# Patient Record
Sex: Male | Born: 1950 | Race: White | Hispanic: No | Marital: Married | Smoking: Never smoker
Health system: Southern US, Community
[De-identification: ages and names within clinical notes are randomized; demographics above are authoritative.]

## PROBLEM LIST (undated history)

## (undated) DIAGNOSIS — T4145XA Adverse effect of unspecified anesthetic, initial encounter: Secondary | ICD-10-CM

## (undated) DIAGNOSIS — E119 Type 2 diabetes mellitus without complications: Secondary | ICD-10-CM

## (undated) DIAGNOSIS — R251 Tremor, unspecified: Secondary | ICD-10-CM

## (undated) DIAGNOSIS — F419 Anxiety disorder, unspecified: Secondary | ICD-10-CM

## (undated) DIAGNOSIS — C801 Malignant (primary) neoplasm, unspecified: Secondary | ICD-10-CM

## (undated) DIAGNOSIS — T8859XA Other complications of anesthesia, initial encounter: Secondary | ICD-10-CM

## (undated) DIAGNOSIS — Z8489 Family history of other specified conditions: Secondary | ICD-10-CM

## (undated) DIAGNOSIS — F32A Depression, unspecified: Secondary | ICD-10-CM

## (undated) DIAGNOSIS — G629 Polyneuropathy, unspecified: Secondary | ICD-10-CM

## (undated) DIAGNOSIS — I2510 Atherosclerotic heart disease of native coronary artery without angina pectoris: Secondary | ICD-10-CM

## (undated) DIAGNOSIS — I10 Essential (primary) hypertension: Secondary | ICD-10-CM

## (undated) DIAGNOSIS — N186 End stage renal disease: Secondary | ICD-10-CM

## (undated) DIAGNOSIS — M1990 Unspecified osteoarthritis, unspecified site: Secondary | ICD-10-CM

## (undated) DIAGNOSIS — F329 Major depressive disorder, single episode, unspecified: Secondary | ICD-10-CM

## (undated) HISTORY — PX: CORONARY ARTERY BYPASS GRAFT: SHX141

## (undated) HISTORY — DX: Type 2 diabetes mellitus without complications: E11.9

## (undated) HISTORY — DX: Essential (primary) hypertension: I10

## (undated) HISTORY — DX: Polyneuropathy, unspecified: G62.9

## (undated) HISTORY — DX: Major depressive disorder, single episode, unspecified: F32.9

## (undated) HISTORY — PX: CARDIAC SURGERY: SHX584

## (undated) HISTORY — DX: Anxiety disorder, unspecified: F41.9

## (undated) HISTORY — DX: Depression, unspecified: F32.A

---

## 2007-02-22 ENCOUNTER — Encounter: Admission: RE | Admit: 2007-02-22 | Discharge: 2007-02-22 | Payer: Self-pay | Admitting: Family Medicine

## 2009-12-06 ENCOUNTER — Ambulatory Visit: Payer: Self-pay | Admitting: Cardiovascular Disease

## 2009-12-07 ENCOUNTER — Encounter: Payer: Self-pay | Admitting: Cardiovascular Disease

## 2009-12-07 ENCOUNTER — Ambulatory Visit: Payer: Self-pay | Admitting: Thoracic Surgery (Cardiothoracic Vascular Surgery)

## 2009-12-07 ENCOUNTER — Inpatient Hospital Stay (HOSPITAL_COMMUNITY): Admission: EM | Admit: 2009-12-07 | Discharge: 2009-12-16 | Payer: Self-pay | Admitting: Emergency Medicine

## 2009-12-08 ENCOUNTER — Ambulatory Visit: Payer: Self-pay | Admitting: Vascular Surgery

## 2009-12-11 ENCOUNTER — Encounter: Payer: Self-pay | Admitting: Internal Medicine

## 2010-01-03 ENCOUNTER — Encounter
Admission: RE | Admit: 2010-01-03 | Discharge: 2010-01-03 | Payer: Self-pay | Admitting: Thoracic Surgery (Cardiothoracic Vascular Surgery)

## 2010-01-03 ENCOUNTER — Encounter: Payer: Self-pay | Admitting: Internal Medicine

## 2010-01-03 ENCOUNTER — Ambulatory Visit: Payer: Self-pay | Admitting: Thoracic Surgery (Cardiothoracic Vascular Surgery)

## 2010-01-17 DIAGNOSIS — I2510 Atherosclerotic heart disease of native coronary artery without angina pectoris: Secondary | ICD-10-CM | POA: Insufficient documentation

## 2010-01-17 DIAGNOSIS — E1140 Type 2 diabetes mellitus with diabetic neuropathy, unspecified: Secondary | ICD-10-CM | POA: Insufficient documentation

## 2010-01-18 ENCOUNTER — Ambulatory Visit: Payer: Self-pay | Admitting: Internal Medicine

## 2010-01-18 ENCOUNTER — Encounter: Payer: Self-pay | Admitting: Internal Medicine

## 2010-01-18 DIAGNOSIS — I10 Essential (primary) hypertension: Secondary | ICD-10-CM | POA: Insufficient documentation

## 2010-01-18 DIAGNOSIS — E785 Hyperlipidemia, unspecified: Secondary | ICD-10-CM

## 2010-04-15 ENCOUNTER — Encounter: Payer: Self-pay | Admitting: Family Medicine

## 2010-04-24 NOTE — Assessment & Plan Note (Signed)
Summary: eph. post cabg/g d   Visit Type:  EPH Primary Odette Watanabe:  Joseph Mcdonald  CC:  CABG x 5  11/2009....chest soreness.....sob.Marland Kitchen..Marland Kitchenno edema.....  History of Present Illness: Patient is a 60 year old who I saw in the hosptial earlier this fall.  He presented to the ER with unstable angina.  He went on to cardiac cath that showed severe 3 v disease (LAD:  75%;  Diag x 2  with 99% lesions; AV groove art 100%.  OM1 filled via collaterals; RCA 99%  PDA/PLSA filled via collaterals).  He underwent CABG (LIMA to LAD; SVG to ramus/OM1; SVG to PDA, PLSA. SInce D/C he is recuperating fairly well.  He denies signif chest pains (only musculoskel).  Breathing is OK.    Current Medications (verified): 1)  Neurontin 800 Mg Tabs (Gabapentin) .Marland Kitchen.. 1 Tab Four Times Daily 2)  Lipitor 40 Mg Tabs (Atorvastatin Calcium) .Marland Kitchen.. 1 Tab At Bedtime 3)  Glucophage 1000 Mg Tabs (Metformin Hcl) .Marland Kitchen.. 1 Tab Two Times A Day 4)  Metoprolol Tartrate 25 Mg Tabs (Metoprolol Tartrate) .Marland Kitchen.. 1 Tab Two Times A Day 5)  Hydrochlorothiazide 25 Mg Tabs (Hydrochlorothiazide) .Marland Kitchen.. 1 Tab Once Daily 6)  Alprazolam 1 Mg Tabs (Alprazolam) .Marland Kitchen.. 1 Tab Three Times A Day 7)  Aspirin Ec 325 Mg Tbec (Aspirin) .... Take One Tablet By Mouth Daily  Allergies: 1)  ! Pcn  Past History:  Past medical, surgical, family and social histories (including risk factors) reviewed, and no changes noted (except as noted below).  Past Medical History: Reviewed history from 01/17/2010 and no changes required. Severe three-vessel coronary artery disease Insulin-dependent diabetes mellitus, diagnosis at age of 22 Hypertension Peripheral neuropathy Anxiety  Past Surgical History: Reviewed history from 01/17/2010 and no changes required. Coronary artery bypass grafting x5 (left internal mammary artery to       the LAD, saphenous vein graft sequentially to the ramus intermedius       and first obtuse marginal, sequential saphenous vein graft to the   posterior descending and posterolateral arteries)  Status post right rotator cuff repair Endoscopic vein harvest left leg  Family History: Reviewed history from 01/17/2010 and no changes required. Significant for MI father at a young age.  He had a brother who died at 30, another brother who had bypass surgery but is alive at 67.      Social History: Reviewed history from 01/17/2010 and no changes required. The patient lives in [place] with his wife.   He has three children and six grandchildren.  He is disabled from a landscaping business.   He denies any tobacco use.   He used to drink alcohol daily, but this indents 9 years ago.   The patient also admits to using marijuana, this helped his back pain. The patient denies any exercise, special diet or herbal medication use.      Review of Systems       All systems reviewed.  Neg to the above problem except as noted above.  Vital Signs:  Patient profile:   60 year old male Height:      64 inches Weight:      164.12 pounds BMI:     28.27 Pulse rate:   64 / minute Pulse rhythm:   irregular BP sitting:   184 / 100  (left arm) Cuff size:   regular  Vitals Entered By: Danielle Rankin, CMA (January 18, 2010 12:16 PM)  Physical Exam  Additional Exam:  Patient is in NAD HEENT:  Normocephalic, atraumatic. EOMI, PERRLA.  Neck: JVP is normal. No thyromegaly. No bruits.  Lungs: clear to auscultation. No rales no wheezes.  Chest:  Healing well. Heart: Regular rate and rhythm. Normal S1, S2. No S3.   No significant murmurs. PMI not displaced.  Abdomen:  Supple, nontender. Normal bowel sounds. No masses. No hepatomegaly.  Extremities:   Good distal pulses throughout. No lower extremity edema.  Musculoskeletal :moving all extremities.  Neuro:   alert and oriented x3.    EKG  Procedure date:  01/19/2010  Findings:      NSR.  PACs  Impression & Recommendations:  Problem # 1:  CAD (ICD-414.00) patient is doing well post CABG.   He is not interested in cardiac rehab.  Would like to do on own  Problem # 2:  HYPERLIPIDEMIA-MIXED (ICD-272.4) Will need to f/u lipids on this regimen.   His updated medication list for this problem includes:    Lipitor 40 Mg Tabs (Atorvastatin calcium) .Marland Kitchen... 1 tab at bedtime  Problem # 3:  HYPERTENSION, BENIGN (ICD-401.1) BP is out of control today.  He says that it is because he is in the doctor's office I have asked him to check his bp at home regularly and let us know.  He als needs to get bp cuff checked to make sure it is accurate I would not change meds today. His updated medication list for this problem includes:    Metoprolol Tartrate 25 Mg Tabs (Metoprolol tartrate) .Marland Kitchen... 1 tab two times a day    Hydrochlorothiazide 25 Mg Tabs (Hydrochlorothiazide) .Marland Kitchen... 1 tab once daily    Aspirin Ec 325 Mg Tbec (Aspirin) .Marland Kitchen... Take one tablet by mouth daily  Other Orders: EKG w/ Interpretation (93000) EKG w/ Interpretation (93000)  Patient Instructions: 1)  Your physician recommends that you schedule a follow-up appointment in: END OF JANUARY 2012 2)  Your physician recommends that you continue on your current medications as directed. Please refer to the Current Medication list given to you today. 3)  Your physician has requested that you regularly monitor and record your blood pressure readings at home.  Please use the same machine at the same time of day to check your readings and record them to bring to your follow-up visit.

## 2010-04-24 NOTE — Letter (Signed)
Summary: [facility] Office Visit   [facility] Office Visit   Imported By: Roderic Ovens 01/16/2010 14:18:29  _____________________________________________________________________  External Attachment:    Type:   Image     Comment:   External Document

## 2010-04-24 NOTE — Op Note (Signed)
Summary: [facility]  [facility]   Imported By: Marylou Mccoy 12/26/2009 09:34:45  _____________________________________________________________________  External Attachment:    Type:   Image     Comment:   External Document

## 2010-04-24 NOTE — Consult Note (Signed)
Summary: [facility]   [facility]   Imported By: Roderic Ovens 01/09/2010 14:51:02  _____________________________________________________________________  External Attachment:    Type:   Image     Comment:   External Document

## 2010-04-26 ENCOUNTER — Ambulatory Visit (INDEPENDENT_AMBULATORY_CARE_PROVIDER_SITE_OTHER): Payer: Medicaid Other | Admitting: Internal Medicine

## 2010-04-26 ENCOUNTER — Ambulatory Visit: Admit: 2010-04-26 | Payer: Self-pay | Admitting: Internal Medicine

## 2010-04-26 ENCOUNTER — Encounter: Payer: Self-pay | Admitting: Internal Medicine

## 2010-04-26 DIAGNOSIS — I2510 Atherosclerotic heart disease of native coronary artery without angina pectoris: Secondary | ICD-10-CM

## 2010-04-26 DIAGNOSIS — E7800 Pure hypercholesterolemia, unspecified: Secondary | ICD-10-CM

## 2010-05-02 NOTE — Assessment & Plan Note (Signed)
Summary: ROV/D.Miller   Vital Signs:  Patient profile:   60 year old male Height:      64 inches Weight:      173 pounds BMI:     29.80 Pulse rate:   56 / minute Pulse rhythm:   regular Resp:     18 per minute BP sitting:   120 / 80  (left arm) Cuff size:   large  Vitals Entered By: Vikki Ports (April 26, 2010 10:20 AM)  Visit Type:  Follow-up Primary Provider:  Randa Evens [facility]  CC:  No complaints.  History of Present Illness: Patient is a 60 year old who I saw in the hosptial earlier this fall.  He presented to the ER with unstable angina.  He went on to cardiac cath that showed severe 3 v disease (LAD:  75%;  Diag x 2  with 99% lesions; AV groove art 100%.  OM1 filled via collaterals; RCA 99%  PDA/PLSA filled via collaterals).  He underwent CABG (LIMA to LAD; SVG to ramus/OM1; SVG to PDA, PLSA. SInce I saw him he has done well.  He denies chest pain.  Breathing is good.  He had some LE swelling and his primary MD prescribed Lasix 20 to take as needed.   Current Medications (verified): 1)  Neurontin 800 Mg Tabs (Gabapentin) .Marland Kitchen.. 1 Tab Four Times Daily 2)  Lipitor 40 Mg Tabs (Atorvastatin Calcium) .Marland Kitchen.. 1 Tab At Bedtime 3)  Glucophage 1000 Mg Tabs (Metformin Hcl) .Marland Kitchen.. 1 Tab Two Times A Day 4)  Metoprolol Tartrate 25 Mg Tabs (Metoprolol Tartrate) .Marland Kitchen.. 1 Tab Daily 5)  Hydrochlorothiazide 25 Mg Tabs (Hydrochlorothiazide) .Marland Kitchen.. 1 Tab Once Daily 6)  Alprazolam 1 Mg Tabs (Alprazolam) .Marland Kitchen.. 1 Tab Three Times A Day 7)  Aspirin Ec 325 Mg Tbec (Aspirin) .... Take One Tablet By Mouth Daily 8)  Furosemide 20 Mg Tabs (Furosemide) .... Take One Tablet By Mouth Daily. As Needed 9)  Glipizide 10 Mg Xr24h-Tab (Glipizide) .... Take 1 Tablet By Mouth Two Times A Day  Allergies: 1)  ! Pcn  Past History:  Past medical, surgical, family and social histories (including risk factors) reviewed, and no changes noted (except as noted below).  Past Medical History: Reviewed history from 01/17/2010  and no changes required. Severe three-vessel coronary artery disease Insulin-dependent diabetes mellitus, diagnosis at age of 81 Hypertension Peripheral neuropathy Anxiety  Past Surgical History: Reviewed history from 01/17/2010 and no changes required. Coronary artery bypass grafting x5 (left internal mammary artery to       the LAD, saphenous vein graft sequentially to the ramus intermedius       and first obtuse marginal, sequential saphenous vein graft to the       posterior descending and posterolateral arteries)  Status post right rotator cuff repair Endoscopic vein harvest left leg  Family History: Reviewed history from 01/17/2010 and no changes required. Significant for MI father at a young age.  He had a brother who died at 60, another brother who had bypass surgery but is alive at 12.      Social History: Reviewed history from 01/17/2010 and no changes required. The patient lives in [place] with his wife.   He has three children and six grandchildren.  He is disabled from a landscaping business.   He denies any tobacco use.   He used to drink alcohol daily, but this indents 9 years ago.   The patient also admits to using marijuana, this helped his back pain. The  patient denies any exercise, special diet or herbal medication use.      Review of Systems       All systmes reviewed.  Neg to the above problem except as noted above.  Physical Exam  Additional Exam:  Patient is in NAD HEENT:  Normocephalic, atraumatic. EOMI, PERRLA.  Neck: JVP is normal. No thyromegaly. No bruits.  Lungs: clear to auscultation. No rales no wheezes.  Heart: Regular rate and rhythm. Normal S1, S2. No S3.   No significant murmurs. PMI not displaced.  Abdomen:  Supple, nontender. Normal bowel sounds. No masses. No hepatomegaly.  Extremities:   Good distal pulses throughout. trivial lower extremity edema.  Musculoskeletal :moving all extremities.  Neuro:   alert and oriented  x3.    Impression & Recommendations:  Problem # 1:  CAD (ICD-414.00) s/p CABG this fall  Doing well.  Keep on same regimen. I am not convinced of signif volume overload.  I think there is triv LE edema prob due to venous insuff.  Can always tie shoes.  Goes down in AM  Will check labs.  Encouraged him to use support socks. His updated medication list for this problem includes:    Metoprolol Tartrate 25 Mg Tabs (Metoprolol tartrate) .Marland Kitchen... 1 tab daily    Aspirin Ec 325 Mg Tbec (Aspirin) .Marland Kitchen... Take one tablet by mouth daily  Orders: EKG w/ Interpretation (93000) EKG w/ Interpretation (93000)  Problem # 2:  HYPERTENSION, BENIGN (ICD-401.1) Good control.  Check labs. His updated medication list for this problem includes:    Metoprolol Tartrate 25 Mg Tabs (Metoprolol tartrate) .Marland Kitchen... 1 tab daily    Hydrochlorothiazide 25 Mg Tabs (Hydrochlorothiazide) .Marland Kitchen... 1 tab once daily    Aspirin Ec 325 Mg Tbec (Aspirin) .Marland Kitchen... Take one tablet by mouth daily    Furosemide 20 Mg Tabs (Furosemide) .Marland Kitchen... Take one tablet by mouth daily. as needed  Problem # 3:  HYPERLIPIDEMIA-MIXED (ICD-272.4) Will check fasting lipids and AST His updated medication list for this problem includes:    Lipitor 40 Mg Tabs (Atorvastatin calcium) .Marland Kitchen... 1 tab at bedtime  New Orders:     1)  EKG w/ Interpretation (93000)     2)  EKG w/ Interpretation (93000)   Patient Instructions: 1)  Your physician recommends that you return for a FASTING lipid profile: lab work at [facility] office  lipid ast bnp bmet 414.0 272.2 2)  Your physician wants you to follow-up in:   August 2012 with Dr,Deisy Ozbun You will receive a reminder letter in the mail two months in advance. If you don't receive a letter, please call our office to schedule the follow-up appointment.   Orders Added: 1)  EKG w/ Interpretation [93000] 2)  EKG w/ Interpretation [93000]  Appended Document: ROV/D.Miller EKG:  Sinus bradycardia.   56 bpm. Nonspecific IVCD.

## 2010-06-07 LAB — CBC
HCT: 24.3 % — ABNORMAL LOW (ref 39.0–52.0)
HCT: 26.1 % — ABNORMAL LOW (ref 39.0–52.0)
HCT: 26.6 % — ABNORMAL LOW (ref 39.0–52.0)
HCT: 27 % — ABNORMAL LOW (ref 39.0–52.0)
HCT: 34.5 % — ABNORMAL LOW (ref 39.0–52.0)
HCT: 35.8 % — ABNORMAL LOW (ref 39.0–52.0)
HCT: 36.8 % — ABNORMAL LOW (ref 39.0–52.0)
Hemoglobin: 12.2 g/dL — ABNORMAL LOW (ref 13.0–17.0)
Hemoglobin: 12.5 g/dL — ABNORMAL LOW (ref 13.0–17.0)
Hemoglobin: 9.4 g/dL — ABNORMAL LOW (ref 13.0–17.0)
MCH: 30.4 pg (ref 26.0–34.0)
MCH: 30.5 pg (ref 26.0–34.0)
MCH: 30.7 pg (ref 26.0–34.0)
MCH: 30.8 pg (ref 26.0–34.0)
MCH: 31 pg (ref 26.0–34.0)
MCH: 31.2 pg (ref 26.0–34.0)
MCH: 31.5 pg (ref 26.0–34.0)
MCHC: 35.3 g/dL (ref 30.0–36.0)
MCHC: 35.4 g/dL (ref 30.0–36.0)
MCHC: 35.5 g/dL (ref 30.0–36.0)
MCHC: 36 g/dL (ref 30.0–36.0)
MCV: 86.4 fL (ref 78.0–100.0)
MCV: 86.5 fL (ref 78.0–100.0)
MCV: 87.2 fL (ref 78.0–100.0)
MCV: 87.7 fL (ref 78.0–100.0)
MCV: 88 fL (ref 78.0–100.0)
MCV: 89.3 fL (ref 78.0–100.0)
MCV: 89.4 fL (ref 78.0–100.0)
Platelets: 135 10*3/uL — ABNORMAL LOW (ref 150–400)
Platelets: 160 10*3/uL (ref 150–400)
Platelets: 177 10*3/uL (ref 150–400)
Platelets: 230 10*3/uL (ref 150–400)
Platelets: 232 10*3/uL (ref 150–400)
Platelets: 240 10*3/uL (ref 150–400)
RBC: 2.92 MIL/uL — ABNORMAL LOW (ref 4.22–5.81)
RBC: 2.99 MIL/uL — ABNORMAL LOW (ref 4.22–5.81)
RBC: 3.05 MIL/uL — ABNORMAL LOW (ref 4.22–5.81)
RBC: 3.08 MIL/uL — ABNORMAL LOW (ref 4.22–5.81)
RBC: 4.07 MIL/uL — ABNORMAL LOW (ref 4.22–5.81)
RDW: 12.7 % (ref 11.5–15.5)
RDW: 12.8 % (ref 11.5–15.5)
RDW: 12.8 % (ref 11.5–15.5)
RDW: 12.9 % (ref 11.5–15.5)
RDW: 13 % (ref 11.5–15.5)
RDW: 13.1 % (ref 11.5–15.5)
RDW: 13.1 % (ref 11.5–15.5)
RDW: 13.2 % (ref 11.5–15.5)
WBC: 12.9 10*3/uL — ABNORMAL HIGH (ref 4.0–10.5)
WBC: 14.4 10*3/uL — ABNORMAL HIGH (ref 4.0–10.5)
WBC: 6.2 10*3/uL (ref 4.0–10.5)
WBC: 7.1 10*3/uL (ref 4.0–10.5)
WBC: 8.4 10*3/uL (ref 4.0–10.5)
WBC: 9.7 10*3/uL (ref 4.0–10.5)

## 2010-06-07 LAB — BASIC METABOLIC PANEL
BUN: 12 mg/dL (ref 6–23)
BUN: 14 mg/dL (ref 6–23)
BUN: 14 mg/dL (ref 6–23)
BUN: 17 mg/dL (ref 6–23)
BUN: 17 mg/dL (ref 6–23)
CO2: 24 mEq/L (ref 19–32)
CO2: 24 mEq/L (ref 19–32)
CO2: 25 mEq/L (ref 19–32)
CO2: 28 mEq/L (ref 19–32)
CO2: 28 mEq/L (ref 19–32)
CO2: 29 mEq/L (ref 19–32)
CO2: 30 mEq/L (ref 19–32)
Calcium: 8.1 mg/dL — ABNORMAL LOW (ref 8.4–10.5)
Calcium: 8.5 mg/dL (ref 8.4–10.5)
Calcium: 9.1 mg/dL (ref 8.4–10.5)
Chloride: 100 mEq/L (ref 96–112)
Chloride: 102 mEq/L (ref 96–112)
Chloride: 102 mEq/L (ref 96–112)
Chloride: 104 mEq/L (ref 96–112)
Chloride: 108 mEq/L (ref 96–112)
Chloride: 112 mEq/L (ref 96–112)
Creatinine, Ser: 1.07 mg/dL (ref 0.4–1.5)
Creatinine, Ser: 1.24 mg/dL (ref 0.4–1.5)
Creatinine, Ser: 1.25 mg/dL (ref 0.4–1.5)
Creatinine, Ser: 1.38 mg/dL (ref 0.4–1.5)
GFR calc Af Amer: 50 mL/min — ABNORMAL LOW (ref >60)
GFR calc Af Amer: >60 mL/min (ref >60)
GFR calc Af Amer: >60 mL/min (ref >60)
GFR calc non Af Amer: 59 mL/min — ABNORMAL LOW (ref >60)
GFR calc non Af Amer: >60 mL/min (ref >60)
Glucose, Bld: 168 mg/dL — ABNORMAL HIGH (ref 70–99)
Glucose, Bld: 188 mg/dL — ABNORMAL HIGH (ref 70–99)
Glucose, Bld: 215 mg/dL — ABNORMAL HIGH (ref 70–99)
Glucose, Bld: 217 mg/dL — ABNORMAL HIGH (ref 70–99)
Potassium: 3.6 mEq/L (ref 3.5–5.1)
Potassium: 3.9 mEq/L (ref 3.5–5.1)
Potassium: 4 mEq/L (ref 3.5–5.1)
Potassium: 4.3 mEq/L (ref 3.5–5.1)
Sodium: 136 mEq/L (ref 135–145)
Sodium: 139 mEq/L (ref 135–145)
Sodium: 140 mEq/L (ref 135–145)

## 2010-06-07 LAB — POCT I-STAT 3, ART BLOOD GAS (G3+)
Acid-base deficit: 3 mmol/L — ABNORMAL HIGH (ref 0.0–2.0)
Acid-base deficit: 4 mmol/L — ABNORMAL HIGH (ref 0.0–2.0)
Bicarbonate: 22 mEq/L (ref 20.0–24.0)
Bicarbonate: 23.4 mEq/L (ref 20.0–24.0)
Bicarbonate: 26.5 mEq/L — ABNORMAL HIGH (ref 20.0–24.0)
Patient temperature: 36.9
TCO2: 25 mmol/L (ref 0–100)
pCO2 arterial: 40.3 mmHg (ref 35.0–45.0)
pCO2 arterial: 42.2 mmHg (ref 35.0–45.0)
pH, Arterial: 7.266 — ABNORMAL LOW (ref 7.350–7.450)
pH, Arterial: 7.314 — ABNORMAL LOW (ref 7.350–7.450)
pH, Arterial: 7.349 — ABNORMAL LOW (ref 7.350–7.450)
pH, Arterial: 7.352 (ref 7.350–7.450)
pH, Arterial: 7.373 (ref 7.350–7.450)
pO2, Arterial: 370 mmHg — ABNORMAL HIGH (ref 80.0–100.0)
pO2, Arterial: 460 mmHg — ABNORMAL HIGH (ref 80.0–100.0)
pO2, Arterial: 62 mmHg — ABNORMAL LOW (ref 80.0–100.0)

## 2010-06-07 LAB — CREATININE, SERUM
Creatinine, Ser: 1.22 mg/dL (ref 0.4–1.5)
GFR calc Af Amer: >60 mL/min (ref >60)
GFR calc non Af Amer: >60 mL/min (ref >60)

## 2010-06-07 LAB — COMPREHENSIVE METABOLIC PANEL
ALT: 14 U/L (ref 0–53)
AST: 17 U/L (ref 0–37)
Albumin: 4.1 g/dL (ref 3.5–5.2)
Alkaline Phosphatase: 50 U/L (ref 39–117)
BUN: 16 mg/dL (ref 6–23)
Chloride: 96 mEq/L (ref 96–112)
Potassium: 3.4 mEq/L — ABNORMAL LOW (ref 3.5–5.1)
Total Bilirubin: 0.3 mg/dL (ref 0.3–1.2)

## 2010-06-07 LAB — URINALYSIS, MICROSCOPIC ONLY
Glucose, UA: 100 mg/dL — AB
Hgb urine dipstick: NEGATIVE
Specific Gravity, Urine: 1.011 (ref 1.005–1.030)
pH: 5.5 (ref 5.0–8.0)

## 2010-06-07 LAB — GLUCOSE, CAPILLARY
Glucose-Capillary: 115 mg/dL — ABNORMAL HIGH (ref 70–99)
Glucose-Capillary: 123 mg/dL — ABNORMAL HIGH (ref 70–99)
Glucose-Capillary: 137 mg/dL — ABNORMAL HIGH (ref 70–99)
Glucose-Capillary: 141 mg/dL — ABNORMAL HIGH (ref 70–99)
Glucose-Capillary: 159 mg/dL — ABNORMAL HIGH (ref 70–99)
Glucose-Capillary: 171 mg/dL — ABNORMAL HIGH (ref 70–99)
Glucose-Capillary: 185 mg/dL — ABNORMAL HIGH (ref 70–99)
Glucose-Capillary: 189 mg/dL — ABNORMAL HIGH (ref 70–99)
Glucose-Capillary: 192 mg/dL — ABNORMAL HIGH (ref 70–99)
Glucose-Capillary: 194 mg/dL — ABNORMAL HIGH (ref 70–99)
Glucose-Capillary: 194 mg/dL — ABNORMAL HIGH (ref 70–99)
Glucose-Capillary: 199 mg/dL — ABNORMAL HIGH (ref 70–99)
Glucose-Capillary: 200 mg/dL — ABNORMAL HIGH (ref 70–99)
Glucose-Capillary: 201 mg/dL — ABNORMAL HIGH (ref 70–99)
Glucose-Capillary: 206 mg/dL — ABNORMAL HIGH (ref 70–99)
Glucose-Capillary: 215 mg/dL — ABNORMAL HIGH (ref 70–99)
Glucose-Capillary: 220 mg/dL — ABNORMAL HIGH (ref 70–99)
Glucose-Capillary: 86 mg/dL (ref 70–99)
Glucose-Capillary: 89 mg/dL (ref 70–99)

## 2010-06-07 LAB — URINALYSIS, ROUTINE W REFLEX MICROSCOPIC
Bilirubin Urine: NEGATIVE
Leukocytes, UA: NEGATIVE
Nitrite: NEGATIVE
Specific Gravity, Urine: 1.011 (ref 1.005–1.030)
pH: 8 (ref 5.0–8.0)

## 2010-06-07 LAB — POCT I-STAT 4, (NA,K, GLUC, HGB,HCT)
Glucose, Bld: 118 mg/dL — ABNORMAL HIGH (ref 70–99)
Glucose, Bld: 125 mg/dL — ABNORMAL HIGH (ref 70–99)
Glucose, Bld: 144 mg/dL — ABNORMAL HIGH (ref 70–99)
Glucose, Bld: 98 mg/dL (ref 70–99)
HCT: 25 % — ABNORMAL LOW (ref 39.0–52.0)
HCT: 26 % — ABNORMAL LOW (ref 39.0–52.0)
HCT: 30 % — ABNORMAL LOW (ref 39.0–52.0)
HCT: 32 % — ABNORMAL LOW (ref 39.0–52.0)
Hemoglobin: 10.9 g/dL — ABNORMAL LOW (ref 13.0–17.0)
Hemoglobin: 8.5 g/dL — ABNORMAL LOW (ref 13.0–17.0)
Hemoglobin: 8.8 g/dL — ABNORMAL LOW (ref 13.0–17.0)
Potassium: 3.4 mEq/L — ABNORMAL LOW (ref 3.5–5.1)
Potassium: 3.7 mEq/L (ref 3.5–5.1)
Potassium: 3.7 mEq/L (ref 3.5–5.1)
Potassium: 3.8 mEq/L (ref 3.5–5.1)
Potassium: 4.3 mEq/L (ref 3.5–5.1)
Sodium: 141 mEq/L (ref 135–145)

## 2010-06-07 LAB — CROSSMATCH
ABO/RH(D): A POS
Antibody Screen: NEGATIVE

## 2010-06-07 LAB — LIPID PANEL
Cholesterol: 144 mg/dL (ref 0–200)
HDL: 36 mg/dL — ABNORMAL LOW (ref >39)
Total CHOL/HDL Ratio: 4 RATIO
VLDL: 37 mg/dL (ref 0–40)

## 2010-06-07 LAB — MAGNESIUM: Magnesium: 2.7 mg/dL — ABNORMAL HIGH (ref 1.5–2.5)

## 2010-06-07 LAB — PLATELET COUNT: Platelets: 166 10*3/uL (ref 150–400)

## 2010-06-07 LAB — PROTIME-INR
INR: 0.93 (ref 0.00–1.49)
INR: 0.95 (ref 0.00–1.49)
INR: 0.95 (ref 0.00–1.49)
Prothrombin Time: 12.7 seconds (ref 11.6–15.2)

## 2010-06-07 LAB — POCT I-STAT, CHEM 8
Calcium, Ion: 1.03 mmol/L — ABNORMAL LOW (ref 1.12–1.32)
Creatinine, Ser: 1 mg/dL (ref 0.4–1.5)
Glucose, Bld: 210 mg/dL — ABNORMAL HIGH (ref 70–99)
Hemoglobin: 8.5 g/dL — ABNORMAL LOW (ref 13.0–17.0)
Potassium: 4.1 mEq/L (ref 3.5–5.1)
TCO2: 18 mmol/L (ref 0–100)

## 2010-06-07 LAB — MRSA PCR SCREENING: MRSA by PCR: NEGATIVE

## 2010-06-07 LAB — ABO/RH: ABO/RH(D): A POS

## 2010-06-07 LAB — APTT
aPTT: 104 seconds — ABNORMAL HIGH (ref 24–37)
aPTT: 37 seconds (ref 24–37)

## 2010-06-07 LAB — CULTURE, RESPIRATORY W GRAM STAIN

## 2010-06-07 LAB — URINE MICROSCOPIC-ADD ON

## 2010-06-07 LAB — CK TOTAL AND CKMB (NOT AT ARMC)
Relative Index: 1.9 (ref 0.0–2.5)
Total CK: 125 U/L (ref 7–232)

## 2010-06-07 LAB — HEMOGLOBIN A1C: Mean Plasma Glucose: 229 mg/dL — ABNORMAL HIGH (ref <117)

## 2010-06-07 LAB — TROPONIN I: Troponin I: 0.01 ng/mL (ref 0.00–0.06)

## 2010-06-07 LAB — BLOOD GAS, ARTERIAL
Acid-base deficit: 0.4 mmol/L (ref 0.0–2.0)
Bicarbonate: 24.1 mEq/L — ABNORMAL HIGH (ref 20.0–24.0)
Drawn by: 13898
O2 Saturation: 95.1 %
pO2, Arterial: 75.4 mmHg — ABNORMAL LOW (ref 80.0–100.0)

## 2010-06-07 LAB — HEPARIN LEVEL (UNFRACTIONATED)
Heparin Unfractionated: <0.1 IU/mL — ABNORMAL LOW (ref 0.30–0.70)
Heparin Unfractionated: <0.1 IU/mL — ABNORMAL LOW (ref 0.30–0.70)

## 2010-06-07 LAB — EXPECTORATED SPUTUM ASSESSMENT W GRAM STAIN, RFLX TO RESP C

## 2010-06-07 LAB — HEMOGLOBIN AND HEMATOCRIT, BLOOD
HCT: 24.4 % — ABNORMAL LOW (ref 39.0–52.0)
Hemoglobin: 8.6 g/dL — ABNORMAL LOW (ref 13.0–17.0)

## 2010-08-07 NOTE — Assessment & Plan Note (Signed)
OFFICE VISIT   Joseph Mcdonald, Joseph Mcdonald  DOB:  04/28/1950                                        January 03, 2010  CHART #:  [id number]   HISTORY:  The patient returns to the office day for followup visit after  recent coronary artery bypass grafting.  He had presented with an  unstable coronary syndrome in early September.  He underwent cardiac  catheterization and was found to have severe three-vessel coronary  artery disease.  A coronary bypass grafting x5 on September 19.  He had  diffusely diseased target vessels, but the operation went smoothly and  he was discharged home on postoperative day #5 without any major  complications.  He states since he has been home, he has been having to  take his pain meds about every 6 hours of course.  He said over the last  several days, he has been taking it more for sciatica pain than  incisional pain.  He did notice some swelling in his legs, a couple of  days ago, he took half of one of his wife's Lasix tablets and the  swelling resolved.  He has not had any anginal-type symptoms.  He has  had some trouble sleeping and his appetite has been poor.  He says his  sugars generally have been between 120 and 140, although on 2 mornings,  which were separated by several days, it was 211 on one time and 290 on  another time.   CURRENT MEDICATIONS:  Aspirin 325 mg daily, Lopressor 25 mg b.i.d.,  Crestor 20 mg at bedtime, metformin 500 mg b.i.d., glipizide 10 mg  b.i.d., hydrochlorothiazide 25 mg daily, Neurontin 800 mg q.i.d., Xanax  1 mg t.i.d. p.r.n., and Lortab 7.5/500 1-2 tablets every 6 hours as  needed.   ALLERGIES:  He is allergic to penicillin.   PHYSICAL EXAMINATION:  The patient is a 60 year old gentleman in no  acute distress.  His blood pressure is 150/80, pulse 58, respirations  16, his oxygen saturation is 97% on room air.  In general, he is a well-  developed, well-nourished in no acute distress.  His affect is  somewhat  flat, but he does engage in conversation appropriately and this is  basically consistent with his baseline.  When I first met him in the  hospital, his sternum is stable.  Sternal incision is well healed as are  his chest tube sites.  His leg incisions are healing well.  There is  minimal seroma in the right calf, there is no peripheral edema.  Lungs  are clear with equal breath sounds bilaterally.   Chest x-ray shows good aeration of the lungs bilaterally.  There is  prominent nipple shadow in the right base.   IMPRESSION:  The patient is a 60 year old gentleman.  He is now about 3  weeks out from coronary bypass grafting.  He is  recovering pretty well  at this point in time.  He is having a lot of trouble with back pain and  sciatica, which seems to be his most limiting factor, but he has been  walking on a daily basis.  He has an appointment to see Dr. Tenny Craw in  about 2 weeks, has not seen her yet.  I did advise him not to lift the  weight greater than 10 pounds for at least  another 3 weeks.  I advised  him not to drive for another 2 weeks, still he is a little more alert  and giving a little more energy.  I also advised him to wait a couple of  more weeks before trying to start cardiac rehab, so he will get more out  of it.  By and large, I think he is doing well and should continue to  improve over the next couple of months.  I would be happy to see him  again if I could be of any further assistance with his care.   Thank you very much.   Salvatore Decent Dorris Fetch, M.D.  Electronically Signed   SCH/MEDQ  D:  01/03/2010  T:  01/04/2010  Job:  [id number]   cc:   Pricilla Riffle, MD, [facility]  Glenetta Borg, MD

## 2010-09-06 ENCOUNTER — Other Ambulatory Visit: Payer: Self-pay | Admitting: Geriatric Medicine

## 2010-09-06 DIAGNOSIS — M545 Low back pain: Secondary | ICD-10-CM

## 2010-09-10 ENCOUNTER — Other Ambulatory Visit: Payer: Medicaid Other

## 2010-09-20 ENCOUNTER — Other Ambulatory Visit: Payer: Medicaid Other

## 2010-10-23 ENCOUNTER — Other Ambulatory Visit: Payer: Medicaid Other

## 2010-10-30 ENCOUNTER — Other Ambulatory Visit: Payer: Medicaid Other

## 2010-11-07 ENCOUNTER — Other Ambulatory Visit: Payer: Medicaid Other

## 2010-11-11 ENCOUNTER — Inpatient Hospital Stay: Admission: RE | Admit: 2010-11-11 | Payer: Medicaid Other | Source: Ambulatory Visit

## 2011-01-30 ENCOUNTER — Other Ambulatory Visit: Payer: Medicaid Other

## 2011-03-17 IMAGING — CR DG CHEST 2V
2 series · 2 of 2 positions shown · non-contrast
Comparison: 12/14/2009

CLINICAL DATA: Coronary artery disease status post CABG November 2009

CHEST - 2 VIEW

[w chest pa]
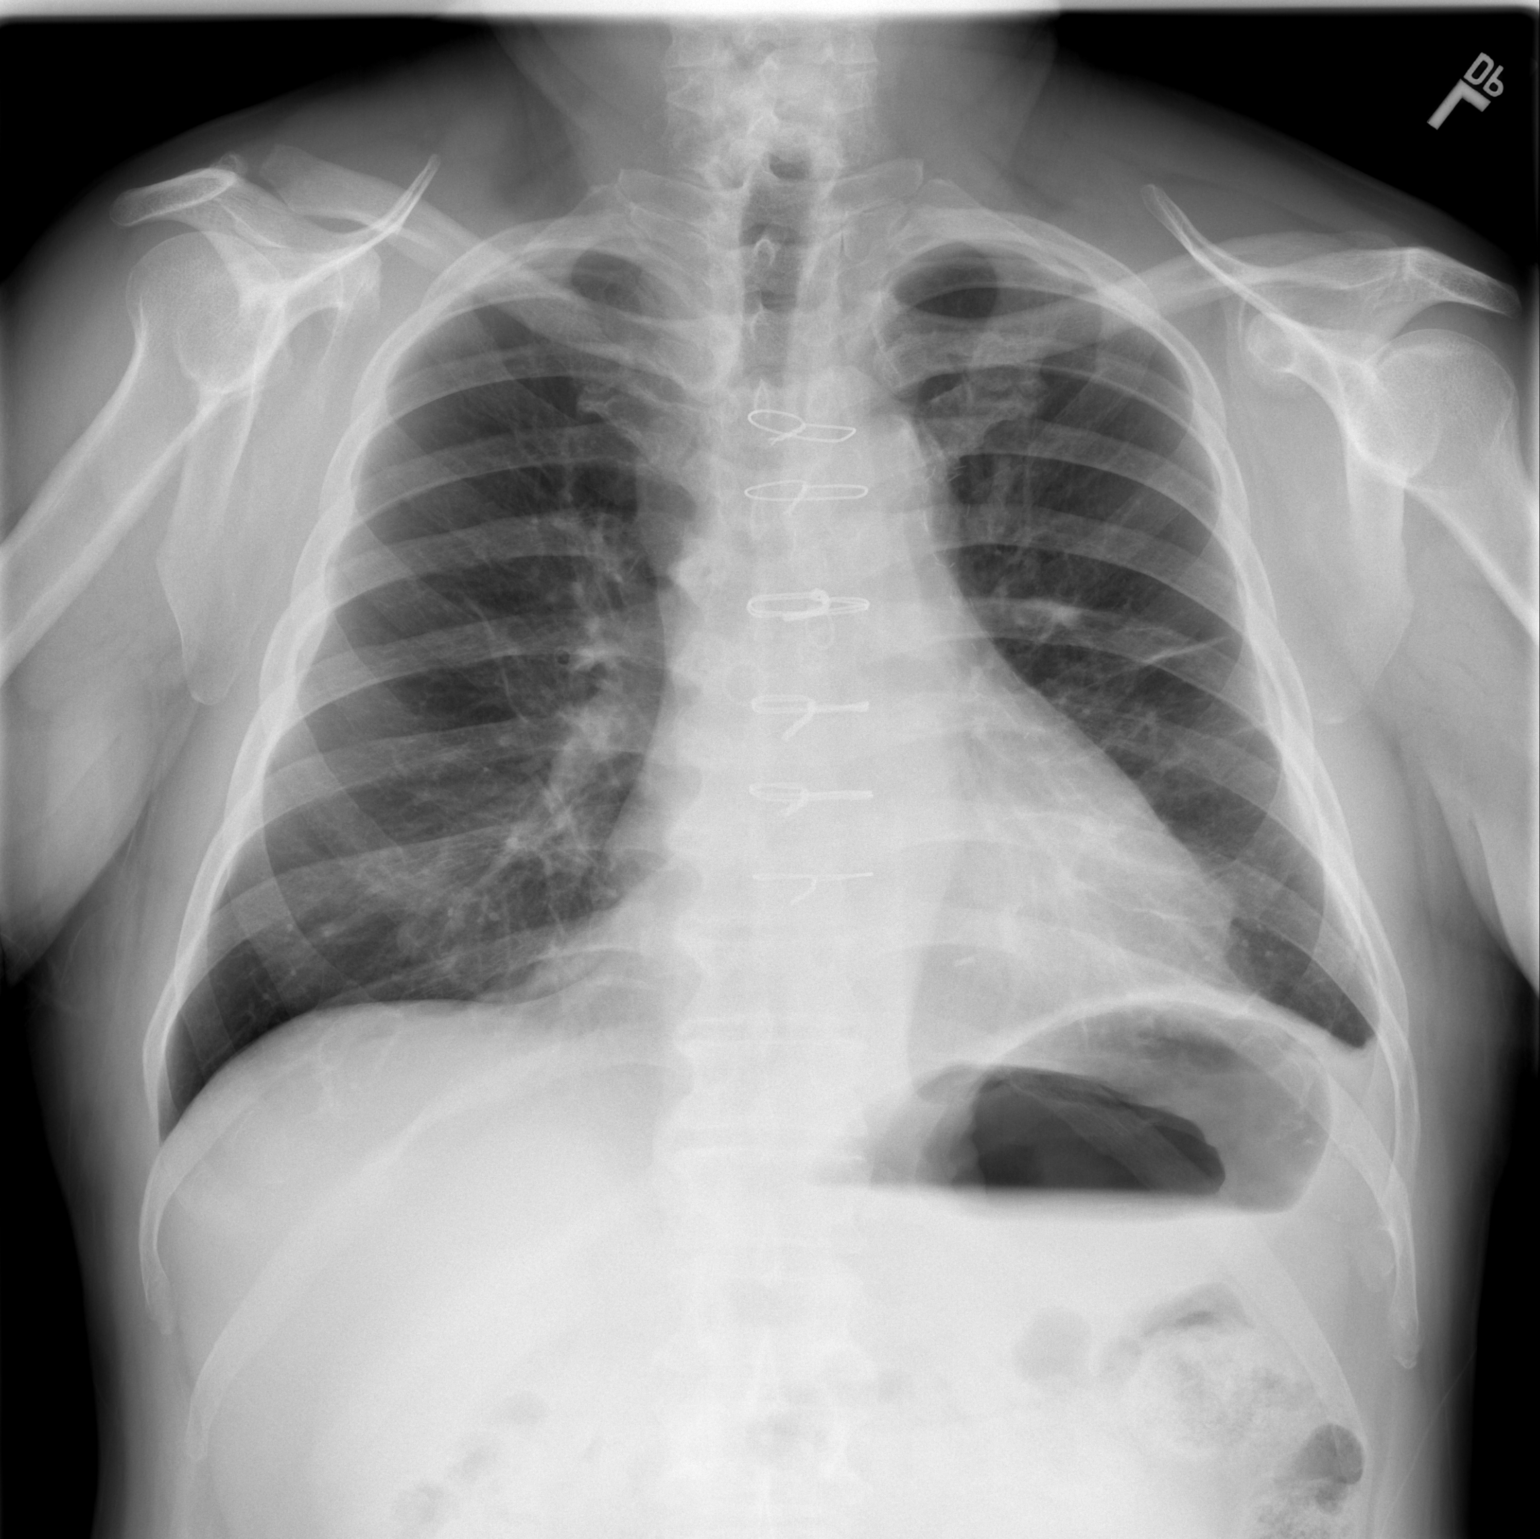

[w chest lat]
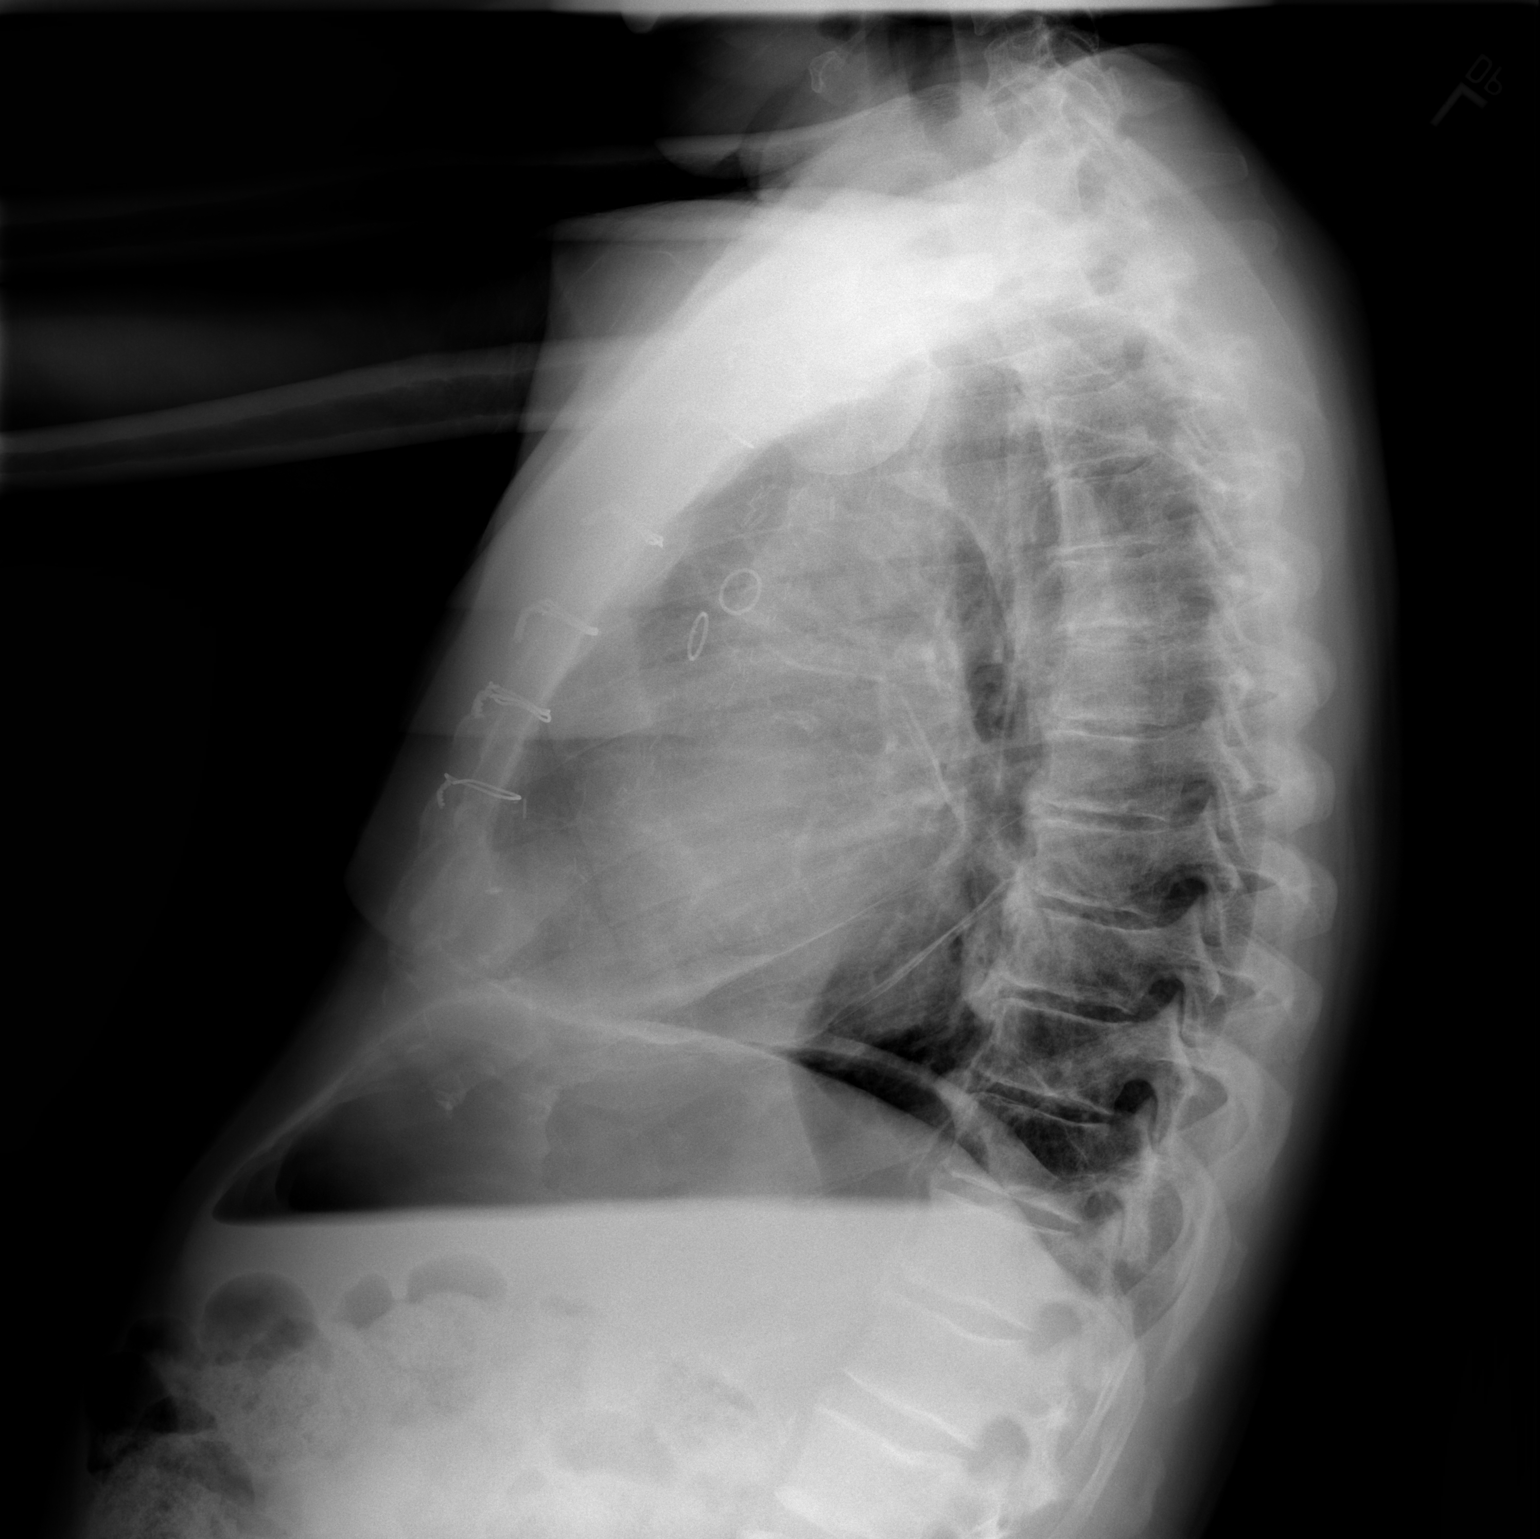

[2 of 2 positions shown; findings below may reference images not displayed]

FINDINGS: Enlargement of cardiac silhouette post CABG.
Atherosclerotic calcifications of mildly tortuous thoracic aorta.
Mediastinal contours and pulmonary vascularity otherwise normal.
Probable right nipple shadow.
Mild bronchitic changes with minimal subsegmental atelectasis at
mid to lower left lung.
Lungs otherwise clear.
Tiny left pleural effusion blunts costophrenic angle.
End plate spur formation thoracic spine.
No pneumothorax.
IMPRESSION: Mild enlargement of cardiac silhouette post CABG.
Minimal left basilar atelectasis and pleural effusion.
Probable right nipple shadow.

## 2012-11-19 ENCOUNTER — Ambulatory Visit: Payer: Medicaid Other | Admitting: Internal Medicine

## 2013-01-18 ENCOUNTER — Ambulatory Visit: Payer: Medicaid Other | Admitting: Internal Medicine

## 2013-02-08 ENCOUNTER — Ambulatory Visit: Payer: Medicaid Other | Admitting: Internal Medicine

## 2013-02-09 ENCOUNTER — Encounter: Payer: Self-pay | Admitting: Internal Medicine

## 2013-02-19 ENCOUNTER — Ambulatory Visit: Payer: Medicaid Other | Admitting: Internal Medicine

## 2013-03-01 ENCOUNTER — Encounter: Payer: Self-pay | Admitting: Internal Medicine

## 2013-03-01 ENCOUNTER — Ambulatory Visit (INDEPENDENT_AMBULATORY_CARE_PROVIDER_SITE_OTHER): Payer: Medicaid Other | Admitting: Internal Medicine

## 2013-03-01 VITALS — BP 130/80 | HR 58 | Ht 64.0 in | Wt 182.4 lb

## 2013-03-01 DIAGNOSIS — I25810 Atherosclerosis of coronary artery bypass graft(s) without angina pectoris: Secondary | ICD-10-CM

## 2013-03-01 DIAGNOSIS — I10 Essential (primary) hypertension: Secondary | ICD-10-CM

## 2013-03-01 LAB — BASIC METABOLIC PANEL
BUN: 49 mg/dL — ABNORMAL HIGH (ref 6–23)
Calcium: 9.2 mg/dL (ref 8.4–10.5)
Creatinine, Ser: 2.2 mg/dL — ABNORMAL HIGH (ref 0.4–1.5)
GFR: 32.92 mL/min — ABNORMAL LOW (ref >60.00)
Glucose, Bld: 246 mg/dL — ABNORMAL HIGH (ref 70–99)
Potassium: 5.4 mEq/L — ABNORMAL HIGH (ref 3.5–5.1)

## 2013-03-01 NOTE — Progress Notes (Signed)
HPIHistory of Present Illness:  Patient is a 62 year old who I saw in Feb 2012  CABG in Fall 2011. He presented to the ER with unstable angina. He went on to cardiac cath that showed severe 3 v disease (LAD: 75%; Diag x 2 with 99% lesions; AV groove art 100%. OM1 filled via collaterals; RCA 99% PDA/PLSA filled via collaterals). He underwent CABG (LIMA to LAD; SVG to ramus/OM1; SVG to PDA, PLSA.   No CP  Breathing is OK  No edema   Seen by Dr Allena Katz for kidneys  Told he has stage III CKD    Walking  All thorugh day     Allergies  Allergen Reactions  . Penicillins     Current Outpatient Prescriptions  Medication Sig Dispense Refill  . ALPRAZolam (XANAX) 1 MG tablet Take 1 mg by mouth 3 (three) times daily.       Marland Kitchen atorvastatin (LIPITOR) 40 MG tablet Take 80 mg by mouth daily.       Marland Kitchen gabapentin (NEURONTIN) 800 MG tablet Take 800 mg by mouth 2 (two) times daily.       Marland Kitchen glipiZIDE (GLUCOTROL) 10 MG tablet Take 10 mg by mouth 2 (two) times daily before a meal.      . hydrochlorothiazide (HYDRODIURIL) 25 MG tablet Take 25 mg by mouth daily.      . INSULIN GLULISINE IJ Inject as directed. Stated he is taking 2 different insulins but does not recall the names.      . OxyCODONE HCl (ROXICODONE PO) Take by mouth as directed.       No current facility-administered medications for this visit.    Past Medical History  Diagnosis Date  . Diabetes mellitus without complication   . Hypertension   . Peripheral neuropathy   . Anxiety   . Depression     History reviewed. No pertinent past surgical history.  Family History  Problem Relation Age of Onset  . Heart attack Father   . Heart attack Brother   . Heart attack Brother     History   Social History  . Marital Status: Married    Spouse Name: N/A    Number of Children: N/A  . Years of Education: N/A   Occupational History  . Not on file.   Social History Main Topics  . Smoking status: Never Smoker   . Smokeless tobacco: Not on  file  . Alcohol Use: No  . Drug Use: Not on file  . Sexual Activity: Not on file   Other Topics Concern  . Not on file   Social History Narrative   Pt lives with wife  .He  Has 13 children . HE is disabled from [place].    Review of Systems:  All systems reviewed.  They are negative to the above problem except as previously stated.  Vital Signs: BP 130/80  Pulse 58  Ht 5\' 4"  (1.626 m)  Wt 182 lb 6.4 oz (82.736 kg)  BMI 31.29 kg/m2  Physical Exam Patient is in NAD HEENT:  Normocephalic, atraumatic. EOMI, PERRLA.  Neck: JVP is normal.  No bruits.  Lungs: clear to auscultation. No rales no wheezes.  Heart: Regular rate and rhythm. Normal S1, S2. No S3.   No significant murmurs. PMI not displaced.  Abdomen:  Supple, nontender. Normal bowel sounds. No masses. No hepatomegaly.  Extremities:   Good distal pulses throughout. No lower extremity edema.  Musculoskeletal :moving all extremities.  Neuro:   alert and oriented  x3.  CN II-XII grossly intact.  EKG  SB 52  First degree block  228 mdrv   Assessment and Plan:  1.  CAD No sympotms to suggest angina  Keep on current regimen 2.  Lipid  Get frim primary MD 3.  CKD  Patient upset  Told he has stage III CKD  Will get records from Dr Allena Katz  Get BMET today 4.  DM  Pt f/u with primary md  F/U in 1 year

## 2013-03-01 NOTE — Patient Instructions (Addendum)
Your physician wants you to follow-up in: 1 yr with Dr Tenny Craw. You will receive a reminder letter in the mail two months in advance. If you don't receive a letter, please call our office to schedule the follow-up appointment.  Your physician recommends that you return for lab work in: today

## 2013-03-23 ENCOUNTER — Encounter: Payer: Self-pay | Admitting: *Deleted

## 2013-04-13 ENCOUNTER — Telehealth: Payer: Self-pay | Admitting: Internal Medicine

## 2013-04-13 NOTE — Telephone Encounter (Signed)
New Problem:  Pt's wife  is calling to hear her husband's recent test results

## 2013-04-13 NOTE — Telephone Encounter (Signed)
Spoke with pt wife, aware labs from dec show elevated potassium. Script mailed to pt for labs to be rechecked in [place].

## 2015-02-08 DIAGNOSIS — E1122 Type 2 diabetes mellitus with diabetic chronic kidney disease: Secondary | ICD-10-CM

## 2015-02-08 DIAGNOSIS — I129 Hypertensive chronic kidney disease with stage 1 through stage 4 chronic kidney disease, or unspecified chronic kidney disease: Secondary | ICD-10-CM

## 2015-02-08 DIAGNOSIS — D631 Anemia in chronic kidney disease: Secondary | ICD-10-CM

## 2015-02-08 DIAGNOSIS — N189 Chronic kidney disease, unspecified: Secondary | ICD-10-CM

## 2015-10-11 ENCOUNTER — Inpatient Hospital Stay (HOSPITAL_COMMUNITY)
Admission: EM | Admit: 2015-10-11 | Discharge: 2015-10-13 | DRG: 897 | Disposition: A | Discharge status: Home / Self Care | Payer: Medicare Other | Attending: Family Medicine | Admitting: Family Medicine

## 2015-10-11 ENCOUNTER — Encounter (HOSPITAL_COMMUNITY): Payer: Self-pay

## 2015-10-11 DIAGNOSIS — I129 Hypertensive chronic kidney disease with stage 1 through stage 4 chronic kidney disease, or unspecified chronic kidney disease: Secondary | ICD-10-CM | POA: Diagnosis present

## 2015-10-11 DIAGNOSIS — G8929 Other chronic pain: Secondary | ICD-10-CM | POA: Diagnosis present

## 2015-10-11 DIAGNOSIS — F329 Major depressive disorder, single episode, unspecified: Secondary | ICD-10-CM | POA: Diagnosis present

## 2015-10-11 DIAGNOSIS — F10932 Alcohol use, unspecified with withdrawal with perceptual disturbance: Secondary | ICD-10-CM

## 2015-10-11 DIAGNOSIS — Z951 Presence of aortocoronary bypass graft: Secondary | ICD-10-CM | POA: Diagnosis not present

## 2015-10-11 DIAGNOSIS — I2510 Atherosclerotic heart disease of native coronary artery without angina pectoris: Secondary | ICD-10-CM | POA: Diagnosis present

## 2015-10-11 DIAGNOSIS — R1030 Lower abdominal pain, unspecified: Secondary | ICD-10-CM | POA: Insufficient documentation

## 2015-10-11 DIAGNOSIS — N179 Acute kidney failure, unspecified: Secondary | ICD-10-CM | POA: Diagnosis present

## 2015-10-11 DIAGNOSIS — I10 Essential (primary) hypertension: Secondary | ICD-10-CM | POA: Diagnosis present

## 2015-10-11 DIAGNOSIS — M549 Dorsalgia, unspecified: Secondary | ICD-10-CM | POA: Diagnosis present

## 2015-10-11 DIAGNOSIS — Z79899 Other long term (current) drug therapy: Secondary | ICD-10-CM | POA: Diagnosis not present

## 2015-10-11 DIAGNOSIS — E1122 Type 2 diabetes mellitus with diabetic chronic kidney disease: Secondary | ICD-10-CM | POA: Diagnosis present

## 2015-10-11 DIAGNOSIS — R112 Nausea with vomiting, unspecified: Secondary | ICD-10-CM

## 2015-10-11 DIAGNOSIS — F10232 Alcohol dependence with withdrawal with perceptual disturbance: Secondary | ICD-10-CM

## 2015-10-11 DIAGNOSIS — F10239 Alcohol dependence with withdrawal, unspecified: Principal | ICD-10-CM | POA: Diagnosis present

## 2015-10-11 DIAGNOSIS — F419 Anxiety disorder, unspecified: Secondary | ICD-10-CM | POA: Diagnosis present

## 2015-10-11 DIAGNOSIS — N183 Chronic kidney disease, stage 3 unspecified: Secondary | ICD-10-CM | POA: Diagnosis present

## 2015-10-11 DIAGNOSIS — R809 Proteinuria, unspecified: Secondary | ICD-10-CM | POA: Diagnosis present

## 2015-10-11 DIAGNOSIS — E1365 Other specified diabetes mellitus with hyperglycemia: Secondary | ICD-10-CM

## 2015-10-11 DIAGNOSIS — E1322 Other specified diabetes mellitus with diabetic chronic kidney disease: Secondary | ICD-10-CM

## 2015-10-11 DIAGNOSIS — E1142 Type 2 diabetes mellitus with diabetic polyneuropathy: Secondary | ICD-10-CM | POA: Diagnosis present

## 2015-10-11 DIAGNOSIS — Z794 Long term (current) use of insulin: Secondary | ICD-10-CM

## 2015-10-11 DIAGNOSIS — Z7982 Long term (current) use of aspirin: Secondary | ICD-10-CM

## 2015-10-11 DIAGNOSIS — IMO0002 Reserved for concepts with insufficient information to code with codable children: Secondary | ICD-10-CM

## 2015-10-11 DIAGNOSIS — E785 Hyperlipidemia, unspecified: Secondary | ICD-10-CM | POA: Diagnosis present

## 2015-10-11 DIAGNOSIS — N289 Disorder of kidney and ureter, unspecified: Secondary | ICD-10-CM | POA: Insufficient documentation

## 2015-10-11 DIAGNOSIS — N184 Chronic kidney disease, stage 4 (severe): Secondary | ICD-10-CM

## 2015-10-11 DIAGNOSIS — R1110 Vomiting, unspecified: Secondary | ICD-10-CM | POA: Insufficient documentation

## 2015-10-11 DIAGNOSIS — F10939 Alcohol use, unspecified with withdrawal, unspecified: Secondary | ICD-10-CM | POA: Diagnosis present

## 2015-10-11 LAB — URINE MICROSCOPIC-ADD ON

## 2015-10-11 LAB — CBC WITH DIFFERENTIAL/PLATELET
BASOS ABS: 0 10*3/uL (ref 0.0–0.1)
BASOS PCT: 0 %
EOS PCT: 5 %
Eosinophils Absolute: 0.3 10*3/uL (ref 0.0–0.7)
HCT: 33.2 % — ABNORMAL LOW (ref 39.0–52.0)
Hemoglobin: 12.1 g/dL — ABNORMAL LOW (ref 13.0–17.0)
Lymphocytes Relative: 12 %
Lymphs Abs: 0.9 10*3/uL (ref 0.7–4.0)
MCH: 31.8 pg (ref 26.0–34.0)
MCHC: 36.4 g/dL — ABNORMAL HIGH (ref 30.0–36.0)
MCV: 87.1 fL (ref 78.0–100.0)
MONO ABS: 0.6 10*3/uL (ref 0.1–1.0)
Monocytes Relative: 7 %
Neutro Abs: 5.7 10*3/uL (ref 1.7–7.7)
Neutrophils Relative %: 76 %
PLATELETS: 252 10*3/uL (ref 150–400)
RBC: 3.81 MIL/uL — ABNORMAL LOW (ref 4.22–5.81)
RDW: 13.2 % (ref 11.5–15.5)
WBC: 7.5 10*3/uL (ref 4.0–10.5)

## 2015-10-11 LAB — COMPREHENSIVE METABOLIC PANEL
ALT: 20 U/L (ref 17–63)
AST: 22 U/L (ref 15–41)
Albumin: 3.6 g/dL (ref 3.5–5.0)
Alkaline Phosphatase: 62 U/L (ref 38–126)
Anion gap: 9 (ref 5–15)
BILIRUBIN TOTAL: 0.7 mg/dL (ref 0.3–1.2)
BUN: 18 mg/dL (ref 6–20)
CO2: 21 mmol/L — ABNORMAL LOW (ref 22–32)
CREATININE: 2.41 mg/dL — AB (ref 0.61–1.24)
Calcium: 8.2 mg/dL — ABNORMAL LOW (ref 8.9–10.3)
Chloride: 106 mmol/L (ref 101–111)
GFR calc Af Amer: 31 mL/min — ABNORMAL LOW (ref >60)
GFR, EST NON AFRICAN AMERICAN: 26 mL/min — AB (ref >60)
Glucose, Bld: 223 mg/dL — ABNORMAL HIGH (ref 65–99)
POTASSIUM: 3.3 mmol/L — AB (ref 3.5–5.1)
Sodium: 136 mmol/L (ref 135–145)
TOTAL PROTEIN: 6.3 g/dL — AB (ref 6.5–8.1)

## 2015-10-11 LAB — RAPID URINE DRUG SCREEN, HOSP PERFORMED
Amphetamines: NOT DETECTED
BARBITURATES: NOT DETECTED
Benzodiazepines: NOT DETECTED
COCAINE: NOT DETECTED
Opiates: POSITIVE — AB
TETRAHYDROCANNABINOL: NOT DETECTED

## 2015-10-11 LAB — GLUCOSE, CAPILLARY
GLUCOSE-CAPILLARY: 158 mg/dL — AB (ref 65–99)
Glucose-Capillary: 205 mg/dL — ABNORMAL HIGH (ref 65–99)

## 2015-10-11 LAB — URINALYSIS, ROUTINE W REFLEX MICROSCOPIC
Bilirubin Urine: NEGATIVE
GLUCOSE, UA: 500 mg/dL — AB
KETONES UR: NEGATIVE mg/dL
LEUKOCYTES UA: NEGATIVE
NITRITE: NEGATIVE
Specific Gravity, Urine: 1.013 (ref 1.005–1.030)
pH: 6.5 (ref 5.0–8.0)

## 2015-10-11 LAB — ETHANOL: Alcohol, Ethyl (B): <5 mg/dL (ref <5)

## 2015-10-11 LAB — LIPASE, BLOOD: LIPASE: 89 U/L — AB (ref 11–51)

## 2015-10-11 MED ORDER — AMLODIPINE BESYLATE 5 MG PO TABS
5.0000 mg | ORAL_TABLET | Freq: Every day | ORAL | Status: DC
  Administered 2015-10-11 – 2015-10-13 (×3): 5 mg via ORAL
  Filled 2015-10-11 (×3): qty 1

## 2015-10-11 MED ORDER — ENOXAPARIN SODIUM 30 MG/0.3ML ~~LOC~~ SOLN
30.0000 mg | SUBCUTANEOUS | Status: DC
  Administered 2015-10-11 – 2015-10-12 (×2): 30 mg via SUBCUTANEOUS
  Filled 2015-10-11 (×2): qty 0.3

## 2015-10-11 MED ORDER — LORAZEPAM 2 MG/ML IJ SOLN
2.0000 mg | INTRAMUSCULAR | Status: DC | PRN
  Administered 2015-10-12: 2 mg via INTRAVENOUS
  Filled 2015-10-11: qty 1

## 2015-10-11 MED ORDER — LORAZEPAM 2 MG/ML IJ SOLN
2.0000 mg | Freq: Once | INTRAMUSCULAR | Status: AC
Start: 2015-10-11 — End: 2015-10-11
  Administered 2015-10-11: 2 mg via INTRAVENOUS
  Filled 2015-10-11: qty 1

## 2015-10-11 MED ORDER — GABAPENTIN 800 MG PO TABS
800.0000 mg | ORAL_TABLET | Freq: Two times a day (BID) | ORAL | Status: DC
  Filled 2015-10-11: qty 1

## 2015-10-11 MED ORDER — INSULIN DETEMIR 100 UNIT/ML ~~LOC~~ SOLN
30.0000 [IU] | Freq: Every day | SUBCUTANEOUS | Status: DC
  Administered 2015-10-11: 30 [IU] via SUBCUTANEOUS
  Filled 2015-10-11 (×3): qty 0.3

## 2015-10-11 MED ORDER — ACETAMINOPHEN 325 MG PO TABS
650.0000 mg | ORAL_TABLET | ORAL | Status: DC | PRN
  Administered 2015-10-11 – 2015-10-12 (×2): 650 mg via ORAL
  Filled 2015-10-11: qty 2

## 2015-10-11 MED ORDER — INSULIN ASPART 100 UNIT/ML ~~LOC~~ SOLN
0.0000 [IU] | Freq: Three times a day (TID) | SUBCUTANEOUS | Status: DC
Start: 2015-10-11 — End: 2015-10-13
  Administered 2015-10-11: 3 [IU] via SUBCUTANEOUS
  Administered 2015-10-12: 5 [IU] via SUBCUTANEOUS
  Administered 2015-10-12: 2 [IU] via SUBCUTANEOUS
  Administered 2015-10-13: 9 [IU] via SUBCUTANEOUS
  Administered 2015-10-13: 3 [IU] via SUBCUTANEOUS

## 2015-10-11 MED ORDER — FOLIC ACID 1 MG PO TABS
1.0000 mg | ORAL_TABLET | Freq: Every day | ORAL | Status: DC
  Administered 2015-10-11 – 2015-10-13 (×3): 1 mg via ORAL
  Filled 2015-10-11 (×3): qty 1

## 2015-10-11 MED ORDER — INSULIN ASPART 100 UNIT/ML ~~LOC~~ SOLN
0.0000 [IU] | Freq: Every day | SUBCUTANEOUS | Status: DC
Start: 2015-10-11 — End: 2015-10-13
  Administered 2015-10-11: 2 [IU] via SUBCUTANEOUS

## 2015-10-11 MED ORDER — ONDANSETRON HCL 4 MG/2ML IJ SOLN
4.0000 mg | Freq: Once | INTRAMUSCULAR | Status: AC
  Administered 2015-10-11: 4 mg via INTRAVENOUS
  Filled 2015-10-11: qty 2

## 2015-10-11 MED ORDER — EZETIMIBE 10 MG PO TABS
10.0000 mg | ORAL_TABLET | Freq: Every day | ORAL | Status: DC
  Administered 2015-10-11 – 2015-10-13 (×3): 10 mg via ORAL
  Filled 2015-10-11 (×3): qty 1

## 2015-10-11 MED ORDER — METOCLOPRAMIDE HCL 5 MG/ML IJ SOLN
10.0000 mg | Freq: Once | INTRAMUSCULAR | Status: AC
  Administered 2015-10-11: 10 mg via INTRAVENOUS
  Filled 2015-10-11: qty 2

## 2015-10-11 MED ORDER — LORAZEPAM 1 MG PO TABS
2.0000 mg | ORAL_TABLET | Freq: Two times a day (BID) | ORAL | Status: DC
  Administered 2015-10-11 – 2015-10-12 (×3): 2 mg via ORAL
  Filled 2015-10-11 (×4): qty 2

## 2015-10-11 MED ORDER — MORPHINE SULFATE (PF) 4 MG/ML IV SOLN
4.0000 mg | Freq: Once | INTRAVENOUS | Status: AC
  Administered 2015-10-11: 4 mg via INTRAVENOUS
  Filled 2015-10-11: qty 1

## 2015-10-11 MED ORDER — SODIUM CHLORIDE 0.9 % IV BOLUS (SEPSIS)
1000.0000 mL | Freq: Once | INTRAVENOUS | Status: AC
  Administered 2015-10-11: 1000 mL via INTRAVENOUS

## 2015-10-11 MED ORDER — ATORVASTATIN CALCIUM 80 MG PO TABS
80.0000 mg | ORAL_TABLET | Freq: Every day | ORAL | Status: DC
  Administered 2015-10-11 – 2015-10-13 (×3): 80 mg via ORAL
  Filled 2015-10-11 (×3): qty 1

## 2015-10-11 MED ORDER — ASPIRIN EC 81 MG PO TBEC
81.0000 mg | DELAYED_RELEASE_TABLET | Freq: Every day | ORAL | Status: DC
  Administered 2015-10-11 – 2015-10-13 (×3): 81 mg via ORAL
  Filled 2015-10-11 (×3): qty 1

## 2015-10-11 MED ORDER — VITAMIN B-1 100 MG PO TABS
100.0000 mg | ORAL_TABLET | Freq: Every day | ORAL | Status: DC
  Administered 2015-10-11 – 2015-10-13 (×3): 100 mg via ORAL
  Filled 2015-10-11 (×3): qty 1

## 2015-10-11 MED ORDER — LORAZEPAM 1 MG PO TABS
2.0000 mg | ORAL_TABLET | Freq: Once | ORAL | Status: AC
  Administered 2015-10-11: 2 mg via ORAL
  Filled 2015-10-11: qty 2

## 2015-10-11 MED ORDER — GABAPENTIN 400 MG PO CAPS
800.0000 mg | ORAL_CAPSULE | Freq: Two times a day (BID) | ORAL | Status: DC
  Administered 2015-10-11 – 2015-10-13 (×4): 800 mg via ORAL
  Filled 2015-10-11 (×4): qty 2

## 2015-10-11 MED ORDER — LORAZEPAM 2 MG/ML IJ SOLN: 0.0000 mg | Freq: Four times a day (QID) | INTRAMUSCULAR | Status: DC

## 2015-10-11 MED ORDER — POTASSIUM CHLORIDE CRYS ER 20 MEQ PO TBCR
40.0000 meq | EXTENDED_RELEASE_TABLET | Freq: Once | ORAL | Status: AC
  Administered 2015-10-11: 40 meq via ORAL
  Filled 2015-10-11: qty 2

## 2015-10-11 MED ORDER — SODIUM CHLORIDE 0.9% FLUSH
3.0000 mL | Freq: Two times a day (BID) | INTRAVENOUS | Status: DC
  Administered 2015-10-12: 3 mL via INTRAVENOUS

## 2015-10-11 MED ORDER — SODIUM CHLORIDE 0.9 % IV SOLN
INTRAVENOUS | Status: AC
  Administered 2015-10-11: 16:00:00 via INTRAVENOUS

## 2015-10-11 MED ORDER — LORAZEPAM 2 MG/ML IJ SOLN: 0.0000 mg | Freq: Two times a day (BID) | INTRAMUSCULAR | Status: DC

## 2015-10-11 NOTE — Progress Notes (Signed)
Attempted to give report. RN unavailable.  

## 2015-10-11 NOTE — ED Notes (Signed)
Pt presents with onset of nausea and vomiting that began yesterday.  Pt unable to keep broth down, has had constant nausea and dry heaves.  CB: 183, pt hypertensive: 210/100; has not been able to keep medication down. Reports generalized abdominal pain and bilateral flank pain.

## 2015-10-11 NOTE — ED Provider Notes (Signed)
CSN: [id number]     Arrival date & time 10/11/15  [id number] History   First MD Initiated Contact with Patient 10/11/15 0914     Chief Complaint  Patient presents with  . Emesis    HPI   Joseph Mcdonald is an 65 y.o. male with history of EtOH abuse, DM, HTN, anxiety, depression who presents to the ED for evaluation of abdominal pain with nausea and vomiting. He states for since yesterday he has had diffuse sharp, stabbing, constant abdominal pain with associated nausea and vomiting. He states he has been constantly nauseated with 12+ episodes of NBNB emesis. Denies diarrhea. States he has been unable to tolerate anything PO. He also notes that he has had diffuse low back  pain for over one year so his back pain today is not new. Denies dysuria or urinary frequency/urgency. Denies gross hematuria. He does admit to drinking "too much" recently but is unable or unwilling to clarify.  Pt's wife is in the room later and helps provide some history. Pt has apparently been on xanax and oxycodone IR for his chronic back pain and anxiety. However, over the past month pt's doctor discontinued these medications. In an effort to compensate pt has been binge drinking more than he had been. His wife states years ago he was a daily drinker but prior to the past month he had not been drinking regularly. Over the last month pt has been drinking "a lot" of beer and liquor. Neither he nor his wife are able to quantify how much. He has not had any EtOH the past week.  Past Medical History  Diagnosis Date  . Diabetes mellitus without complication   . Hypertension   . Peripheral neuropathy   . Anxiety   . Depression    Past Surgical History  Procedure Laterality Date  . Coronary artery bypass graft     Family History  Problem Relation Age of Onset  . Heart attack Father   . Heart attack Brother   . Heart attack Brother    Social History  Substance Use Topics  . Smoking status: Never Smoker   .  Smokeless tobacco: Not on file  . Alcohol Use: No    Review of Systems  All other systems reviewed and are negative.     Allergies  Penicillins  Home Medications   Prior to Admission medications   Medication Sig Start Date End Date Taking? Authorizing Provider  ALPRAZolam Duanne Moron) 1 MG tablet Take 1 mg by mouth 3 (three) times daily.     Historical Provider, MD  aspirin 81 MG tablet Take 1 tablet (81 mg total) by mouth daily. 03/01/13   Fay Records, MD  atorvastatin (LIPITOR) 40 MG tablet Take 80 mg by mouth daily.     Historical Provider, MD  gabapentin (NEURONTIN) 800 MG tablet Take 800 mg by mouth 2 (two) times daily.     Historical Provider, MD  glipiZIDE (GLUCOTROL) 10 MG tablet Take 10 mg by mouth 2 (two) times daily before a meal.    Historical Provider, MD  hydrochlorothiazide (HYDRODIURIL) 25 MG tablet Take 25 mg by mouth daily.    Historical Provider, MD  INSULIN GLULISINE IJ Inject as directed. Stated he is taking 2 different insulins but does not recall the names.    Historical Provider, MD  OxyCODONE HCl (ROXICODONE PO) Take by mouth as directed.    Historical Provider, MD   BP 185/92 mmHg  Pulse 81  Temp(Src) 98.4 F (36.9  C) (Oral)  Resp 16  Ht 5\' 4"  (1.626 m)  Wt 77.111 kg  BMI 29.17 kg/m2  SpO2 98% Physical Exam  Constitutional: He is oriented to person, place, and time.  Appears uncomfortable. Mildly tremulous.  HENT:  Right Ear: External ear normal.  Left Ear: External ear normal.  Nose: Nose normal.  Mouth/Throat: Oropharynx is clear and moist. No oropharyngeal exudate.  Eyes: Conjunctivae and EOM are normal. Pupils are equal, round, and reactive to light.  Neck: Normal range of motion. Neck supple.  Cardiovascular: Normal rate, regular rhythm, normal heart sounds and intact distal pulses.   Pulmonary/Chest: Effort normal and breath sounds normal. No respiratory distress. He has no wheezes. He exhibits tenderness.  Abdominal: Soft. Bowel sounds are  normal. He exhibits no distension. There is tenderness. There is no rebound and no guarding.  Abdomen diffusely tender to palpation. No cva tenderness.  Musculoskeletal: He exhibits no edema.  Diffuse lumbar paraspinal tenderness to palpation. No midline back tenderness.  Neurological: He is alert and oriented to person, place, and time. No cranial nerve deficit.  Moves all extremities freely. Strength intact throughout.  Skin: Skin is warm and dry.  Psychiatric: He has a normal mood and affect.  Nursing note and vitals reviewed.   ED Course  Procedures (including critical care time) Labs Review Labs Reviewed  COMPREHENSIVE METABOLIC PANEL - Abnormal; Notable for the following:    Potassium 3.3 (*)    CO2 21 (*)    Glucose, Bld 223 (*)    Creatinine, Ser 2.41 (*)    Calcium 8.2 (*)    Total Protein 6.3 (*)    GFR calc non Af Amer 26 (*)    GFR calc Af Amer 31 (*)    All other components within normal limits  LIPASE, BLOOD - Abnormal; Notable for the following:    Lipase 89 (*)    All other components within normal limits  CBC WITH DIFFERENTIAL/PLATELET - Abnormal; Notable for the following:    RBC 3.81 (*)    Hemoglobin 12.1 (*)    HCT 33.2 (*)    MCHC 36.4 (*)    All other components within normal limits  URINALYSIS, ROUTINE W REFLEX MICROSCOPIC (NOT AT [facility]) - Abnormal; Notable for the following:    Glucose, UA 500 (*)    Hgb urine dipstick SMALL (*)    Protein, ur >300 (*)    All other components within normal limits  URINE MICROSCOPIC-ADD ON - Abnormal; Notable for the following:    Squamous Epithelial / LPF 0-5 (*)    Bacteria, UA RARE (*)    Casts GRANULAR CAST (*)    All other components within normal limits    Imaging Review No results found. I have personally reviewed and evaluated these images and lab results as part of my medical decision-making.   EKG Interpretation None      MDM   Final diagnoses:  Alcohol withdrawal, with perceptual disturbance  (HCC)  Lower abdominal pain  Non-intractable vomiting with nausea, vomiting of unspecified type    On reassessment pt's wife is now in the room and does clarify timeline of benzo/opioid use and discontinuation as well as recent increase in alcohol intake. Apparently pt has been binge drinking over the past month to compensate for discontinuation of xanax/oxycodone. In the ED now he does endorse some visual hallucinations (seeing cobwebs) and tactile paresthesisas. I spoke with the family medicine service to admit pt for alcohol withdrawal.  Anne Ng, PA-C 10/11/15 1232  Isla Pence, MD 10/11/15 1551   Isla Pence, MD 11/08/15 [phone]

## 2015-10-11 NOTE — ED Notes (Signed)
PA at bedside.

## 2015-10-11 NOTE — H&P (Signed)
[facility] Admission History and Physical Service Pager: [phone]  Patient name: Joseph Mcdonald Medical record number: [id number] Date of birth: 13-Jan-1951 Age: 65 y.o. Gender: male  Primary Care Provider: No primary care provider on file. Consultants: None Code Status: Full  Chief Complaint: Abdominal pain, nausea and vomiting  Assessment and Plan: Joseph Mcdonald is a 65 y.o. male with a past medical history significant for DM type II, hypertension, hyperlipidemia, CABG (2011) and chronic back pain who presented with abdominal pain, nausea and vomiting most likely secondary to alcohol withdrawal.   #Alcohol withdrawal, ongoing Patient with a previous history of alcohol abuse 15 years ago when he went to rehab and had severe withdrawal symptoms. Patient was on Xanax and oxycodone for chronic back pain for the past 4 years. A month ago, patient was taken off pain meds which triggered self medication and renew alcohol consumption. For the past 3 weeks patient has been drinking a significant amount of alcohol. Patient has been feeling nauseated, vomiting with some abdominal pain and dry heaves. Patient decided to come to the ED with worsening symptoms. Patient last drink was 4 days ago, patient already experiencing some shakiness and confusion when examined in the ED. Given severe withdrawal symptoms in the past, patient will be closely monitor for delirium tremens and seizures.   --Admit patient to stepdown, attending Dorcas Mcmurray, MD --Telemetry --Start CIWA protocol --Start Ativan 2mg  po bid --NS 0.9% 125 cc/hr --Give thiamine 100 mg and Folate 1 mg --Check UDS --Check serum ethanol --am BMP, serum creatinine  #HTN On admission, patient's blood pressure was 198/106,will continue patient home regimen. Patient denies any chest pain, shortness of breath. Elevated BP likely secondary to acute alcohol withdrawal, anticipate improvement as  withdrawal symptoms subside.  --Continue Amlodipine 5mg  po once daily --Continue Aspirin 81 mg po once daily --Would benefit from ACE/ARB given concurrent T2DM and CKD with proteinuria  #DM type II, Peripheral Neuropathy. On metformin and glipizide at home. Has levemir 75U and novolog 30U tid with meals on med list.  Also on gabapentin 800mg  bid at home.  Patient last A1c was 9.6 (2011) --SSI --Reduced home basal dose to 30U levemir --Reduce gabapentin to 700mg  daily given renal function --STOP metformin given history of CKD, would not resume on discharge --Hold glipizide. Would consider discontinuing at discharge given increased risk for hypoglycemia with concurrent insulin use --Consider starting GLP-1 agonist as outpatient  #Hyperlipidemia --Continue Atorvastatin 80 mg --Continue Ezetimibe 10 mg  #Elevated creatinine on CKD III (unclear acuity).  Patient's creatinine on admission was 2.41, 2.2 (2014) baseline appears to be around 1.6. Gross proteinuria noted on UA --Will further investigate, and try to get outside records if needed. --Hold home HCTZ --IVF overnight, recheck AM BMP --Avoid nephrotoxic medications  FEN/GI: Heart healthy, carb modified Prophylaxis: Lovenox  Disposition: Admit to stepdown for alcohol withdrawal  History of Present Illness:  Joseph Mcdonald is a 65 y.o. male with a past medical history significant DM type II, hypertension, hyperlipidemia, and chronic back pain who present today with abdominal pain, nausea and vomiting. Patient reports that symptoms started 3 days ago, he has not been able to eat or drink much because of nausea, dry heaving and some vomiting. Patient admit that for the past month he has been drinking a lot "1/2 gallon of liquor, a fifth of vodka, a pint of vodka and two six packs of beer". Patient is former alcoholic who has been sober for the past  15 years. Last month, patient was weaned off completely from his oxycodone  prescription for his chronic back pain. Patient has been on the oxycodone for the past year. Prior to that patient was on Xanax for his back pain as well. Patient admit to "self medication" with alcohol for the past month. Because of worsening symptoms and inability to eat and drink, patient decided to stop his alcohol intake. Patient had  His last drink on this past Saturday (7/15). Since then patient have displayed sign of withdrawal, he endorses some nausea, dry heaving, diaphoresis, chills, tremors, and mild tactile hallucinations. Patient also endorse some diarrhea but denies any chest pain, headaches, visual or hearing changes. Of note patient had a history of withdrawal when he first detox 15 years ago, with diaphoresis, tremors, visual and tactiles hallucinations but denies seizures. In the ED, patient receives a GI cocktail with, zofran and reglan as well as some ativan and morphine for pain.  Review Of Systems: Per HPI otherwise the remainder of the systems were negative.  Patient Active Problem List   Diagnosis Date Noted  . Alcohol withdrawal ([place]) 10/11/2015  . HYPERLIPIDEMIA-MIXED 01/18/2010  . HYPERTENSION, BENIGN 01/18/2010  . PERIPHERAL NEUROPATHY 01/17/2010  . CAD 01/17/2010    Past Medical History: Past Medical History  Diagnosis Date  . Diabetes mellitus without complication ([place])   . Hypertension   . Peripheral neuropathy ([place])   . Anxiety   . Depression     Past Surgical History: Past Surgical History  Procedure Laterality Date  . Coronary artery bypass graft      Social History: Social History  Substance Use Topics  . Smoking status: Never Smoker   . Smokeless tobacco: None  . Alcohol Use: No   Please also refer to relevant sections of EMR.  Family History: Family History  Problem Relation Age of Onset  . Heart attack Father   . Heart attack Brother   . Heart attack Brother    Allergies and Medications: Allergies  Allergen Reactions  .  Penicillins Hives and Itching   No current facility-administered medications on file prior to encounter.   Current Outpatient Prescriptions on File Prior to Encounter  Medication Sig Dispense Refill  . aspirin 81 MG tablet Take 1 tablet (81 mg total) by mouth daily. 30 tablet   . atorvastatin (LIPITOR) 40 MG tablet Take 80 mg by mouth daily.     Marland Kitchen gabapentin (NEURONTIN) 800 MG tablet Take 800 mg by mouth 2 (two) times daily.     Marland Kitchen glipiZIDE (GLUCOTROL) 10 MG tablet Take 10 mg by mouth 2 (two) times daily before a meal.    . hydrochlorothiazide (HYDRODIURIL) 25 MG tablet Take 25 mg by mouth daily.    . INSULIN GLULISINE IJ Inject as directed. Stated he is taking 2 different insulins but does not recall the names.    . OxyCODONE HCl (ROXICODONE PO) Take by mouth as directed.      Objective: BP 146/47 mmHg  Pulse 68  Temp(Src) 98.4 F (36.9 C) (Oral)  Resp 21  Ht 5\' 4"  (1.626 m)  Wt 170 lb (77.111 kg)  BMI 29.17 kg/m2  SpO2 99% Exam: General: 65yo gentleman, tremulous, slow speech and confused at time, but able to participate in exam HEENT: Normocephalic, atraumatic. EOMI, PERRLA.  Neck: Supple, no JVD. No bruits.  Lungs: clear to auscultation. Normal work of breathing. No rales no wheezes or rhonchi  Heart: Regular rate and rhythm. Normal S1, S2. No murmurs rubs or  gallops.  Abdomen: non tender, non distended, normal bowel sounds. No masses. No hepatomegaly.  Extremities:Good distal pulses throughout. No lower extremity edema.  Musculoskeletal : moving all extremities, Full range of motion, tremors noted on exam. No asterixis.  Neuro: Alert and Oriented x4. CN II-XII grossly intact, strength +5 bilateral upper and lower extremities Psych: No apparent audiovisual hallucinations. Normal thought content.   Labs and Imaging: CBC BMET   Recent Labs Lab 10/11/15 0934  WBC 7.5  HGB 12.1*  HCT 33.2*  PLT 252    Recent Labs Lab 10/11/15 0934  NA 136  K 3.3*  CL  106  CO2 21*  BUN 18  CREATININE 2.41*  GLUCOSE 223*  CALCIUM 8.2*     Abdoulaye Diallo, MD 10/11/2015, 4:58 PM PGY-1, [place] Intern pager: [phone], text pages welcome  UPPER LEVEL ADDENDUM  I have read the above note and made revisions highlighted in blue.  Algis Greenhouse. Jerline Pain, [place] [facility] Resident PGY-3 10/11/2015 8:27 PM

## 2015-10-12 LAB — BASIC METABOLIC PANEL
ANION GAP: 4 — AB (ref 5–15)
BUN: 22 mg/dL — AB (ref 6–20)
CHLORIDE: 114 mmol/L — AB (ref 101–111)
CO2: 21 mmol/L — ABNORMAL LOW (ref 22–32)
Calcium: 7.5 mg/dL — ABNORMAL LOW (ref 8.9–10.3)
Creatinine, Ser: 2.41 mg/dL — ABNORMAL HIGH (ref 0.61–1.24)
GFR calc Af Amer: 31 mL/min — ABNORMAL LOW (ref >60)
GFR, EST NON AFRICAN AMERICAN: 26 mL/min — AB (ref >60)
GLUCOSE: 59 mg/dL — AB (ref 65–99)
POTASSIUM: 3.3 mmol/L — AB (ref 3.5–5.1)
Sodium: 139 mmol/L (ref 135–145)

## 2015-10-12 LAB — CBC
HEMATOCRIT: 28.6 % — AB (ref 39.0–52.0)
HEMOGLOBIN: 9.7 g/dL — AB (ref 13.0–17.0)
MCH: 30.1 pg (ref 26.0–34.0)
MCHC: 33.9 g/dL (ref 30.0–36.0)
MCV: 88.8 fL (ref 78.0–100.0)
Platelets: 209 10*3/uL (ref 150–400)
RBC: 3.22 MIL/uL — ABNORMAL LOW (ref 4.22–5.81)
RDW: 13.3 % (ref 11.5–15.5)
WBC: 5.9 10*3/uL (ref 4.0–10.5)

## 2015-10-12 LAB — GLUCOSE, CAPILLARY
GLUCOSE-CAPILLARY: 66 mg/dL (ref 65–99)
GLUCOSE-CAPILLARY: 94 mg/dL (ref 65–99)
GLUCOSE-CAPILLARY: 113 mg/dL — AB (ref 65–99)
Glucose-Capillary: 133 mg/dL — ABNORMAL HIGH (ref 65–99)
Glucose-Capillary: 207 mg/dL — ABNORMAL HIGH (ref 65–99)

## 2015-10-12 LAB — HEMOGLOBIN A1C
Hgb A1c MFr Bld: 8.3 % — ABNORMAL HIGH (ref 4.8–5.6)
MEAN PLASMA GLUCOSE: 192 mg/dL

## 2015-10-12 LAB — MRSA PCR SCREENING: MRSA by PCR: NEGATIVE

## 2015-10-12 NOTE — Progress Notes (Signed)
Family Medicine Teaching Service Daily Progress Note Intern Pager: [phone]  Patient name: Joseph Mcdonald Medical record number: [id number] Date of birth: 1950-04-04 Age: 65 y.o. Gender: male  Primary Care Provider: No primary care provider on file. Consultants: None  Code Status: Full  Pt Overview and Major Events to Date:  Place on CIWA protocol  Assessment and Plan: Joseph Mcdonald is a 65 y.o. male with a past medical history significant for DM type II, hypertension, hyperlipidemia, CABG (2011) and chronic back pain who presented with abdominal pain, nausea and vomiting most likely secondary to alcohol withdrawal.   #Alcohol withdrawal, ongoing Patient with extensive history of alcohol abuse, was sober for 15 years and recently started drinking again.Patient with n/v abdominal pain, elevated BP and tremors with CIWA score of 12 on admission. Overnight patient did well, with CIWA score of 2,2,3. Will continue to monitor, patient could still be in the window for DT and seizures, though more than 96 hours since last drink.  --Telemetry --Continue CIWA protocol --Continue Ativan 2mg  po bid --NS 0.9% 125 cc/hr --Give thiamine 100 mg and Folate 1 mg --am BMP, serum creatinine  #HTN On admission, patient's blood pressure was 198/106,will continue patient home regimen. Patient BP continue to be elevated most likely secondary to acute alcohol withdrawal, anticipate improvement as withdrawal symptoms subside.  --Continue Amlodipine 5mg  po once daily --Continue Aspirin 81 mg po once daily --Would benefit from ACE/ARB given concurrent T2DM and CKD with proteinuria  #DM type II, Peripheral Neuropathy. On metformin and glipizide at home. Has levemir 75U and novolog 30U tid with meals on med list. Also on gabapentin 800mg  bid at home.  Patient last A1c was 9.6 (2011) --SSI --Reduced home basal dose to 30U levemir --Reduce gabapentin to 700mg  daily given renal  function --STOP metformin given history of CKD, would not resume on discharge --Hold glipizide. Would consider discontinuing at discharge given increased risk for hypoglycemia with concurrent insulin use --Consider starting GLP-1 agonist as outpatient  #Hyperlipidemia --Continue Atorvastatin 80 mg --Continue Ezetimibe 10 mg  #Elevated creatinine on CKD III (unclear acuity).  Patient's creatinine on admission was 2.41, 2.2 (2014) baseline appears to be around 1.6. Gross proteinuria noted on UA --Will further investigate, and try to get outside records if needed. --Hold home HCTZ --IVF overnight, recheck AM BMP --Avoid nephrotoxic medications  FEN/GI: Heart Healthy, Carb modified PPx: Lovenox  Disposition: Step down  Subjective:  Patient feeling well this morning, tremors have improved overnight. Patient endorses some chills but denies any fever, diaphoresis, visual and tactile hallucinations and abdominal pain. Wife was at bedside this morning.Patient is eating and drinking without any nausea or vomiting.  Objective: Temp:  [98.1 F (36.7 C)-98.7 F (37.1 C)] 98.3 F (36.8 C) (07/20 0325) Pulse Rate:  [63-87] 70 (07/20 0325) Resp:  [12-23] 23 (07/20 0325) BP: (130-198)/(47-106) 167/77 mmHg (07/20 0325) SpO2:  [97 %-99 %] 99 % (07/20 0325) Weight:  [170 lb (77.111 kg)-171 lb 4.8 oz (77.7 kg)] 171 lb 4.8 oz (77.7 kg) (07/19 2030) Physical Exam: General: 65yo gentleman, tremulous, slow speech and confused at time, but able to participate in exam HEENT: Normocephalic, atraumatic. EOMI, PERRLA.  Neck: Supple, no JVD. No bruits.  Lungs: clear to auscultation. Normal work of breathing. No rales no wheezes or rhonchi  Heart: Regular rate and rhythm. Normal S1, S2. No murmurs rubs or gallops.  Abdomen: non tender, non distended, normal bowel sounds. No masses. No hepatomegaly.  Extremities:Good distal pulses throughout. No lower  extremity edema.  Musculoskeletal :  moving all extremities, Full range of motion, tremors noted on exam. No asterixis.  Neuro: Alert and Oriented x4. CN II-XII grossly intact, strength +5 bilateral upper and lower extremities Psych: No apparent audiovisual hallucinations. Normal thought content.    Laboratory:  Recent Labs Lab 10/11/15 0934 10/12/15 0436  WBC 7.5 5.9  HGB 12.1* 9.7*  HCT 33.2* 28.6*  PLT 252 209    Recent Labs Lab 10/11/15 0934 10/12/15 0436  NA 136 139  K 3.3* 3.3*  CL 106 114*  CO2 21* 21*  BUN 18 22*  CREATININE 2.41* 2.41*  CALCIUM 8.2* 7.5*  PROT 6.3*  --   BILITOT 0.7  --   ALKPHOS 62  --   ALT 20  --   AST 22  --   GLUCOSE 223* 59*    Imaging/Diagnostic Tests: No results found.   Marjie Skiff, MD 10/12/2015, 7:09 AM PGY-1, [place] Intern pager: [phone], text pages welcome

## 2015-10-12 NOTE — Care Management Note (Signed)
Case Management Note  Patient Details  Name: Joseph Mcdonald MRN: [id number] Date of Birth: 09/13/1950   Subjective/Objective:  Patient is from home, presents withwith abdominal pain, nausea and vomiting most likely secondary to alcohol withdrawal.  NCM will cont to follow for dc needs.                  Action/Plan:   Expected Discharge Date:                  Expected Discharge Plan:  Home/Self Care  In-House Referral:     Discharge planning Services  CM Consult  Post Acute Care Choice:    Choice offered to:     DME Arranged:    DME Agency:     HH Arranged:    HH Agency:     Status of Service:  In process, will continue to follow  If discussed at Long Length of Stay Meetings, dates discussed:    Additional Comments:  Zenon Mayo, RN 10/12/2015, 6:15 PM

## 2015-10-13 LAB — GLUCOSE, CAPILLARY
GLUCOSE-CAPILLARY: 290 mg/dL — AB (ref 65–99)
Glucose-Capillary: 151 mg/dL — ABNORMAL HIGH (ref 65–99)

## 2015-10-13 MED ORDER — DIAZEPAM 2 MG PO TABS: 2.0000 mg | ORAL_TABLET | Freq: Two times a day (BID) | ORAL | Status: DC | PRN

## 2015-10-13 MED ORDER — GABAPENTIN 600 MG PO TABS: 600.0000 mg | ORAL_TABLET | Freq: Two times a day (BID) | ORAL | Status: DC

## 2015-10-13 NOTE — Discharge Instructions (Addendum)
We will discharge you on Valium take 1 tab twice daily for 14 days. Please make sure you do not take this with alcohol, consequence are catastrophic, you could stop breathing if you mix the valium and alcohol. Please take advantage of the resource given to you by Education officer, museum. We stop your oral diabetic meds since you are already on insulin due to risk of hypoglycemia (blood sugar is very low). We also change your gabapentin to 600 mg two times a day instead of 800 mg three times a day because of your kidney function. We made an appointment for you: [facility] [place], [place], Friday, October 20 2015 at 2:15

## 2015-10-13 NOTE — Care Management Important Message (Signed)
Important Message  Patient Details  Name: Joseph Mcdonald MRN: [id number] Date of Birth: 07-26-1950   Medicare Important Message Given:  Yes    Loann Quill 10/13/2015, 10:21 AM

## 2015-10-13 NOTE — Progress Notes (Signed)
Inpatient Diabetes Program Recommendations  AACE/ADA: New Consensus Statement on Inpatient Glycemic Control (2015)  Target Ranges:  Prepandial:   less than 140 mg/dL      Peak postprandial:   less than 180 mg/dL (1-2 hours)      Critically ill patients:  140 - 180 mg/dL   Results for RIDGE, DUMOND (MRN [id number]) as of 10/13/2015 09:53  Ref. Range 10/12/2015 08:37 10/12/2015 09:00 10/12/2015 12:21 10/12/2015 17:23 10/12/2015 21:42  Glucose-Capillary Latest Ref Range: 65-99 mg/dL 66 94 133 (H) 207 (H) 113 (H)   Results for JAYVAN, ROPER (MRN [id number]) as of 10/13/2015 09:53  Ref. Range 10/13/2015 07:38  Glucose-Capillary Latest Ref Range: 65-99 mg/dL 290 (H)    Home DM Meds: Levemir 75 units QHS       Novolog        Glipizide 10 mg bid       Metformin 500 mg bid  Current Insulin Orders: Levemir 30 units QHS      Novolog Moderate Correction Scale/ SSI (0-15 units) TID AC + HS      MD- Please note that patient refused to take Levemir last PM.  Per nursing notes from [facility], patient stated he was "jittery" and didn't want to take the Levemir.  As a result, CBG this AM is 290 mg/dl.      --Will follow patient during hospitalization--  Wyn Quaker RN, MSN, CDE Diabetes Coordinator Inpatient Glycemic Control Team Team Pager: [phone] (8a-5p)

## 2015-10-13 NOTE — Progress Notes (Signed)
Pt discharged per MD order, instructions reviewed and all questions answered

## 2015-10-13 NOTE — Clinical Social Work Note (Signed)
Clinical Social Work Assessment  Patient Details  Name: Joseph Mcdonald MRN: [id number] Date of Birth: [birth year]  Date of referral:  10/13/15               Reason for consult:                   Permission sought to share information with:  Family Supports Permission granted to share information::  Yes, Verbal Permission Granted  Name::     Diplomatic Services operational officer::     Relationship::     Contact Information:     Housing/Transportation Living arrangements for the past 2 months:  Apartment Source of Information:  Patient Patient Interpreter Needed:  None Criminal Activity/Legal Involvement Pertinent to Current Situation/Hospitalization:  No - Comment as needed Significant Relationships:  Spouse Lives with:  Spouse Do you feel safe going back to the place where you live?  Yes Need for family participation in patient care:  No (Coment)  Care giving concerns:  The patient does not report any care giving concerns at this time. He plans to return home with his wife.   Social Worker assessment / plan:  CSW met with patient at bedside to complete assessment and provide resources for his alcohol use. The patient shares that he is from home with his wife where he plans to go when he is discharged from the hospital. The patient states that he has a substance abuse history significant for alcohol use. He reports that he remained sober for the past 15 years with the exception of the week prior to admission. He states that he has drank some beer (6-12 beers) per day for the week PTA. The patient states that he started drinking within the past week to deal with his pain. Apparently his pain medication and xanax was stopped by another doctor and he drank to address the pain. The patient states he does not believe he has a drinking problem and will not find it difficult to stop drinking as long as his pain is adequately addressed. CSW completed SBIRT with patient and provided the patient with outpatient  and residential treatment options. CSW will sign off at this time as the patient does not present with any other CSW related needs and is going home today.  Employment status:  Retired, Disabled (Comment on whether or not currently receiving Disability) Insurance information:  Medicare, Medicaid [place] PT Recommendations:  Not assessed at this time Information / Referral to community resources:  SBIRT, Outpatient Substance Abuse Treatment Options, Residential Substance Abuse Treatment Options  Patient/Family's Response to care: The patient states that he has been satisfied with the care he has received and appreciates the information provided by CSW. He states he just wants to go home.   Patient/Family's Understanding of and Emotional Response to Diagnosis, Current Treatment, and Prognosis:  The patient has a good understanding of the reason for his admission and his post DC needs. He appears confident in his ability to refrain from drinking alcohol.   Emotional Assessment Appearance:  Appears stated age Attitude/Demeanor/Rapport:  Other (Patient is appropriate and welcoming of CSW.) Affect (typically observed):  Accepting, Appropriate, Calm, Pleasant Orientation:  Oriented to Self, Oriented to Place, Oriented to  Time, Oriented to Situation Alcohol / Substance use:  Alcohol Use Psych involvement (Current and /or in the community):  No (Comment)  Discharge Needs  Concerns to be addressed:  Substance Abuse Concerns Readmission within the last 30 days:  No Current discharge risk:  Substance Abuse Barriers to Discharge:  No Barriers Identified   Sherlynn Carbon 10/13/2015, 2:49 PM

## 2015-10-16 NOTE — Discharge Summary (Signed)
[facility] Discharge Summary  Patient name: Joseph Mcdonald Medical record number: [id number] Date of birth: 1950/12/03 Age: 65 y.o. Gender: male Date of Admission: 10/11/2015  Date of Discharge: 10/13/2015 Admitting Physician: Dickie La, MD  Primary Care Provider: No primary care provider on file. Consultants: 10/13/15  Indication for Hospitalization: Alcohol withdrawal   Discharge Diagnoses/Problem List:  Alcohol withdrawal  Disposition: Home   Discharge Condition: Stable  Discharge Exam:  General: 65yo gentleman, pleasant, in no acute distress and able to participate in exam HEENT: Normocephalic, atraumatic. EOMI, PERRLA.  Neck: Supple, no JVD. No bruits.  Lungs: clear to auscultation. Normal work of breathing. No rales no wheezes or rhonchi  Heart: Regular rate and rhythm. Normal S1, S2. No murmurs rubs or gallops.  Abdomen: non tender, non distended, normal bowel sounds. No masses. No hepatomegaly.  Extremities:Good distal pulses throughout. No lower extremity edema.  Musculoskeletal : moving all extremities, Full range of motion, no tremors. No asterixis.  Neuro: Alert and Oriented x4. CN II-XII grossly intact, strength +5 bilateral upper and lower extremities Psych: No apparent audiovisual hallucinations. Normal thought content.   Brief Hospital Course:  Joseph Mcdonald is a 65 y.o. male with a past medical history significant for DM type II, hypertension, hyperlipidemia, CABG (2011) and chronic back pain who presented with abdominal pain, nausea and vomiting most likely secondary to alcohol withdrawal  #Alcohol withdrawal, ongoing Patient with extensive history of alcohol abuse, was sober for 15 years and recently started drinking again.Patient with n/v abdominal pain, elevated BP and tremors with CIWA score of 12 on admission. Patient was admitted for symptoms management. While inpatient, patient was place on  CIWA with score ranging from 0 to 5. Patient had mild tremors but no other signs of acute withdrawal symptoms such as visual and tactile hallucinations, diaphoresis, chills. In addition to CIWA protocol patient received 2mg  po bid of ativan as needed for additional symptoms control. Patient also received thiamine and folate. Patient was discharge 48 hrs after admission and was stable.   #HTN On admission, patient's blood pressure was 198/106,will continue patient home regimen. Patient BP continue to be elevated most likely secondary to acute alcohol withdrawal, BP improved as withdrawal symptoms subsided.  --Continue Amlodipine 5mg  po once daily --Continue Aspirin 81 mg po once daily --Would benefit from ACE/ARB given concurrent T2DM and CKD with proteinuria, follow up with PCP  #DM type II, Peripheral Neuropathy. On metformin and glipizide at home. Has levemir 75U and novolog 30U tid with meals on med list. Also on gabapentin 800mg  bid at home.  Patient last A1c was 9.6 (2011). Patient was taken off metformin with his history of CKD and asked not to resume at discharge. Patient currently on insulin, will stop glipizide as it will increase risk of hypoglycemia. Gabapentin was also reduced to 700mg  daily given with decrease renal function.  --Consider starting GLP-1 agonist as outpatient  #Hyperlipidemia --Continue Atorvastatin 80 mg --Continue Ezetimibe 10 mg  #Elevated creatinine on CKD III (unclear acuity).  Patient's creatinine on admission was 2.41, 2.2 (2014) baseline appears to be around 1.6. Gross proteinuria noted on UA. Patient HCTZ was held in the setting of elevated creatinine, with acute on chronic kidney injury.   Issues for Follow Up:  1. Establish care with PCP for chronic medical problems management  Significant Procedures: None  Significant Labs and Imaging:   Recent Labs Lab 10/11/15 0934 10/12/15 0436  WBC 7.5 5.9  HGB 12.1* 9.7*  HCT 33.2* 28.6*  PLT 252  209    Recent Labs Lab 10/11/15 0934 10/12/15 0436  NA 136 139  K 3.3* 3.3*  CL 106 114*  CO2 21* 21*  GLUCOSE 223* 59*  BUN 18 22*  CREATININE 2.41* 2.41*  CALCIUM 8.2* 7.5*  ALKPHOS 62  --   AST 22  --   ALT 20  --   ALBUMIN 3.6  --    No imaging during hospitalization   Results/Tests Pending at Time of Discharge: None   Discharge Medications:    Medication List    STOP taking these medications   glimepiride 4 MG tablet Commonly known as:  AMARYL   glipiZIDE 10 MG tablet Commonly known as:  GLUCOTROL   metFORMIN 500 MG tablet Commonly known as:  GLUCOPHAGE   simvastatin 80 MG tablet Commonly known as:  ZOCOR     TAKE these medications   allopurinol 100 MG tablet Commonly known as:  ZYLOPRIM Take 100 mg by mouth daily.   amLODipine 5 MG tablet Commonly known as:  NORVASC Take 5 mg by mouth 2 (two) times daily.   aspirin 81 MG tablet Take 1 tablet (81 mg total) by mouth daily.   atorvastatin 40 MG tablet Commonly known as:  LIPITOR Take 80 mg by mouth daily.   busPIRone 15 MG tablet Commonly known as:  BUSPAR Take 15 mg by mouth 3 (three) times daily.   diazepam 2 MG tablet Commonly known as:  VALIUM Take 1 tablet (2 mg total) by mouth every 12 (twelve) hours as needed for anxiety.   ezetimibe 10 MG tablet Commonly known as:  ZETIA Take 10 mg by mouth daily.   gabapentin 600 MG tablet Commonly known as:  NEURONTIN Take 1 tablet (600 mg total) by mouth 2 (two) times daily. What changed:  medication strength  how much to take  when to take this   hydrALAZINE 50 MG tablet Commonly known as:  APRESOLINE Take 50 mg by mouth 3 (three) times daily.   hydrochlorothiazide 25 MG tablet Commonly known as:  HYDRODIURIL Take 25 mg by mouth daily.   LEVEMIR FLEXTOUCH 100 UNIT/ML Pen Generic drug:  Insulin Detemir Inject 75 Units into the skin at bedtime.   NOVOLOG FLEXPEN 100 UNIT/ML FlexPen Generic drug:  insulin aspart Inject 30  Units into the skin See admin instructions. 3 times daily with meals per sliding scale   sertraline 50 MG tablet Commonly known as:  ZOLOFT Take 50 mg by mouth daily.       Discharge Instructions: Please refer to Patient Instructions section of EMR for full details.  Patient was counseled important signs and symptoms that should prompt return to medical care, changes in medications, dietary instructions, activity restrictions, and follow up appointments.   Follow-Up Appointments: Follow-up Information    Mercy Riding, MD. Go on 10/20/2015.   Specialty:  Family Medicine Why:  @ 2:15pm Contact information: [place] [address] [phone]           Marjie Skiff, MD 10/16/2015, 2:05 PM PGY-1, [facility]

## 2015-10-20 ENCOUNTER — Encounter: Payer: Self-pay | Admitting: Student

## 2015-10-20 ENCOUNTER — Ambulatory Visit (INDEPENDENT_AMBULATORY_CARE_PROVIDER_SITE_OTHER): Payer: Medicare Other | Admitting: Student

## 2015-10-20 VITALS — BP 154/70 | HR 68 | Temp 98.3°F | Ht 64.0 in | Wt 173.4 lb

## 2015-10-20 DIAGNOSIS — N183 Chronic kidney disease, stage 3 (moderate): Secondary | ICD-10-CM

## 2015-10-20 DIAGNOSIS — I10 Essential (primary) hypertension: Secondary | ICD-10-CM

## 2015-10-20 DIAGNOSIS — E1122 Type 2 diabetes mellitus with diabetic chronic kidney disease: Secondary | ICD-10-CM

## 2015-10-20 DIAGNOSIS — R739 Hyperglycemia, unspecified: Secondary | ICD-10-CM

## 2015-10-20 DIAGNOSIS — F32A Depression, unspecified: Secondary | ICD-10-CM

## 2015-10-20 DIAGNOSIS — F329 Major depressive disorder, single episode, unspecified: Secondary | ICD-10-CM | POA: Diagnosis not present

## 2015-10-20 DIAGNOSIS — R7309 Other abnormal glucose: Secondary | ICD-10-CM | POA: Diagnosis not present

## 2015-10-20 DIAGNOSIS — F411 Generalized anxiety disorder: Secondary | ICD-10-CM

## 2015-10-20 DIAGNOSIS — F10232 Alcohol dependence with withdrawal with perceptual disturbance: Secondary | ICD-10-CM | POA: Diagnosis present

## 2015-10-20 LAB — BASIC METABOLIC PANEL WITH GFR
BUN: 35 mg/dL — AB (ref 7–25)
CHLORIDE: 103 mmol/L (ref 98–110)
CO2: 19 mmol/L — AB (ref 20–31)
CREATININE: 2.77 mg/dL — AB (ref 0.70–1.25)
Calcium: 8.5 mg/dL — ABNORMAL LOW (ref 8.6–10.3)
GFR, Est African American: 26 mL/min — ABNORMAL LOW (ref >=60)
GFR, Est Non African American: 23 mL/min — ABNORMAL LOW (ref >=60)
GLUCOSE: 171 mg/dL — AB (ref 65–99)
POTASSIUM: 3.8 mmol/L (ref 3.5–5.3)
Sodium: 136 mmol/L (ref 135–146)

## 2015-10-20 MED ORDER — ESCITALOPRAM OXALATE 10 MG PO TABS: 10.0000 mg | ORAL_TABLET | Freq: Every day | ORAL | 0 refills | Status: DC

## 2015-10-20 MED ORDER — ALPRAZOLAM 0.5 MG PO TABS: 1.0000 mg | ORAL_TABLET | Freq: Two times a day (BID) | ORAL | 0 refills | Status: DC | PRN

## 2015-10-20 NOTE — Assessment & Plan Note (Signed)
PHQ9 25 with extreme difficulty. Denies suicidal or homicidal ideation upon. Has been on Zoloft 50 mg daily for long time. Given severe anxiety issue, have change in his Zoloft to Lexapro 10 mg daily.  Patient to return in 2 weeks for follow-up on this. I may consider referral to psychiatry if no improvement. I have collected his Zoloft and buspirone bottles and handed to the nurse for disposal to avoid confusion.

## 2015-10-20 NOTE — Assessment & Plan Note (Signed)
A1c 8.3  10 days ago. Will continue insulin. I have taken his metformin and glimepiride bottles from him and handed to the nurse for disposal to minimize confusion.  -We'll obtain another one A1c in 3 months.

## 2015-10-20 NOTE — Assessment & Plan Note (Signed)
Blood pressure 154/70. Not clear if he is taking his hydrochlorothiazide. He did not bring his bottle for this particular medication today. Wife says they could have forgotten it at home. Patient to bring his medication bottles to the clinic when he comes back for follow-up on depression and anxiety. Ordered BMP today.

## 2015-10-20 NOTE — Patient Instructions (Addendum)
It was great seeing you today! We have addressed the following issues today  1. Blood pressure: Your blood pressure is 154/70.  Your goal blood pressure is less than 150/90. We will check your kidney function today.  2. Diabetes: Continue using the insulin. I will check your  A1c today 3. Depression: I have stopped his Zoloft. I have started a new medication called Lexapro. I have sent this prescription to your pharmacy. I would like you to start taking this medication tomorrow. I will likely to come back and see [place] in 2 weeks to follow-up on this 4. Anxiety: I have given you a prescription for Xanax. Please take this medication only for anxiety attack. I have also started her on Lexapro which could help with the anxiety. I have discontinued your Buspirone today   If we did any lab work today, and the results require attention, either me or my nurse will get in touch with you. If everything is normal, you will get a letter in mail. If you don't hear from [place] in two weeks, please give [place] a call. Otherwise, I look forward to talking with you again at our next visit. If you have any questions or concerns before then, please call the clinic at [phone].  Please bring all your medications to every doctors visit.    Sign up for My Chart to have easy access to your labs results, and communication with your Primary care physician.    Please check-out at the front desk before leaving the clinic.   Take Care,

## 2015-10-20 NOTE — Assessment & Plan Note (Signed)
GAD 7 score 21 with extreme difficulty. Has been on buspirone and Zoloft for a long time. He says this medications have not helped. He was discharged on Valium from hospital. He also had withdrawal from alcohol during that admission. I have started him on Lexapro 10 mg daily. I have also given him a prescription for Xanax 0.5 mg twice a day as needed for anxiety attack, quantity 30. Patient to return to the clinic in 2 weeks for further evaluation.
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
## 2015-10-23 ENCOUNTER — Encounter: Payer: Self-pay | Admitting: Student

## 2015-11-06 ENCOUNTER — Ambulatory Visit: Payer: Medicare Other | Admitting: Student

## 2015-11-07 ENCOUNTER — Other Ambulatory Visit: Payer: Self-pay | Admitting: Student

## 2015-11-07 DIAGNOSIS — F411 Generalized anxiety disorder: Secondary | ICD-10-CM

## 2015-11-07 NOTE — Telephone Encounter (Signed)
Pt missed an appointment yesterday, rescheduled for the 28th and would like to know if pt could get a refill on Xanax, enough to last until the next appointment. Please advise. Thanks! ep

## 2015-11-09 MED ORDER — ALPRAZOLAM 0.5 MG PO TABS: 0.5000 mg | ORAL_TABLET | Freq: Two times a day (BID) | ORAL | 0 refills | Status: DC | PRN

## 2015-11-20 ENCOUNTER — Encounter: Payer: Self-pay | Admitting: Student

## 2015-11-20 ENCOUNTER — Ambulatory Visit (INDEPENDENT_AMBULATORY_CARE_PROVIDER_SITE_OTHER): Payer: Medicare Other | Admitting: Student

## 2015-11-20 ENCOUNTER — Telehealth: Payer: Self-pay | Admitting: Student

## 2015-11-20 ENCOUNTER — Other Ambulatory Visit: Payer: Self-pay | Admitting: *Deleted

## 2015-11-20 VITALS — BP 152/61 | HR 50 | Temp 97.6°F | Ht 64.0 in | Wt 182.4 lb

## 2015-11-20 DIAGNOSIS — F32A Depression, unspecified: Secondary | ICD-10-CM

## 2015-11-20 DIAGNOSIS — F411 Generalized anxiety disorder: Secondary | ICD-10-CM | POA: Diagnosis not present

## 2015-11-20 DIAGNOSIS — M549 Dorsalgia, unspecified: Secondary | ICD-10-CM | POA: Diagnosis present

## 2015-11-20 DIAGNOSIS — F329 Major depressive disorder, single episode, unspecified: Secondary | ICD-10-CM

## 2015-11-20 DIAGNOSIS — Z23 Encounter for immunization: Secondary | ICD-10-CM | POA: Diagnosis not present

## 2015-11-20 DIAGNOSIS — I10 Essential (primary) hypertension: Secondary | ICD-10-CM

## 2015-11-20 DIAGNOSIS — G8929 Other chronic pain: Secondary | ICD-10-CM

## 2015-11-20 MED ORDER — CLONAZEPAM 0.5 MG PO TABS: 0.5000 mg | ORAL_TABLET | Freq: Two times a day (BID) | ORAL | 0 refills | Status: DC | PRN

## 2015-11-20 MED ORDER — HYDROCHLOROTHIAZIDE 25 MG PO TABS: 25.0000 mg | ORAL_TABLET | Freq: Every day | ORAL | 3 refills | Status: DC

## 2015-11-20 MED ORDER — OXYCODONE-ACETAMINOPHEN 5-325 MG PO TABS: 1.0000 | ORAL_TABLET | Freq: Three times a day (TID) | ORAL | 0 refills | Status: DC | PRN

## 2015-11-20 NOTE — Assessment & Plan Note (Addendum)
Noted patient's blood pressure is 152/61. Goal blood pressure is less than 140/60 given history of CKD. He said he has been out of his fluid pills for the last few days. Denies shortness of breath or chest pain. Cardiopulmonary exam is remarkable for trace edema bilaterally. Refilled his hydrochlorothiazide today

## 2015-11-20 NOTE — Assessment & Plan Note (Signed)
Pain likely due to OA and degenerative disc disease with some component of radiculopathy on the left. No acute changes or red flags at this time. Pain limits patient's quality of life and activity. He is also using BC powder and aspirin which could worsen his CKD. As the results I decided to give him a prescription for Percocet 5/325 upto every 8 hours as needed for pain, quantity 60. I also emphasized about weight loss and other conservative therapies. Warned about the sedative effect of this medication and constipation. Recommended using Colace. Patient to return in 1 month for follow-up on this. If he continues to need pain medication, we may have a pain contract signed and UDS done. We have discussed about discontinuing benzos at that visits as the combination of these 2 drugs can increase his risk of fall.

## 2015-11-20 NOTE — Patient Instructions (Addendum)
It was great seeing you today! We have addressed the following issues today  1. Anxiety: please pickup the prescription for Lexapro from the pharmacy and start taking it. I have also given you prescription for Clonazepam to be taken as needed for anxiety attack up to two times a day. Please take this medication only when needed.  2. Back pain: I have given you prescription for Percocet 5/325 mg. You can take this medication up to every 8 hours as needed for pain. Please be aware that this medication along with clonazepam can increase your risk of fall. Percocet can also make you constipated. I recommend getting Colace/stool softener over the counter.    Please come back to see [place] in a month for follow-up on your back pain and anxiety.    If we did any lab work today, and the results require attention, either me or my nurse will get in touch with you. If everything is normal, you will get a letter in mail. If you don't hear from [place] in two weeks, please give [place] a call. Otherwise, I look forward to talking with you again at our next visit. If you have any questions or concerns before then, please call the clinic at [phone].  Please bring all your medications to every doctors visit   Sign up for My Chart to have easy access to your labs results, and communication with your Primary care physician.    Please check-out at the front desk before leaving the clinic.   Take Care,
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
## 2015-11-20 NOTE — Assessment & Plan Note (Signed)
Continues to endorse nervousness, frustration, worries and shaking. He did not start taking his Lexapro  prescribed to him about a month ago. He states that pharmacy didn't have the medication and when he went to pick. Not sure if that min the medication is out of stock or on back order. I advised him to pick his Lexapro prescriptions today and start taking it. Gave him prescription for clonazepam 0.5 mg up to twice daily, quantity 20 as a way to wean him off benzo. Discontinued his Xanax today. Patient to follow-up on this issue in a month.

## 2015-11-20 NOTE — Telephone Encounter (Signed)
Patient asks PCP to complete DMV placard. Please, follow up.

## 2015-11-20 NOTE — Telephone Encounter (Signed)
Form placed in PCP box for completion. Zimmerman Rumple, Dexter Sauser D, CMA  

## 2015-11-21 MED ORDER — ESCITALOPRAM OXALATE 10 MG PO TABS: 10.0000 mg | ORAL_TABLET | Freq: Every day | ORAL | 0 refills | Status: DC

## 2015-11-23 NOTE — Telephone Encounter (Signed)
Called and talked to patient's about his form for DMV placard. Asked why he needed that while we are managing his pain with medication for him to be more active. He says he has history of bypass and back pain. However, he voiced understanding the benefit of walking. He says he is okay without disability parking for now. Patient appreciative about the call.

## 2015-12-11 ENCOUNTER — Other Ambulatory Visit: Payer: Self-pay | Admitting: *Deleted

## 2015-12-11 DIAGNOSIS — M549 Dorsalgia, unspecified: Principal | ICD-10-CM

## 2015-12-11 DIAGNOSIS — G8929 Other chronic pain: Secondary | ICD-10-CM

## 2015-12-11 NOTE — Telephone Encounter (Signed)
Wife called in and states that patient will be out of his pain medication today and will need a refill.  I informed her that the provider's have up to 48 hours to respond to refill request and that someone will contact him when the script is ready for pick up. Jazmin Hartsell,CMA

## 2015-12-14 ENCOUNTER — Other Ambulatory Visit: Payer: Self-pay | Admitting: *Deleted

## 2015-12-14 DIAGNOSIS — F329 Major depressive disorder, single episode, unspecified: Secondary | ICD-10-CM

## 2015-12-14 DIAGNOSIS — F411 Generalized anxiety disorder: Secondary | ICD-10-CM

## 2015-12-14 DIAGNOSIS — F32A Depression, unspecified: Secondary | ICD-10-CM

## 2015-12-14 MED ORDER — OXYCODONE-ACETAMINOPHEN 5-325 MG PO TABS: 1.0000 | ORAL_TABLET | Freq: Three times a day (TID) | ORAL | 0 refills | Status: DC | PRN

## 2015-12-15 ENCOUNTER — Telehealth: Payer: Self-pay | Admitting: *Deleted

## 2015-12-15 NOTE — Telephone Encounter (Signed)
Patient wife calling stating that pharmacy is requiring a prior authorization for patients oxycodone. Informed wife that once we received form from pharmacy we would get in in MD box.

## 2015-12-15 NOTE — Telephone Encounter (Signed)
Patietn wife calling again states pharmacy called her and informed her that she may need a new rx with a valid prescriber. Patient wife states this needs to be done today as patient is out of medication.

## 2015-12-15 NOTE — Telephone Encounter (Signed)
Pharmacy now calling, they state that two rejections are coming up for patients oxycodone: 1-Dr. Hinda Glatter DEA isnt being recognized by their system and 2-medication needs prior authorization. Pharmacy can be reached at [phone]

## 2015-12-15 NOTE — Telephone Encounter (Signed)
Prior authorization for oxycodone-acetaminophen 5-325 mg is pending per [place] [employer].  Derl Barrow, RN

## 2015-12-15 NOTE — Telephone Encounter (Signed)
Checked status of PA for pain medication, still pending per [place] [employer].  Derl Barrow, RN

## 2015-12-17 MED ORDER — ESCITALOPRAM OXALATE 10 MG PO TABS: 10.0000 mg | ORAL_TABLET | Freq: Every day | ORAL | 1 refills | Status: DC

## 2015-12-18 NOTE — Telephone Encounter (Signed)
PA for oxycodone-acetaminophen 5.-325 mg was denied via [place] [employer].  Per representative the claim was denied 12/16/15 due to patient having active Medicare Part D as of 07/23/17.  Confirmation [id number] W and call reference number: [id number].  Derl Barrow, RN

## 2015-12-19 ENCOUNTER — Encounter: Payer: Self-pay | Admitting: Student

## 2015-12-19 DIAGNOSIS — N2581 Secondary hyperparathyroidism of renal origin: Secondary | ICD-10-CM | POA: Insufficient documentation

## 2015-12-19 DIAGNOSIS — D638 Anemia in other chronic diseases classified elsewhere: Secondary | ICD-10-CM | POA: Insufficient documentation

## 2015-12-25 ENCOUNTER — Ambulatory Visit (INDEPENDENT_AMBULATORY_CARE_PROVIDER_SITE_OTHER): Payer: Medicare Other | Admitting: Student

## 2015-12-25 ENCOUNTER — Other Ambulatory Visit: Payer: Self-pay | Admitting: Student

## 2015-12-25 VITALS — BP 152/65 | HR 75 | Temp 98.0°F | Ht 64.0 in | Wt 171.6 lb

## 2015-12-25 DIAGNOSIS — M5440 Lumbago with sciatica, unspecified side: Secondary | ICD-10-CM

## 2015-12-25 DIAGNOSIS — F3289 Other specified depressive episodes: Secondary | ICD-10-CM

## 2015-12-25 DIAGNOSIS — G8929 Other chronic pain: Secondary | ICD-10-CM | POA: Diagnosis not present

## 2015-12-25 DIAGNOSIS — N189 Chronic kidney disease, unspecified: Secondary | ICD-10-CM

## 2015-12-25 DIAGNOSIS — I10 Essential (primary) hypertension: Secondary | ICD-10-CM

## 2015-12-25 DIAGNOSIS — F411 Generalized anxiety disorder: Secondary | ICD-10-CM

## 2015-12-25 DIAGNOSIS — F10239 Alcohol dependence with withdrawal, unspecified: Secondary | ICD-10-CM

## 2015-12-25 DIAGNOSIS — E1142 Type 2 diabetes mellitus with diabetic polyneuropathy: Secondary | ICD-10-CM | POA: Diagnosis not present

## 2015-12-25 DIAGNOSIS — F10939 Alcohol use, unspecified with withdrawal, unspecified: Secondary | ICD-10-CM

## 2015-12-25 DIAGNOSIS — Z9189 Other specified personal risk factors, not elsewhere classified: Secondary | ICD-10-CM | POA: Diagnosis not present

## 2015-12-25 DIAGNOSIS — E785 Hyperlipidemia, unspecified: Secondary | ICD-10-CM

## 2015-12-25 DIAGNOSIS — Z23 Encounter for immunization: Secondary | ICD-10-CM | POA: Diagnosis not present

## 2015-12-25 DIAGNOSIS — Z114 Encounter for screening for human immunodeficiency virus [HIV]: Secondary | ICD-10-CM

## 2015-12-25 DIAGNOSIS — Z1159 Encounter for screening for other viral diseases: Secondary | ICD-10-CM | POA: Diagnosis not present

## 2015-12-25 LAB — LIPID PANEL
CHOLESTEROL: 180 mg/dL (ref 125–200)
HDL: 48 mg/dL (ref >=40)
LDL Cholesterol: 81 mg/dL (ref <130)
TRIGLYCERIDES: 255 mg/dL — AB (ref <150)
Total CHOL/HDL Ratio: 3.8 Ratio (ref <=5.0)
VLDL: 51 mg/dL — AB (ref <30)

## 2015-12-25 MED ORDER — OXYCODONE-ACETAMINOPHEN 5-325 MG PO TABS: 1.0000 | ORAL_TABLET | Freq: Three times a day (TID) | ORAL | 0 refills | Status: DC | PRN

## 2015-12-25 MED ORDER — GABAPENTIN 600 MG PO TABS: 600.0000 mg | ORAL_TABLET | Freq: Two times a day (BID) | ORAL | 0 refills | Status: DC

## 2015-12-25 MED ORDER — LISINOPRIL 20 MG PO TABS: 20.0000 mg | ORAL_TABLET | Freq: Every day | ORAL | 3 refills | Status: DC

## 2015-12-25 MED ORDER — CLONAZEPAM 0.5 MG PO TABS: 0.5000 mg | ORAL_TABLET | Freq: Two times a day (BID) | ORAL | 0 refills | Status: DC | PRN

## 2015-12-25 NOTE — Assessment & Plan Note (Addendum)
Mildly elevated to 152/65. His creatinine is also up to 3.08 (from 2.4 about 2 months ago). I discontinued his hydrochlorothiazide today. I started lisinopril at 20 mg daily given history of proteinuria in CKD. Follow-up in 2 weeks. I will repeat his BMP in 2 weeks. If blood pressure not well controlled at that time, I will go up on his amlodipine to 10 mg daily.
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
## 2015-12-25 NOTE — Patient Instructions (Addendum)
It was great seeing you today! We have addressed the following issues today  1. Depression: continue taking your Lexapro 2. Anxiety: I refilled the Klonopin today. This take this medication only when you need it. Placed on [place]. Machinery while on this medication. This medication can make you sleepy or sedated espacially with the pain medications. 3. Blood pressure: Your blood pressure is 152/65 today. Your goal blood pressure is later 140/90. I have stopped the hydrochlorothiazide and started lisinopril. I recommend you come back and see [place] in 2 weeks to follow-up on the blood pressure 4. Neuropathy/nerve pain: I refilled her gabapentin 5. Please get your zoster vaccine and tetanus vaccine at pharmacy and bring the papers to [place].     If we did any lab work today, and the results require attention, either me or my nurse will get in touch with you. If everything is normal, you will get a letter in mail. If you don't hear from [place] in two weeks, please give [place] a call. Otherwise, I look forward to talking with you again at our next visit. If you have any questions or concerns before then, please call the clinic at [phone].  Please bring all your medications to every doctors visit   Sign up for My Chart to have easy access to your labs results, and communication with your Primary care physician.    Please check-out at the front desk before leaving the clinic.   Take Care,

## 2015-12-25 NOTE — Assessment & Plan Note (Signed)
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 No acute changes or red flags at this time. Percocet has been helping. Unfortunately, I was able to obtain his urine for UDS as he hasn't taken his Percocet in the last 2 days.  -Gave him refill for 90 tablets.  -UDS and pain contract next visit

## 2015-12-25 NOTE — Assessment & Plan Note (Signed)
Improved significantly. PHQ9 10 (was 25 about 2 months ago). Denies suicidal or homicidal ideation. Started Lexapro 10 mg daily about a month ago. He reports tolerating his medication well.  -Continue Lexapro 10 mg daily -Follow up as needed

## 2015-12-25 NOTE — Assessment & Plan Note (Signed)
Gave refill on clonazepam 0.5 mg twice a day as needed for anxiety attack, #20. Advised him to take only when needed. Warned about the side effect of this medication especially with his pain medication.

## 2015-12-26 ENCOUNTER — Emergency Department (HOSPITAL_BASED_OUTPATIENT_CLINIC_OR_DEPARTMENT_OTHER): Payer: Medicare Other

## 2015-12-26 ENCOUNTER — Emergency Department (HOSPITAL_COMMUNITY): Payer: Medicare Other

## 2015-12-26 ENCOUNTER — Other Ambulatory Visit: Payer: Self-pay

## 2015-12-26 ENCOUNTER — Encounter (HOSPITAL_COMMUNITY): Payer: Self-pay | Admitting: Physician Assistant

## 2015-12-26 ENCOUNTER — Other Ambulatory Visit (HOSPITAL_COMMUNITY): Payer: Self-pay

## 2015-12-26 ENCOUNTER — Encounter: Payer: Self-pay | Admitting: Student

## 2015-12-26 ENCOUNTER — Inpatient Hospital Stay (HOSPITAL_COMMUNITY)
Admission: EM | Admit: 2015-12-26 | Discharge: 2016-01-02 | DRG: 871 | Disposition: A | Discharge status: Home / Self Care | Payer: Medicare Other | Attending: Family Medicine | Admitting: Family Medicine

## 2015-12-26 DIAGNOSIS — R571 Hypovolemic shock: Secondary | ICD-10-CM

## 2015-12-26 DIAGNOSIS — I129 Hypertensive chronic kidney disease with stage 1 through stage 4 chronic kidney disease, or unspecified chronic kidney disease: Secondary | ICD-10-CM | POA: Diagnosis present

## 2015-12-26 DIAGNOSIS — R509 Fever, unspecified: Secondary | ICD-10-CM

## 2015-12-26 DIAGNOSIS — F329 Major depressive disorder, single episode, unspecified: Secondary | ICD-10-CM | POA: Diagnosis present

## 2015-12-26 DIAGNOSIS — F411 Generalized anxiety disorder: Secondary | ICD-10-CM | POA: Diagnosis present

## 2015-12-26 DIAGNOSIS — Z79891 Long term (current) use of opiate analgesic: Secondary | ICD-10-CM

## 2015-12-26 DIAGNOSIS — R079 Chest pain, unspecified: Secondary | ICD-10-CM

## 2015-12-26 DIAGNOSIS — E1322 Other specified diabetes mellitus with diabetic chronic kidney disease: Secondary | ICD-10-CM

## 2015-12-26 DIAGNOSIS — N179 Acute kidney failure, unspecified: Secondary | ICD-10-CM | POA: Diagnosis not present

## 2015-12-26 DIAGNOSIS — I472 Ventricular tachycardia: Secondary | ICD-10-CM | POA: Diagnosis present

## 2015-12-26 DIAGNOSIS — K5900 Constipation, unspecified: Secondary | ICD-10-CM | POA: Diagnosis present

## 2015-12-26 DIAGNOSIS — R579 Shock, unspecified: Secondary | ICD-10-CM

## 2015-12-26 DIAGNOSIS — E669 Obesity, unspecified: Secondary | ICD-10-CM | POA: Diagnosis present

## 2015-12-26 DIAGNOSIS — E8770 Fluid overload, unspecified: Secondary | ICD-10-CM | POA: Diagnosis present

## 2015-12-26 DIAGNOSIS — Z8249 Family history of ischemic heart disease and other diseases of the circulatory system: Secondary | ICD-10-CM

## 2015-12-26 DIAGNOSIS — E785 Hyperlipidemia, unspecified: Secondary | ICD-10-CM | POA: Diagnosis present

## 2015-12-26 DIAGNOSIS — E1142 Type 2 diabetes mellitus with diabetic polyneuropathy: Secondary | ICD-10-CM | POA: Diagnosis present

## 2015-12-26 DIAGNOSIS — Z794 Long term (current) use of insulin: Secondary | ICD-10-CM

## 2015-12-26 DIAGNOSIS — I214 Non-ST elevation (NSTEMI) myocardial infarction: Secondary | ICD-10-CM

## 2015-12-26 DIAGNOSIS — IMO0002 Reserved for concepts with insufficient information to code with codable children: Secondary | ICD-10-CM

## 2015-12-26 DIAGNOSIS — Z7982 Long term (current) use of aspirin: Secondary | ICD-10-CM

## 2015-12-26 DIAGNOSIS — Z951 Presence of aortocoronary bypass graft: Secondary | ICD-10-CM

## 2015-12-26 DIAGNOSIS — F1011 Alcohol abuse, in remission: Secondary | ICD-10-CM

## 2015-12-26 DIAGNOSIS — R9431 Abnormal electrocardiogram [ECG] [EKG]: Secondary | ICD-10-CM | POA: Diagnosis not present

## 2015-12-26 DIAGNOSIS — E872 Acidosis, unspecified: Secondary | ICD-10-CM

## 2015-12-26 DIAGNOSIS — A419 Sepsis, unspecified organism: Secondary | ICD-10-CM | POA: Diagnosis not present

## 2015-12-26 DIAGNOSIS — G8929 Other chronic pain: Secondary | ICD-10-CM | POA: Diagnosis present

## 2015-12-26 DIAGNOSIS — Z79899 Other long term (current) drug therapy: Secondary | ICD-10-CM

## 2015-12-26 DIAGNOSIS — T464X5A Adverse effect of angiotensin-converting-enzyme inhibitors, initial encounter: Secondary | ICD-10-CM | POA: Diagnosis present

## 2015-12-26 DIAGNOSIS — R109 Unspecified abdominal pain: Secondary | ICD-10-CM

## 2015-12-26 DIAGNOSIS — M549 Dorsalgia, unspecified: Secondary | ICD-10-CM | POA: Diagnosis present

## 2015-12-26 DIAGNOSIS — R55 Syncope and collapse: Secondary | ICD-10-CM

## 2015-12-26 DIAGNOSIS — E876 Hypokalemia: Secondary | ICD-10-CM | POA: Diagnosis present

## 2015-12-26 DIAGNOSIS — E1122 Type 2 diabetes mellitus with diabetic chronic kidney disease: Secondary | ICD-10-CM | POA: Diagnosis present

## 2015-12-26 DIAGNOSIS — I959 Hypotension, unspecified: Secondary | ICD-10-CM | POA: Diagnosis present

## 2015-12-26 DIAGNOSIS — E860 Dehydration: Secondary | ICD-10-CM | POA: Diagnosis present

## 2015-12-26 DIAGNOSIS — J189 Pneumonia, unspecified organism: Secondary | ICD-10-CM

## 2015-12-26 DIAGNOSIS — Z6830 Body mass index (BMI) 30.0-30.9, adult: Secondary | ICD-10-CM

## 2015-12-26 DIAGNOSIS — R262 Difficulty in walking, not elsewhere classified: Secondary | ICD-10-CM

## 2015-12-26 DIAGNOSIS — J9600 Acute respiratory failure, unspecified whether with hypoxia or hypercapnia: Secondary | ICD-10-CM | POA: Diagnosis present

## 2015-12-26 DIAGNOSIS — N184 Chronic kidney disease, stage 4 (severe): Secondary | ICD-10-CM

## 2015-12-26 DIAGNOSIS — R6510 Systemic inflammatory response syndrome (SIRS) of non-infectious origin without acute organ dysfunction: Secondary | ICD-10-CM

## 2015-12-26 DIAGNOSIS — I2510 Atherosclerotic heart disease of native coronary artery without angina pectoris: Secondary | ICD-10-CM | POA: Diagnosis present

## 2015-12-26 DIAGNOSIS — E1365 Other specified diabetes mellitus with hyperglycemia: Secondary | ICD-10-CM

## 2015-12-26 DIAGNOSIS — D638 Anemia in other chronic diseases classified elsewhere: Secondary | ICD-10-CM | POA: Diagnosis present

## 2015-12-26 DIAGNOSIS — Y95 Nosocomial condition: Secondary | ICD-10-CM | POA: Diagnosis present

## 2015-12-26 LAB — CBC WITH DIFFERENTIAL/PLATELET
BASOS PCT: 1 %
Basophils Absolute: 0.1 10*3/uL (ref 0.0–0.1)
EOS ABS: 0.1 10*3/uL (ref 0.0–0.7)
EOS PCT: 3 %
HCT: 30.4 % — ABNORMAL LOW (ref 39.0–52.0)
HEMOGLOBIN: 10.4 g/dL — AB (ref 13.0–17.0)
Lymphocytes Relative: 62 %
Lymphs Abs: 2.6 10*3/uL (ref 0.7–4.0)
MCH: 30.9 pg (ref 26.0–34.0)
MCHC: 34.2 g/dL (ref 30.0–36.0)
MCV: 90.2 fL (ref 78.0–100.0)
MONO ABS: 0.2 10*3/uL (ref 0.1–1.0)
MONOS PCT: 4 %
NEUTROS PCT: 30 %
Neutro Abs: 1.3 10*3/uL — ABNORMAL LOW (ref 1.7–7.7)
PLATELETS: 286 10*3/uL (ref 150–400)
RBC: 3.37 MIL/uL — ABNORMAL LOW (ref 4.22–5.81)
RDW: 12.9 % (ref 11.5–15.5)
WBC: 4.3 10*3/uL (ref 4.0–10.5)

## 2015-12-26 LAB — I-STAT CHEM 8, ED
BUN: 67 mg/dL — ABNORMAL HIGH (ref 6–20)
CALCIUM ION: 1 mmol/L — AB (ref 1.15–1.40)
CREATININE: 5.3 mg/dL — AB (ref 0.61–1.24)
Chloride: 106 mmol/L (ref 101–111)
GLUCOSE: 91 mg/dL (ref 65–99)
HCT: 31 % — ABNORMAL LOW (ref 39.0–52.0)
HEMOGLOBIN: 10.5 g/dL — AB (ref 13.0–17.0)
Potassium: 3.3 mmol/L — ABNORMAL LOW (ref 3.5–5.1)
Sodium: 135 mmol/L (ref 135–145)
TCO2: 15 mmol/L (ref 0–100)

## 2015-12-26 LAB — URINALYSIS, ROUTINE W REFLEX MICROSCOPIC
BILIRUBIN URINE: NEGATIVE
Glucose, UA: 100 mg/dL — AB
HGB URINE DIPSTICK: NEGATIVE
KETONES UR: NEGATIVE mg/dL
Nitrite: NEGATIVE
Specific Gravity, Urine: 1.02 (ref 1.005–1.030)
pH: 5.5 (ref 5.0–8.0)

## 2015-12-26 LAB — COMPREHENSIVE METABOLIC PANEL
ALT: 11 U/L — ABNORMAL LOW (ref 17–63)
ANION GAP: 16 — AB (ref 5–15)
AST: 15 U/L (ref 15–41)
Albumin: 3.3 g/dL — ABNORMAL LOW (ref 3.5–5.0)
Alkaline Phosphatase: 37 U/L — ABNORMAL LOW (ref 38–126)
BILIRUBIN TOTAL: 0.6 mg/dL (ref 0.3–1.2)
BUN: 69 mg/dL — AB (ref 6–20)
CHLORIDE: 107 mmol/L (ref 101–111)
CO2: 13 mmol/L — ABNORMAL LOW (ref 22–32)
Calcium: 7.6 mg/dL — ABNORMAL LOW (ref 8.9–10.3)
Creatinine, Ser: 5.1 mg/dL — ABNORMAL HIGH (ref 0.61–1.24)
GFR, EST AFRICAN AMERICAN: 12 mL/min — AB (ref >60)
GFR, EST NON AFRICAN AMERICAN: 11 mL/min — AB (ref >60)
Glucose, Bld: 88 mg/dL (ref 65–99)
POTASSIUM: 3.2 mmol/L — AB (ref 3.5–5.1)
SODIUM: 136 mmol/L (ref 135–145)
TOTAL PROTEIN: 5.5 g/dL — AB (ref 6.5–8.1)

## 2015-12-26 LAB — URINE MICROSCOPIC-ADD ON: RBC / HPF: NONE SEEN RBC/hpf (ref 0–5)

## 2015-12-26 LAB — DIC (DISSEMINATED INTRAVASCULAR COAGULATION)PANEL
D-Dimer, Quant: 1.94 ug/mL-FEU — ABNORMAL HIGH (ref 0.00–0.50)
Fibrinogen: 434 mg/dL (ref 210–475)
Prothrombin Time: 15.1 seconds (ref 11.4–15.2)
Smear Review: NONE SEEN

## 2015-12-26 LAB — I-STAT VENOUS BLOOD GAS, ED
ACID-BASE DEFICIT: 11 mmol/L — AB (ref 0.0–2.0)
Bicarbonate: 14.5 mmol/L — ABNORMAL LOW (ref 20.0–28.0)
O2 Saturation: 99 %
PH VEN: 7.301 (ref 7.250–7.430)
TCO2: 15 mmol/L (ref 0–100)
pCO2, Ven: 29.5 mmHg — ABNORMAL LOW (ref 44.0–60.0)
pO2, Ven: 128 mmHg — ABNORMAL HIGH (ref 32.0–45.0)

## 2015-12-26 LAB — I-STAT ARTERIAL BLOOD GAS, ED
ACID-BASE DEFICIT: 14 mmol/L — AB (ref 0.0–2.0)
ACID-BASE DEFICIT: 15 mmol/L — AB (ref 0.0–2.0)
Bicarbonate: 12.2 mmol/L — ABNORMAL LOW (ref 20.0–28.0)
Bicarbonate: 13.4 mmol/L — ABNORMAL LOW (ref 20.0–28.0)
O2 SAT: 63 %
O2 SAT: 93 %
PH ART: 7.207 — AB (ref 7.350–7.450)
PH ART: 7.235 — AB (ref 7.350–7.450)
PO2 ART: 33 mmHg — AB (ref 83.0–108.0)
PO2 ART: 68 mmHg — AB (ref 83.0–108.0)
Patient temperature: 34.2
Patient temperature: 34.4
TCO2: 13 mmol/L (ref 0–100)
TCO2: 15 mmol/L (ref 0–100)
pCO2 arterial: 28 mmHg — ABNORMAL LOW (ref 32.0–48.0)
pCO2 arterial: 32.6 mmHg (ref 32.0–48.0)

## 2015-12-26 LAB — I-STAT TROPONIN, ED: TROPONIN I, POC: 0.03 ng/mL (ref 0.00–0.08)

## 2015-12-26 LAB — CK: Total CK: 94 U/L (ref 49–397)

## 2015-12-26 LAB — DIC (DISSEMINATED INTRAVASCULAR COAGULATION) PANEL
APTT: 22 s — AB (ref 24–36)
INR: 1.18
PLATELETS: 293 10*3/uL (ref 150–400)

## 2015-12-26 LAB — HEPATITIS C ANTIBODY: HCV Ab: NEGATIVE

## 2015-12-26 LAB — BETA-HYDROXYBUTYRIC ACID: BETA-HYDROXYBUTYRIC ACID: 0.09 mmol/L (ref 0.05–0.27)

## 2015-12-26 LAB — HIV ANTIBODY (ROUTINE TESTING W REFLEX): HIV 1&2 Ab, 4th Generation: NONREACTIVE

## 2015-12-26 LAB — MAGNESIUM: MAGNESIUM: 1.7 mg/dL (ref 1.7–2.4)

## 2015-12-26 LAB — ECHOCARDIOGRAM COMPLETE

## 2015-12-26 LAB — GLUCOSE, CAPILLARY: Glucose-Capillary: 276 mg/dL — ABNORMAL HIGH (ref 65–99)

## 2015-12-26 LAB — TSH: TSH: 1.544 u[IU]/mL (ref 0.350–4.500)

## 2015-12-26 LAB — I-STAT CG4 LACTIC ACID, ED
Lactic Acid, Venous: 2.84 mmol/L (ref 0.5–1.9)
Lactic Acid, Venous: 1.46 mmol/L (ref 0.5–1.9)

## 2015-12-26 LAB — CBG MONITORING, ED: GLUCOSE-CAPILLARY: 78 mg/dL (ref 65–99)

## 2015-12-26 MED ORDER — SODIUM CHLORIDE 0.9 % IV BOLUS (SEPSIS)
4500.0000 mL | Freq: Once | INTRAVENOUS | Status: AC
  Administered 2015-12-26: 4500 mL via INTRAVENOUS
  Filled 2015-12-26: qty 4500

## 2015-12-26 MED ORDER — BISACODYL 10 MG RE SUPP: 10.0000 mg | Freq: Every day | RECTAL | Status: DC | PRN

## 2015-12-26 MED ORDER — CALCIUM ACETATE (PHOS BINDER) 667 MG PO CAPS
667.0000 mg | ORAL_CAPSULE | Freq: Three times a day (TID) | ORAL | Status: DC
  Administered 2015-12-27 – 2016-01-02 (×19): 667 mg via ORAL
  Filled 2015-12-26 (×19): qty 1

## 2015-12-26 MED ORDER — DEXTROSE 5 % IV SOLN
2.0000 g | Freq: Once | INTRAVENOUS | Status: DC
  Filled 2015-12-26: qty 2

## 2015-12-26 MED ORDER — ESCITALOPRAM OXALATE 10 MG PO TABS
10.0000 mg | ORAL_TABLET | Freq: Every day | ORAL | Status: DC
  Administered 2015-12-27: 10 mg via ORAL
  Filled 2015-12-26: qty 1

## 2015-12-26 MED ORDER — HEPARIN SODIUM (PORCINE) 5000 UNIT/ML IJ SOLN
5000.0000 [IU] | Freq: Three times a day (TID) | INTRAMUSCULAR | Status: DC
  Administered 2015-12-26 – 2015-12-28 (×7): 5000 [IU] via SUBCUTANEOUS
  Filled 2015-12-26 (×7): qty 1

## 2015-12-26 MED ORDER — THIAMINE HCL 100 MG/ML IJ SOLN
100.0000 mg | Freq: Every day | INTRAMUSCULAR | Status: DC
Start: 2015-12-26 — End: 2015-12-26
  Administered 2015-12-26: 100 mg via INTRAVENOUS
  Filled 2015-12-26: qty 2

## 2015-12-26 MED ORDER — DEXTROSE 5 % IV SOLN: 2.0000 g | Freq: Once | INTRAVENOUS | Status: DC

## 2015-12-26 MED ORDER — ATORVASTATIN CALCIUM 40 MG PO TABS
40.0000 mg | ORAL_TABLET | Freq: Every day | ORAL | Status: DC
  Administered 2015-12-27 – 2016-01-01 (×6): 40 mg via ORAL
  Filled 2015-12-26 (×6): qty 1

## 2015-12-26 MED ORDER — ASPIRIN EC 81 MG PO TBEC
81.0000 mg | DELAYED_RELEASE_TABLET | Freq: Every day | ORAL | Status: DC
  Administered 2015-12-27 – 2015-12-28 (×2): 81 mg via ORAL
  Filled 2015-12-26 (×2): qty 1

## 2015-12-26 MED ORDER — SODIUM BICARBONATE 8.4 % IV SOLN
INTRAVENOUS | Status: DC
Start: 2015-12-26 — End: 2015-12-26
  Filled 2015-12-26 (×3): qty 850

## 2015-12-26 MED ORDER — FENTANYL CITRATE (PF) 100 MCG/2ML IJ SOLN
25.0000 ug | INTRAMUSCULAR | Status: DC | PRN
  Administered 2015-12-26: 25 ug via INTRAVENOUS
  Filled 2015-12-26: qty 2

## 2015-12-26 MED ORDER — INSULIN ASPART 100 UNIT/ML ~~LOC~~ SOLN
4.0000 [IU] | Freq: Three times a day (TID) | SUBCUTANEOUS | Status: DC
  Administered 2015-12-27 – 2016-01-02 (×17): 4 [IU] via SUBCUTANEOUS

## 2015-12-26 MED ORDER — ALLOPURINOL 100 MG PO TABS
100.0000 mg | ORAL_TABLET | Freq: Every day | ORAL | Status: DC
  Administered 2015-12-27 – 2016-01-02 (×6): 100 mg via ORAL
  Filled 2015-12-26 (×6): qty 1

## 2015-12-26 MED ORDER — GABAPENTIN 600 MG PO TABS
600.0000 mg | ORAL_TABLET | Freq: Two times a day (BID) | ORAL | Status: DC
  Administered 2015-12-26 – 2015-12-30 (×8): 600 mg via ORAL
  Filled 2015-12-26 (×8): qty 1

## 2015-12-26 MED ORDER — INSULIN ASPART 100 UNIT/ML ~~LOC~~ SOLN
0.0000 [IU] | Freq: Three times a day (TID) | SUBCUTANEOUS | Status: DC
  Administered 2015-12-27: 3 [IU] via SUBCUTANEOUS
  Administered 2015-12-27: 5 [IU] via SUBCUTANEOUS
  Administered 2015-12-27: 3 [IU] via SUBCUTANEOUS
  Administered 2015-12-28: 5 [IU] via SUBCUTANEOUS
  Administered 2015-12-28 – 2015-12-29 (×4): 2 [IU] via SUBCUTANEOUS
  Administered 2015-12-30 (×3): 3 [IU] via SUBCUTANEOUS

## 2015-12-26 MED ORDER — FOLIC ACID 5 MG/ML IJ SOLN
1.0000 mg | Freq: Every day | INTRAMUSCULAR | Status: DC
  Administered 2015-12-26: 1 mg via INTRAVENOUS
  Filled 2015-12-26: qty 0.2

## 2015-12-26 MED ORDER — DEXTROSE-NACL 5-0.45 % IV SOLN
INTRAVENOUS | Status: DC
  Administered 2015-12-26 – 2015-12-27 (×3): via INTRAVENOUS

## 2015-12-26 MED ORDER — INSULIN DETEMIR 100 UNIT/ML ~~LOC~~ SOLN
35.0000 [IU] | Freq: Every day | SUBCUTANEOUS | Status: DC
  Administered 2015-12-26 – 2015-12-28 (×3): 35 [IU] via SUBCUTANEOUS
  Filled 2015-12-26 (×5): qty 0.35

## 2015-12-26 MED ORDER — SODIUM BICARBONATE 8.4 % IV SOLN
50.0000 meq | Freq: Once | INTRAVENOUS | Status: AC
  Administered 2015-12-26: 50 meq via INTRAVENOUS
  Filled 2015-12-26: qty 50

## 2015-12-26 MED ORDER — NOREPINEPHRINE BITARTRATE 1 MG/ML IV SOLN
0.0000 ug/min | INTRAVENOUS | Status: DC
  Administered 2015-12-26: 5 ug/min via INTRAVENOUS
  Filled 2015-12-26: qty 4

## 2015-12-26 MED ORDER — OXYCODONE-ACETAMINOPHEN 5-325 MG PO TABS
1.0000 | ORAL_TABLET | Freq: Three times a day (TID) | ORAL | Status: DC | PRN
  Administered 2015-12-26 – 2016-01-02 (×14): 1 via ORAL
  Filled 2015-12-26 (×14): qty 1

## 2015-12-26 MED ORDER — INSULIN ASPART 100 UNIT/ML ~~LOC~~ SOLN
0.0000 [IU] | Freq: Every day | SUBCUTANEOUS | Status: DC
  Administered 2015-12-26 – 2015-12-31 (×3): 3 [IU] via SUBCUTANEOUS
  Administered 2016-01-02: 2 [IU] via SUBCUTANEOUS

## 2015-12-26 MED ORDER — ASPIRIN 300 MG RE SUPP
300.0000 mg | Freq: Once | RECTAL | Status: DC
  Filled 2015-12-26: qty 1

## 2015-12-26 NOTE — Progress Notes (Signed)
  Echocardiogram 2D Echocardiogram has been performed.  Joseph Mcdonald 12/26/2015, 12:00 PM

## 2015-12-26 NOTE — ED Notes (Signed)
Fluids turned to [facility]

## 2015-12-26 NOTE — ED Notes (Signed)
Pt transported to CT, accompanied by Santiago Glad, Therapist, sports and Marylyn Ishihara, EMT. Cardiac monitoring, BP monitoring, pulse ox monitoring, Zoll pads on pt.

## 2015-12-26 NOTE — ED Notes (Signed)
Resident at the bedside

## 2015-12-26 NOTE — ED Notes (Signed)
Pt is alert/oriented x 4, is able to carry on a conversation. Family at bedside--

## 2015-12-26 NOTE — ED Triage Notes (Signed)
Per EMS - pt c/o abd cramping starting yesterday. 0900 today, pt "did not feel right" and had periods of syncope epr wife. Pt pale/diaphoretic upon EMS arrival. Initial BP 64/30, given 1200 fluid, last BP 70/40. SB and bigeminy on monitor. Hx open heart surgery, diabetes, HTN, high cholesterol, stage 4 kidney disease.

## 2015-12-26 NOTE — Consult Note (Signed)
CARDIOLOGY CONSULT NOTE   Patient ID: Joseph Mcdonald MRN: [id number] DOB/AGE: Feb 12, 1951 65 y.o.  Admit date: 12/26/2015  Primary Physician   Mercy Riding, MD Primary Cardiologist   Dr Harrington Challenger 2014 Reason for Consultation   abnl ECG Requesting MD: Dr Laneta Simmers  [id number] Bradyn Cruse is a 65 y.o. 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 CKD IV, HTN, DM, obesity, depression. Has a nephrologist in [place].  Saw his primary MD 12/25/2015 and his Cr was elevated, 2.4>>3.08. His HCTZ was d/c'd and lisinopril 20 mg was started.   Pt was in [place] this am, was at [facility] and his wife noticed decreased LOC. His CBG was fine this am and BP was elevated. He took all scheduled am rx. At ER, his EF was normal by quick-look echo, no CP, but ECG significantly abnl and cards asked to see. He was hypotensive on the way in by EMS with SBP 60s and complaining of severe abdominal pain going through to his back (CT w/out contrast negative for acute process)  Mr Partida is groggy but responds appropriately to verbal stimulus. He denies chest pain, but c/o abdominal and back pain. Unable to rate. Abdomen is tender.    Past Medical History:  Diagnosis Date  . Anxiety   . Depression   . Diabetes mellitus without complication ([place])   . Hypertension   . Peripheral neuropathy [facility])      Past Surgical History:  Procedure Laterality Date  . CORONARY ARTERY BYPASS GRAFT      Allergies  Allergen Reactions  . Penicillins Hives and Itching    I have reviewed the patient's current medications . aspirin  300 mg Rectal Once  . folic acid  1 mg Intravenous Daily  . sodium bicarbonate  50 mEq Intravenous Once  . thiamine injection  100 mg Intravenous Daily   . ceFEPime (MAXIPIME) IV    . norepinephrine (LEVOPHED) Adult infusion 5 mcg/min (12/26/15 1143)  .  sodium bicarbonate 150 mEq in sterile water 1000 mL infusion     Prior to  Admission medications   Medication Sig Start Date End Date Taking? Authorizing Provider  ACCU-CHEK AVIVA PLUS test strip CHECK SUGAR 3 TIMES A DAY 07/25/15   Historical Provider, MD  allopurinol (ZYLOPRIM) 100 MG tablet Take 100 mg by mouth daily. 09/25/15   Historical Provider, MD  amLODipine (NORVASC) 5 MG tablet Take 5 mg by mouth 2 (two) times daily.  09/25/15   Historical Provider, MD  aspirin 81 MG tablet Take 1 tablet (81 mg total) by mouth daily. 03/01/13   Fay Records, MD  clonazePAM (KLONOPIN) 0.5 MG tablet Take 1 tablet (0.5 mg total) by mouth 2 (two) times daily as needed for anxiety. 12/25/15   Mercy Riding, MD  escitalopram (LEXAPRO) 10 MG tablet Take 1 tablet (10 mg total) by mouth daily. 12/17/15   Asiyah Cletis Media, MD  gabapentin (NEURONTIN) 600 MG tablet Take 1 tablet (600 mg total) by mouth 2 (two) times daily. 12/25/15   Mercy Riding, MD  hydrALAZINE (APRESOLINE) 50 MG tablet Take 50 mg by mouth 3 (three) times daily. 09/25/15   Historical Provider, MD  LEVEMIR FLEXTOUCH 100 UNIT/ML Pen Inject 75 Units into the skin at bedtime. 07/31/15   Historical Provider, MD  lisinopril (PRINIVIL,ZESTRIL) 20 MG tablet Take 1 tablet (20 mg total) by mouth daily. 12/25/15   Taye T  Cyndia Skeeters, MD  NOVOLOG FLEXPEN 100 UNIT/ML FlexPen Inject 30 Units into the skin See admin instructions. 3 times daily with meals per sliding scale 08/01/15   Historical Provider, MD  oxyCODONE-acetaminophen (ROXICET) 5-325 MG tablet Take 1 tablet by mouth every 8 (eight) hours as needed for severe pain. 12/25/15   Mercy Riding, MD  simvastatin (ZOCOR) 80 MG tablet  09/25/15   Historical Provider, MD     Social History   Social History  . Marital status: Married    Spouse name: N/A  . Number of children: N/A  . Years of education: N/A   Occupational History  . Disabled/Retired    Social History Main Topics  . Smoking status: Never Smoker  . Smokeless tobacco: Not on file  . Alcohol use No  . Drug use: Unknown  . Sexual  activity: Not on file   Other Topics Concern  . Not on file   Social History Narrative   Pt lives in [place] with wife.  He  Has 13 children.  He is disabled from [place].    Family Status  Relation Status  . Father Deceased  . Brother Deceased  . Brother Alive   Family History  Problem Relation Age of Onset  . Heart attack Father   . Heart attack Brother   . Heart attack Brother      ROS:  Full 14 point review of systems complete and found to be negative unless listed above.  Physical Exam: Blood pressure (!) 89/52, pulse 67, temperature (!) 93.6 F (34.2 C), temperature source Core (Comment), resp. rate 25, SpO2 97 %.  General: Well developed, well nourished, male in moderate distress Head: Eyes PERRLA, No xanthomas.   Normocephalic and atraumatic, oropharynx without edema or exudate. Dentition: poor Lungs: upper airway congestion, few lower rales Heart: HRRR S1 S2, no rub/gallop, no murmur. pulses are 2+ all 4 extrem.   Neck: No carotid bruits. No lymphadenopathy.  JVD not elevated but difficult to assess 2nd body habitus Abdomen: Bowel sounds present, abdomen distended, firm and tender without masses or hernias noted. Msk:  No spine or cva tenderness. No weakness, no joint deformities or effusions. Extremities: No clubbing or cyanosis. No edema.  Neuro: Oriented X 2. No focal deficits noted. Skin: No rashes or lesions noted.  Labs:   Lab Results  Component Value Date   WBC 4.3 12/26/2015   HGB 10.4 (L) 12/26/2015   HCT 30.4 (L) 12/26/2015   MCV 90.2 12/26/2015   PLT 293 12/26/2015   PLT 286 12/26/2015    Recent Labs  12/26/15 1022  INR 1.18     Recent Labs Lab 12/26/15 1022  NA 136  K 3.2*  CL 107  CO2 13*  BUN 69*  CREATININE 5.10*  CALCIUM 7.6*  PROT 5.5*  BILITOT 0.6  ALKPHOS 37*  ALT 11*  AST 15  GLUCOSE 88  ALBUMIN 3.3*    No results for input(s): CKTOTAL, CKMB, TROPONINI in the last 72 hours.  Recent Labs  12/26/15 1017    TROPIPOC 0.03   Lab Results  Component Value Date   CHOL 180 12/25/2015   HDL 48 12/25/2015   LDLCALC 81 12/25/2015   TRIG 255 (H) 12/25/2015   Lab Results  Component Value Date   DDIMER 1.94 (H) 12/26/2015    Echo: done, results pending. Preliminary results are normal EF  ECG:  10/03 10:54 SR with inferolateral ST depression, much worse than initial ECG at 10:09  Cath: none  since pre-CABG  Radiology:  Ct Abdomen Pelvis Wo Contrast Result Date: 12/26/2015 CLINICAL DATA:  Abdominal bloating, shortness of breath. Concern for sepsis. End-stage renal disease. EXAM: CT ABDOMEN AND PELVIS WITHOUT CONTRAST TECHNIQUE: Multidetector CT imaging of the abdomen and pelvis was performed following the standard protocol without IV contrast. COMPARISON:  None. FINDINGS: Lower chest: Left lower lobe opacities, likely atelectasis. Linear right base atelectasis. No effusions. Heart is mildly enlarged. Hepatobiliary: No focal hepatic abnormality. Gallbladder unremarkable. Pancreas: No focal abnormality or ductal dilatation. Spleen: No focal abnormality.  Normal size. Adrenals/Urinary Tract: Scattered low-density and isodense areas within the kidneys bilaterally, shown on prior ultrasounds represent cysts. No hydronephrosis. Adrenal glands can't urinary bladder grossly unremarkable. Stomach/Bowel: Moderate stool burden in the rectosigmoid colon. Stomach is mildly distended with fluid/ food. Evidence of bowel obstruction. Vascular/Lymphatic: Aortic and iliac calcifications. No aneurysm. No adenopathy. Reproductive: Prostate mildly prominent. No visible focal abnormality. Other: No free fluid or free air.  No focal fluid collection. Musculoskeletal: No acute bony abnormality or focal bone lesion. IMPRESSION: Bibasilar opacities, left greater than right, likely atelectasis. Mild cardiomegaly, coronary artery disease, aortoiliac atherosclerosis. Mildly distended stomach.  No evidence of bowel obstruction. No acute  findings in the abdomen or pelvis. Electronically Signed   By: Rolm Baptise M.D.   On: 12/26/2015 11:01   Dg Chest Portable 1 View Result Date: 12/26/2015 CLINICAL DATA:  65 year old male with history of hypotension and abdominal cramping for the past 2 days. EXAM: PORTABLE CHEST 1 VIEW COMPARISON:  Chest x-ray 01/03/2010. FINDINGS: Low lung volumes. No acute consolidative airspace disease. No pleural effusions. Mild cardiomegaly. Cephalization of the pulmonary vasculature with indistinctness of the interstitial markings, suggestive of underlying interstitial pulmonary edema. Aortic atherosclerosis. Status post median sternotomy for CABG. Transcutaneous defibrillator pads are seen projecting over the lower left hemithorax. IMPRESSION: 1. The appearance the chest suggests mild congestive heart failure. 2. Low lung volumes. 3. Aortic atherosclerosis. Electronically Signed   By: Vinnie Langton M.D.   On: 12/26/2015 11:09    ASSESSMENT AND PLAN:   The patient was seen today by Dr [place], the patient evaluated and the data reviewed.   Principal Problem:   Shock ([place]) - CCM has been called to manage - type unclear - had lisinopril 20 mg this am, new rx - since arrival, has had over 6 L IVF, need to watch volume carefully - agree with Levophed to help improve BP  Active Problems:   Acute renal failure (ARF) ([place]) - per CCM +/- Nephrology consult    Type 2 diabetes mellitus ([place]) - per CCM - CBG was not low this am, big breakfast    Abnormal ECG - mild ST changes initially, worsened a short time later - no chest pain, initial ez negative and EF normal by echo  - EMS strips show 1st deg AVB w/ frequent PVCs - if troponin becomes elevated, ck CKMB also    Hypokalemia - K+ 3.2 this am - was on HCTZ till today - with Cr > 5, will not supplement now, leave to CCM  SignedRosaria Ferries, PA-C 12/26/2015 12:01 PM Beeper [id number]  Co-Sign MD Patient seen and examined and history  reviewed. Agree with above findings and plan. Patient seen for evaluation of abnormal Ecg. Presented with progressive weakness and near syncope. Was hypotensive. Found to be acidotic and have acute on chronic renal failure. Ecg initially was benign but repeat Ecg showed diffuse ST depression consistent with global ischemia. Patient is s/p CABG in  2011. Has not had regular cardiac follow up. Denies any current chest pain but notes abdominal discomfort.  Patient seen in Family practice center yesterday. Noted creatinine was elevated 2.4>>3.0. HCTZ stopped but started on lisinopril 20 mg daily Bedside Echo shows normal LV function and flat IVC.  I suspect Ecg changes related to hypotension resulting in global ischemia. He is asymptomatic with this. Echo looks good. Recommend supportive care with correction of hypotension. Cycle cardiac enzymes and ecg. Suspect shock and ARF in part related to addition of ACEi yesterday.  Rida Loudin Martinique, [place] 12/26/2015 12:36 PM

## 2015-12-26 NOTE — ED Notes (Signed)
FAST negative per Dr. Laneta Simmers.

## 2015-12-26 NOTE — ED Notes (Signed)
BEDSIDE ECHO DONE

## 2015-12-26 NOTE — Progress Notes (Addendum)
CCMD called about pt having occasional PVC's and missed beats earlier. Pt denied chest pain, VS stable, HR 58. MD notified at 2329. Family medicine doctor called back at 2332, instruction given to further monitor patient.

## 2015-12-26 NOTE — ED Notes (Signed)
1,230ml given by EMS. 4,000 NS given since arrival. 2,077ml warmed LR infusing at this time.

## 2015-12-26 NOTE — ED Notes (Signed)
WIFE -- Angie -- [phone] Daughter -- Lattie Haw -- [phone]

## 2015-12-26 NOTE — ED Provider Notes (Signed)
I saw and evaluated the patient, reviewed the resident's note and I agree with the findings and plan. Please see associated encounter note.   EKG Interpretation  Date/Time:  Tuesday December 26 2015 10:09:33 EDT Ventricular Rate:  76 PR Interval:    QRS Duration: 113 QT Interval:  490 QTC Calculation: 551 R Axis:   9 Text Interpretation:  Sinus rhythm Multiple ventricular premature complexes Prolonged PR interval Incomplete left bundle branch block Anterior Q waves, possibly due to ILBBB Prolonged QT interval ST-t wave abnormality Lateral leads New since previous tracing Confirmed by Wilburt Messina MD, Amiee Wiley [phone]) on 12/26/2015 10:27:44 AM       EKG Interpretation  Date/Time:  Tuesday December 26 2015 10:34:41 EDT Ventricular Rate:  72 PR Interval:    QRS Duration: 110 QT Interval:  468 QTC Calculation: 513 R Axis:   -31 Text Interpretation:  Sinus rhythm Short PR interval Left axis deviation Anteroseptal infarct, age indeterminate Repol abnrm, severe global ischemia (LM/MVD) Prolonged QT interval Baseline wander in lead(s) V2 likely reperfusion changes with improvement in BP Confirmed by Atley Scarboro MD, Quillian Quince [id number]) on 12/26/2015 10:45:56 AM        CRITICAL CARE Performed by: Leo Grosser Total critical care time: 30 minutes Critical care time was exclusive of separately billable procedures and treating other patients. Critical care was necessary to treat or prevent imminent or life-threatening deterioration. Critical care was time spent personally by me on the following activities: development of treatment plan with patient and/or surrogate as well as nursing, discussions with consultants, evaluation of patient's response to treatment, examination of patient, obtaining history from patient or surrogate, ordering and performing treatments and interventions, ordering and review of laboratory studies, ordering and review of radiographic studies, pulse oximetry and re-evaluation of patient's  condition.  Emergency Focused Ultrasound Exam Limited Ultrasound Assessment for the evaluation of Hypotension ([place] PROTOCOL)  Performed and interpreted by Dr. Laneta Simmers Indication: Hypotension Multiple images of the bilateral lungs, heart, inferior vena cava, abdomen, and abdominal aorta are obtained for the purposes of estimating presence/absence of pneumothorax, cardiac contractility, volume status, abdominal free fluid and aortic aneurysm with a multifrequency probe. Findings: + B lines and sliding lung, no anechoic fluid in abdomen, nml cardiac contractility, no anechoic fluid surrounding heart, complete IVC collapse without filling, no aortic dilation Interpretation: no pneumothorax, no hemoperitoneum, no pericardial effusion, severely depressed CVP, no abdominal aortic aneurysm Images archived electronically.  CPT Codes: thorax [id number],  cardiac [id number], abdomen [id number], limited retroperitoneal [phone] (study includes all codes)  65 y.o. male presents with lower abdominal pain and back pain with hypotension with concern by EMS for AAA. Alerted vascular surgery prior to arrival in case emergent surgical management indicated. No AAA on bedside US on arrival, no indication for vascular consult. No evidence of hemorrhage. Pt with signs of severe volume depletion and aggressively fluid resuscitated with good response of IVC filling and BP. ARF with acidosis will require ongoing resuscitation and close monitoring in IP setting. Critical care consult with Pt having some refractory hypotension that is responsive to bicarb and warming measures. No clear source of infection. CT abdomen/pelvis unrevealing. Family med to admit with CC consult.     Leo Grosser, MD 12/26/15 2217

## 2015-12-26 NOTE — ED Provider Notes (Signed)
[facility] DEPT Provider Note  CSN: [id number] Arrival Date & Time: 12/26/15 @ 12  History    Chief Complaint Chief Complaint  Patient presents with  . Hypotension  . Abdominal Pain    HPI Joseph Mcdonald is a 65 y.o. male.  Patient presents lethargic and significantly diaphoretic with EMS after patient recurrent syncope today. Per patient, had significant abdominal pain w/ nausea. Significant hypotension w/ EMS who report BP to palpation only at scene. Never unresponsive with EMS. BG 100s. PMH involves previous CABG in 2011 ago w/ diabetes type II, HTN, HLD, CKD and depression. Takes insulin. Recent admission to hospital for alcohol withdrawal from 7/19 to 7/21 of 2017. Baseline Cr at around 1.6. Patient BP unable to be read by BP cuff. IVC completely collapsible and shortly after several liters IVC appropriate.  Past Medical & Surgical History    Past Medical History:  Diagnosis Date  . Anxiety   . Depression   . Diabetes mellitus without complication ([place])   . Hypertension   . Peripheral neuropathy [facility])    Patient Active Problem List   Diagnosis Date Noted  . Shock ([place]) 12/26/2015  . Abnormal ECG 12/26/2015  . Acute renal failure (ARF) ([place]) 12/26/2015  . Secondary hyperparathyroidism of renal origin ([place]) 12/19/2015  . Anemia of chronic disease 12/19/2015  . Chronic back pain 11/20/2015  . Depression 10/20/2015  . Generalized anxiety disorder 10/20/2015  . Alcohol withdrawal ([place]) 10/11/2015  . Type 2 diabetes mellitus ([place]) 10/11/2015  . CKD (chronic kidney disease) stage 3, GFR 30-59 ml/min 10/11/2015  . Hyperlipidemia 01/18/2010  . Essential hypertension 01/18/2010  . Diabetic neuropathy ([place]) 01/17/2010  . CAD 01/17/2010   Past Surgical History:  Procedure Laterality Date  . CORONARY ARTERY BYPASS GRAFT      Family & Social History    Family History  Problem Relation Age of Onset  . Heart attack Father   . Heart attack Brother   .  Heart attack Brother    Social History  Substance Use Topics  . Smoking status: Never Smoker  . Smokeless tobacco: Not on file  . Alcohol use No    Home Medications    Prior to Admission medications   Medication Sig Start Date End Date Taking? Authorizing Provider  allopurinol (ZYLOPRIM) 100 MG tablet Take 100 mg by mouth daily. 09/25/15  Yes Historical Provider, MD  amLODipine (NORVASC) 5 MG tablet Take 5 mg by mouth 2 (two) times daily.  09/25/15  Yes Historical Provider, MD  aspirin 81 MG tablet Take 1 tablet (81 mg total) by mouth daily. 03/01/13  Yes Fay Records, MD  calcium acetate (PHOSLO) 667 MG capsule Take 667 mg by mouth 3 (three) times daily with meals. 12/13/15  Yes Historical Provider, MD  clonazePAM (KLONOPIN) 0.5 MG tablet Take 1 tablet (0.5 mg total) by mouth 2 (two) times daily as needed for anxiety. 12/25/15  Yes Mercy Riding, MD  escitalopram (LEXAPRO) 10 MG tablet Take 1 tablet (10 mg total) by mouth daily. 12/17/15  Yes Asiyah Cletis Media, MD  gabapentin (NEURONTIN) 600 MG tablet Take 1 tablet (600 mg total) by mouth 2 (two) times daily. 12/25/15  Yes Mercy Riding, MD  hydrALAZINE (APRESOLINE) 50 MG tablet Take 50 mg by mouth 3 (three) times daily. 09/25/15  Yes Historical Provider, MD  LEVEMIR FLEXTOUCH 100 UNIT/ML Pen Inject 75 Units into the skin at bedtime. 07/31/15  Yes Historical Provider, MD  lisinopril (PRINIVIL,ZESTRIL) 20 MG tablet Take  1 tablet (20 mg total) by mouth daily. 12/25/15  Yes Mercy Riding, MD  NOVOLOG FLEXPEN 100 UNIT/ML FlexPen Inject 30 Units into the skin See admin instructions. 3 times daily with meals per sliding scale 08/01/15  Yes Historical Provider, MD  oxyCODONE-acetaminophen (ROXICET) 5-325 MG tablet Take 1 tablet by mouth every 8 (eight) hours as needed for severe pain. 12/25/15  Yes Mercy Riding, MD  simvastatin (ZOCOR) 80 MG tablet  09/25/15  Yes Historical Provider, MD    Allergies    Penicillins  I reviewed & agree with nursing's  documentation on the patient's past medical, surgical, social & family histories as well as their allergies.  Review of Systems  Complete ROS obtained, and is negative except as stated in HPI.  Physical Exam  Updated Vital Signs BP (!) 89/52   Pulse 67   Temp (!) 93.6 F (34.2 C) (Core (Comment))   Resp 25   SpO2 97%  I have reviewed the triage vital signs and the nursing notes. Physical Exam CONST: Patient dehydrated, ill-appearing.  EYES: PERRLA. EOMI. Conjunctiva w/o d/c. Lids AT w/o swelling.  ENMT: External Nares & Ears AT w/o swelling. Oropharynx patent. MM dry.  NECK: ROM full w/o rigidity. Trachea midline. JVD absent.  CVS: +S1/S2 w/o obvious murmur. Lower extremities w/o pitting edema.  RESP: Respiratory effort unlabored w/o retractions & accessory muscle use. BS clear bilaterally.  GI: Soft & ND. +BS x 4. TTP generalized. Hernia absent. Guarding & Rebound absent.  BACK: CVA TTP absent bilaterally.  SKIN: Skin cool and clammy. Turgor poor. No rash.  PSYCH: Alert. Oriented. Affect and mood appropriate.  NEURO: CN II-XII grossly intact. Motor exam symmetric w/ upper & lower extremities 5/5 bilaterally. Sensation grossly intact.  MSK: Joints located & stable, w/o obvious dislocation & obvious deformity or crepitus absent w/ Cap refill < 2 sec. Peripheral pulses 2+ & equal in all extremities.   ED Treatments & Results   Labs (only abnormal results are displayed) Labs Reviewed  DIC (DISSEMINATED INTRAVASCULAR COAGULATION) PANEL - Abnormal; Notable for the following:       Result Value   aPTT 22 (*)    D-Dimer, Quant 1.94 (*)    All other components within normal limits  COMPREHENSIVE METABOLIC PANEL - Abnormal; Notable for the following:    Potassium 3.2 (*)    CO2 13 (*)    BUN 69 (*)    Creatinine, Ser 5.10 (*)    Calcium 7.6 (*)    Total Protein 5.5 (*)    Albumin 3.3 (*)    ALT 11 (*)    Alkaline Phosphatase 37 (*)    GFR calc non Af Amer 11 (*)    GFR calc  Af Amer 12 (*)    Anion gap 16 (*)    All other components within normal limits  CBC WITH DIFFERENTIAL/PLATELET - Abnormal; Notable for the following:    RBC 3.37 (*)    Hemoglobin 10.4 (*)    HCT 30.4 (*)    Neutro Abs 1.3 (*)    All other components within normal limits  URINALYSIS, ROUTINE W REFLEX MICROSCOPIC (NOT AT [facility]) - Abnormal; Notable for the following:    APPearance CLOUDY (*)    Glucose, UA 100 (*)    Protein, ur >300 (*)    Leukocytes, UA TRACE (*)    All other components within normal limits  URINE MICROSCOPIC-ADD ON - Abnormal; Notable for the following:    Squamous Epithelial /  LPF 0-5 (*)    Bacteria, UA FEW (*)    Casts GRANULAR CAST (*)    All other components within normal limits  I-STAT CG4 LACTIC ACID, ED - Abnormal; Notable for the following:    Lactic Acid, Venous 2.84 (*)    All other components within normal limits  I-STAT CHEM 8, ED - Abnormal; Notable for the following:    Potassium 3.3 (*)    BUN 67 (*)    Creatinine, Ser 5.30 (*)    Calcium, Ion 1.00 (*)    Hemoglobin 10.5 (*)    HCT 31.0 (*)    All other components within normal limits  I-STAT VENOUS BLOOD GAS, ED - Abnormal; Notable for the following:    pCO2, Ven 29.5 (*)    pO2, Ven 128.0 (*)    Bicarbonate 14.5 (*)    Acid-base deficit 11.0 (*)    All other components within normal limits  I-STAT ARTERIAL BLOOD GAS, ED - Abnormal; Notable for the following:    pH, Arterial 7.207 (*)    pO2, Arterial 33.0 (*)    Bicarbonate 13.4 (*)    Acid-base deficit 14.0 (*)    All other components within normal limits  I-STAT ARTERIAL BLOOD GAS, ED - Abnormal; Notable for the following:    pH, Arterial 7.235 (*)    pCO2 arterial 28.0 (*)    pO2, Arterial 68.0 (*)    Bicarbonate 12.2 (*)    Acid-base deficit 15.0 (*)    All other components within normal limits  URINE CULTURE  CULTURE, BLOOD (ROUTINE X 2)  CULTURE, BLOOD (ROUTINE X 2)  BETA-HYDROXYBUTYRIC ACID  BLOOD GAS, VENOUS  MAGNESIUM   BLOOD GAS, ARTERIAL  I-STAT TROPOININ, ED  CBG MONITORING, ED  I-STAT CG4 LACTIC ACID, ED  TYPE AND SCREEN    EKG    EKG Interpretation  Date/Time:  Tuesday December 26 2015 10:34:41 EDT Ventricular Rate:  72 PR Interval:    QRS Duration: 110 QT Interval:  468 QTC Calculation: 513 R Axis:   -31 Text Interpretation:  Sinus rhythm Short PR interval Left axis deviation Anteroseptal infarct, age indeterminate Repol abnrm, severe global ischemia (LM/MVD) Prolonged QT interval Baseline wander in lead(s) V2 likely reperfusion changes with improvement in BP Confirmed by KNOTT MD, DANIEL [id number]) on 12/26/2015 10:45:56 AM       Radiology Ct Abdomen Pelvis Wo Contrast  Result Date: 12/26/2015 CLINICAL DATA:  Abdominal bloating, shortness of breath. Concern for sepsis. End-stage renal disease. EXAM: CT ABDOMEN AND PELVIS WITHOUT CONTRAST TECHNIQUE: Multidetector CT imaging of the abdomen and pelvis was performed following the standard protocol without IV contrast. COMPARISON:  None. FINDINGS: Lower chest: Left lower lobe opacities, likely atelectasis. Linear right base atelectasis. No effusions. Heart is mildly enlarged. Hepatobiliary: No focal hepatic abnormality. Gallbladder unremarkable. Pancreas: No focal abnormality or ductal dilatation. Spleen: No focal abnormality.  Normal size. Adrenals/Urinary Tract: Scattered low-density and isodense areas within the kidneys bilaterally, shown on prior ultrasounds represent cysts. No hydronephrosis. Adrenal glands can't urinary bladder grossly unremarkable. Stomach/Bowel: Moderate stool burden in the rectosigmoid colon. Stomach is mildly distended with fluid/ food. Evidence of bowel obstruction. Vascular/Lymphatic: Aortic and iliac calcifications. No aneurysm. No adenopathy. Reproductive: Prostate mildly prominent. No visible focal abnormality. Other: No free fluid or free air.  No focal fluid collection. Musculoskeletal: No acute bony abnormality or focal  bone lesion. IMPRESSION: Bibasilar opacities, left greater than right, likely atelectasis. Mild cardiomegaly, coronary artery disease, aortoiliac atherosclerosis. Mildly distended stomach.  No evidence of bowel obstruction. No acute findings in the abdomen or pelvis. Electronically Signed   By: Rolm Baptise M.D.   On: 12/26/2015 11:01   Dg Chest Portable 1 View  Result Date: 12/26/2015 CLINICAL DATA:  65 year old male with history of hypotension and abdominal cramping for the past 2 days. EXAM: PORTABLE CHEST 1 VIEW COMPARISON:  Chest x-ray 01/03/2010. FINDINGS: Low lung volumes. No acute consolidative airspace disease. No pleural effusions. Mild cardiomegaly. Cephalization of the pulmonary vasculature with indistinctness of the interstitial markings, suggestive of underlying interstitial pulmonary edema. Aortic atherosclerosis. Status post median sternotomy for CABG. Transcutaneous defibrillator pads are seen projecting over the lower left hemithorax. IMPRESSION: 1. The appearance the chest suggests mild congestive heart failure. 2. Low lung volumes. 3. Aortic atherosclerosis. Electronically Signed   By: Vinnie Langton M.D.   On: 12/26/2015 11:09    Pertinent labs & imaging results that were available during my care of the patient were independently visualized by me and considered in my medical decision making, please see chart for details.  Procedures (including critical care time) Procedures  Medications Ordered in ED Medications  thiamine (B-1) injection 100 mg (100 mg Intravenous Given [id number])  folic acid injection 1 mg (1 mg Intravenous Given 12/26/15 1157)  norepinephrine (LEVOPHED) 4 mg in dextrose 5 % 250 mL (0.016 mg/mL) infusion (0 mcg/min Intravenous Stopped 12/26/15 1322)  sodium bicarbonate 150 mEq in sterile water 1,000 mL infusion (not administered)  ceFEPIme (MAXIPIME) 2 g in dextrose 5 % 50 mL IVPB (not administered)  sodium chloride 0.9 % bolus 4,500 mL (0 mLs Intravenous  Stopped 12/26/15 1156)  sodium bicarbonate injection 50 mEq (50 mEq Intravenous Given 12/26/15 1312)    Initial Impression & Plan / ED Course & Results / Final Disposition   Initial Impression & Plan Patient arrives responsive however blood pressure is unable to be read with blood pressure machine upon manual is 76 over palp. Patient is cool clammy and little exam is concerning for shock at this time. Patient immediately given 6 L which were administered by pressure bag device given patient's IVC is completely flat and patient has no evidence of pericardial effusion or JVD distention and patient's volume status is most likely severely depleted.  Patient had rush protocol performed and identified no obvious AAA no obvious pneumothorax no pericardial effusion and cardiac squeeze appropriate without intra-peritoneal hemorrhage or fluid identified. Screening laboratory work sent along with cardiac labs and other sepsis labs. Due to concerns for intra-abdominal emergency CT abdomen and pelvis without contrast was obtained given patient's acute kidney injury.  Consultation was had from cardiology to 2 serial EKGs with concerning diffuse ST segment changes and aVR ST segment elevation. Patient has no criteria for ST segment elevation myocardial infarction at this time however.  ED Course & Results Following intravenous fluid resuscitation patient's blood pressure now 110 over 60s. Mental status improved however patient still appears diaphoretic. Lactic acidosis of 2.84 along with creatinine of 5.3 hemoglobin of 10.5 blood glucose of 78, white blood cell count of 4.3 and troponin 0.03.  After cardiology came to bedside and evaluated the patient and examined serial EKGs did discussion with her service decision was made the patient is not candidate for Cath Lab at this time as cardiology does not believe etiology is cardiac in nature.  I therefore consulted the MICU Service for assistance in the patient's  evaluation & care. We discussed the patient's H&P & ED course along w/ pertinent imaging and  lab results. Based upon that discussion, they have agreed to evaluate the patient. Please see their documentation for their full evaluation & recommendations. After evaluating the patient, they do not believe the patient will require ICU level of care and instead believe the patient more appropriate for step-down level of care.  Final Disposition Reassessment of the patient reveals they are HD stable and will be admitted to [facility]. Stable for transport at 2:31 PM upon admission.  Final Clinical Impression & ED Diagnoses   1. AKI (acute kidney injury) ([place])   2. Hypovolemic shock ([place])   3. ST segment depression   4. Metabolic acidosis    Patient care discussed with the attending physician, Dr. Laneta Simmers, who oversaw their evaluation & treatment & voiced agreement.  Note: This document was prepared using Dragon voice recognition software and may include unintentional dictation errors.  House Officer: Voncille Lo, MD, Emergency Medicine Resident.   Voncille Lo, MD 12/26/15 [place], MD 12/26/15 [place], MD 12/26/15 1504    Leo Grosser, MD 12/26/15 [id number]

## 2015-12-26 NOTE — Consult Note (Signed)
PULMONARY / CRITICAL CARE MEDICINE   Name: Joseph Mcdonald MRN: [id number] DOB: [birth year]    ADMISSION DATE:  12/26/2015 CONSULTATION DATE:  12/26/15  REFERRING MD:  Leo Grosser  CHIEF COMPLAINT:  AMS, hypotension  HISTORY OF PRESENT ILLNESS:   Joseph Mcdonald is a 65-y/o male who presents with decreased responsiveness, abdominal pain and hypotension. Patient was in his usual state of health until this morning when he was not feeling well, per pt and his wife. He was able to eat breakfast but felt tired. He went to the [place] with his wife and when she turned around to check on him, he was slumped over in the chair with his eye's rolled back in his head. No rhythmic motions or loss of bowel or bladder incontinence. Wife called 53 and EMS had difficulty getting a blood pressure reading. Patient was hypotensive to 71/61 when presented to ED.   Of note, patient's blood pressure medication was changed yesterday to lisinopril 20 mg daily due to proteinuria, stopping HCTZ 25 mg daily, and continuing hydralazine 50 mg TID and amlodipine 5 mg. Lowest SCr from this year was 2.41. Last BMP from 11/26/15 from [facility] was SCr 3.08, BUN 44. Patient appeared very dehydrated by ED provider's exam and had completely compressible IVC on focused ultrasound; improved with fluids. Was given levophed and 4.5 L NS in ED.   Also complained of back pain (chronic), but given vital signs, AAA ruled out with CT abdomen pelvis. Mild ST changes on EKG, evaluated by Cardiology and cycling troponins recommended. Initial troponin negative.   Had large BMs in bed in ED that improved abdominal pain.   PAST MEDICAL HISTORY :  He  has a past medical history of Anxiety; Depression; Diabetes mellitus without complication ([place]); Hypertension; and Peripheral neuropathy ([place]). CAGB in 2011 and HLD. CKD. Chronic back pain on percocet, started about 1 month ago.   PAST SURGICAL HISTORY: He  has a  past surgical history that includes Coronary artery bypass graft.  Allergies  Allergen Reactions  . Penicillins Hives and Itching    Has patient had a PCN reaction causing immediate rash, facial/tongue/throat swelling, SOB or lightheadedness with hypotension: Yes Has patient had a PCN reaction causing severe rash involving mucus membranes or skin necrosis: Yes Has patient had a PCN reaction that required hospitalization No Has patient had a PCN reaction occurring within the last 10 years: No If all of the above answers are "NO", then may proceed with Cephalosporin use.     No current facility-administered medications on file prior to encounter.    Current Outpatient Prescriptions on File Prior to Encounter  Medication Sig  . allopurinol (ZYLOPRIM) 100 MG tablet Take 100 mg by mouth daily.  Marland Kitchen amLODipine (NORVASC) 5 MG tablet Take 5 mg by mouth 2 (two) times daily.   Marland Kitchen aspirin 81 MG tablet Take 1 tablet (81 mg total) by mouth daily.  . clonazePAM (KLONOPIN) 0.5 MG tablet Take 1 tablet (0.5 mg total) by mouth 2 (two) times daily as needed for anxiety.  Marland Kitchen escitalopram (LEXAPRO) 10 MG tablet Take 1 tablet (10 mg total) by mouth daily.  Marland Kitchen gabapentin (NEURONTIN) 600 MG tablet Take 1 tablet (600 mg total) by mouth 2 (two) times daily.  . hydrALAZINE (APRESOLINE) 50 MG tablet Take 50 mg by mouth 3 (three) times daily.  Marland Kitchen LEVEMIR FLEXTOUCH 100 UNIT/ML Pen Inject 75 Units into the skin at bedtime.  Marland Kitchen lisinopril (PRINIVIL,ZESTRIL) 20 MG tablet Take 1 tablet (  20 mg total) by mouth daily.  Marland Kitchen NOVOLOG FLEXPEN 100 UNIT/ML FlexPen Inject 30 Units into the skin See admin instructions. 3 times daily with meals per sliding scale  . oxyCODONE-acetaminophen (ROXICET) 5-325 MG tablet Take 1 tablet by mouth every 8 (eight) hours as needed for severe pain.  . simvastatin (ZOCOR) 80 MG tablet     FAMILY HISTORY:  His indicated that his father is deceased. He indicated that only one of his two brothers is alive.     SOCIAL HISTORY: He  reports that he has never smoked. He does not have any smokeless tobacco history on file. He reports that he does not drink alcohol.  REVIEW OF SYSTEMS:   No dark stools. No dark urine. No emesis. No fevers or chills. No recent falls. Occasional SOB with exertion.   VITAL SIGNS: BP (!) 89/52   Pulse 67   Temp (!) 93.6 F (34.2 C) (Core (Comment))   Resp 25   SpO2 97%    INTAKE / OUTPUT: No intake/output data recorded.  PHYSICAL EXAMINATION: General: Obese male, alert, interactive bear-hugger, [name] in place HEENT: Tacky mucous membranes.  Cardiovascular: RRR, S1, S2. No m/r/g. DP, PT pulses present.  Lungs: Mild crackles throughout  Abdomen: +BS, soft, obese and distended, no rebound or guarding Musculoskeletal: FROM.  Neuro: AOx3. No focal deficits.  Skin:  Skin tenting of UEs. Petechiae across right foot.   LABS:  BMET  Recent Labs Lab 12/26/15 1019 12/26/15 1022  NA 135 136  K 3.3* 3.2*  CL 106 107  CO2  --  13*  BUN 67* 69*  CREATININE 5.30* 5.10*  GLUCOSE 91 88    Electrolytes  Recent Labs Lab 12/26/15 1022  CALCIUM 7.6*    CBC  Recent Labs Lab 12/26/15 1019 12/26/15 1022  WBC  --  4.3  HGB 10.5* 10.4*  HCT 31.0* 30.4*  PLT  --  293  286    Coag's  Recent Labs Lab 12/26/15 1022  APTT 22*  INR 1.18    Sepsis Markers  Recent Labs Lab 12/26/15 1019  LATICACIDVEN 2.84*    ABG  Recent Labs Lab 12/26/15 1201 12/26/15 1219  PHART 7.207* 7.235*  PCO2ART 32.6 28.0*  PO2ART 33.0* 68.0*    Liver Enzymes  Recent Labs Lab 12/26/15 1022  AST 15  ALT 11*  ALKPHOS 37*  BILITOT 0.6  ALBUMIN 3.3*    Cardiac Enzymes No results for input(s): TROPONINI, PROBNP in the last 168 hours.  Glucose  Recent Labs Lab 12/26/15 1017  GLUCAP 78    Imaging Ct Abdomen Pelvis Wo Contrast  Result Date: 12/26/2015 CLINICAL DATA:  Abdominal bloating, shortness of breath. Concern for sepsis. End-stage renal  disease. EXAM: CT ABDOMEN AND PELVIS WITHOUT CONTRAST TECHNIQUE: Multidetector CT imaging of the abdomen and pelvis was performed following the standard protocol without IV contrast. COMPARISON:  None. FINDINGS: Lower chest: Left lower lobe opacities, likely atelectasis. Linear right base atelectasis. No effusions. Heart is mildly enlarged. Hepatobiliary: No focal hepatic abnormality. Gallbladder unremarkable. Pancreas: No focal abnormality or ductal dilatation. Spleen: No focal abnormality.  Normal size. Adrenals/Urinary Tract: Scattered low-density and isodense areas within the kidneys bilaterally, shown on prior ultrasounds represent cysts. No hydronephrosis. Adrenal glands can't urinary bladder grossly unremarkable. Stomach/Bowel: Moderate stool burden in the rectosigmoid colon. Stomach is mildly distended with fluid/ food. Evidence of bowel obstruction. Vascular/Lymphatic: Aortic and iliac calcifications. No aneurysm. No adenopathy. Reproductive: Prostate mildly prominent. No visible focal abnormality. Other: No free fluid  or free air.  No focal fluid collection. Musculoskeletal: No acute bony abnormality or focal bone lesion. IMPRESSION: Bibasilar opacities, left greater than right, likely atelectasis. Mild cardiomegaly, coronary artery disease, aortoiliac atherosclerosis. Mildly distended stomach.  No evidence of bowel obstruction. No acute findings in the abdomen or pelvis. Electronically Signed   By: Rolm Baptise M.D.   On: 12/26/2015 11:01   Dg Chest Portable 1 View  Result Date: 12/26/2015 CLINICAL DATA:  65 year old male with history of hypotension and abdominal cramping for the past 2 days. EXAM: PORTABLE CHEST 1 VIEW COMPARISON:  Chest x-ray 01/03/2010. FINDINGS: Low lung volumes. No acute consolidative airspace disease. No pleural effusions. Mild cardiomegaly. Cephalization of the pulmonary vasculature with indistinctness of the interstitial markings, suggestive of underlying interstitial  pulmonary edema. Aortic atherosclerosis. Status post median sternotomy for CABG. Transcutaneous defibrillator pads are seen projecting over the lower left hemithorax. IMPRESSION: 1. The appearance the chest suggests mild congestive heart failure. 2. Low lung volumes. 3. Aortic atherosclerosis. Electronically Signed   By: Vinnie Langton M.D.   On: 12/26/2015 11:09    STUDIES:  ECHO pending.   CULTURES: Blood cultures x 2 obtained in ED.  ASSESSMENT / PLAN:  Hypotension: Suspect secondary to dehydration. Had diarrhea upon arrival to ED however not before. Improved with fluid resucitation. Pt denies change in oral intake but has had nausea. Also, took 2 doses of lisinopril less than 12 hours apart. Highly doubt systemic infection. No history to support and no clear source. Will check procalcitonin for completeness. Continue to follow blood cultures.  Acute on Chronic Renal Failure: Suspect pre-renal in etiology or intrinsic with possible ATN. Continue fluid resuscitation (~100 cc/hr). Lactic acidosis resolved.   Cardiovascular: CXR with vascular congestion and enlarged cardiac silhouette. ECHO pending to assess for CHF.   GI/Diarrhea: Appears to be in setting of constipation. Stool ball seen on CT abdomen pelvis prior to BM in ED. Occasionally takes stool softeners. Home medication includes percocet.   Chronic Back Pain: Stable. Gave 1 dose of IV fentanyl 25 mcg in ED.   Appears stable for non-ICU admission.   FAMILY  - Updates: at bedside  Olene Floss, MD [place], PGY-2 12/26/2015, 1:47 PM  Attending Note:  I have examined patient, reviewed labs, studies and notes. I have discussed the case with Dr Ola Spurr, and I agree with the data and plans as amended above. Pt is 40 with hx EtOH use, HTN, DM, CAD, renal insuff. He reports that he was well until this am when he developed lethargy, abd pain, fatigue. He developed syncope in front of his wife, short  duration, no evidence seizure. On EMS eval he was hypotensive. Of note he was just started on lisinopril yesterday, took this am along with his norvasc and hydralazine. He received aggressive IVF resuscitation in the ED. Lactate was 2.84. Suspect that his refractory hypotension was due to meds and a component dehydration. He is improved on my evaluation in the ED - BP normalized, normal MS and heart exam. Normal telemetry. I believe he can be admitted for monitoring. We will hold off on abx, doubt sepsis at this juncture. Please call us if we can help further.   Baltazar Apo, MD, PhD 12/26/2015, 3:55 PM [facility] [phone] or if no answer [phone]

## 2015-12-26 NOTE — Progress Notes (Signed)
Pt arrived to rm 2w26 on strecher with CNA at bedside. Pt a&o x4 not able to move to bed. Pt pulled over via nurse and cna. Pt placed on tele and ccmd notified and 2nd person verified. Pt on room air. V/S obtained via cna. See flow sheet. Pt resting in bed. Dr. Ree Kida pt here. Cato Mulligan RN

## 2015-12-26 NOTE — ED Notes (Signed)
Pt is awake and alert-- talking with MD-- pt sitting up

## 2015-12-26 NOTE — ED Notes (Signed)
Returned from CT scan.

## 2015-12-26 NOTE — H&P (Signed)
[facility] Admission History and Physical Service Pager: [phone]  Patient name: Joseph Mcdonald Medical record number: [id number] Date of birth: October 05, 1950 Age: 65 y.o. Gender: male  Primary Care Provider: Mercy Riding, MD Consultants: Critical care  [place] Status: Full  Chief Complaint: altered mental status, hypotension  Assessment and Plan: Joseph Mcdonald is a 65 y.o. male presenting with altered mental status, hypotension. PMH is significant for alcohol abuse, CAD status post CABG in 2011 and hyperlipidemia, hypertension, type 2 diabetes, chronic kidney disease, chronic back pain, anxiety, depression  Hypotension:  Etiology unclear, although could be combination of dehydration as patient only drinking >2L soda per day, and anti-hypertensives, as lisinopril added to his regimen today. Currently hemodynamically stable status post 6 L and levophed. - Admitted to family practice teaching service under attending Dr. Ree Kida - D5 half-normal saline at 125 mL/h -  Placed on telemetry -  Vitals per floor policy -  Holding blood pressure medications for now  Abdominal pain:  Improving from admission. Negative FAST exam. Negative CT abdomen.  Pain relief after having bowel movement.  Reported constipation for several days prior to admission.   - dulcolax suppository - Continue to monitor. Consider repeat imaging if worsening or no improvement after BM  Type 2 diabetes:  Patient on levemir 75U at bedtime and novolog 30units 3x daily with meals.   - 35U levemir and SSI - qACHS CBGs  Chronic back pain:   - continue oxycodone-acetaminophen 5-325mg  PRN - continue gabapentin 600mg  BID  Anxiety/depression: Stable - continue lexapro 10mg  daily - holding klonopin 0.5mg  PRN  Hypertension:  Home hydralazine 50 mg TID, amlodipine 5 mg BID and lisinopril 20 mg daily. BP currently 130/61.  - holding BP medications given hypotension on  presentation  FEN/GI: soft diet, D5 1/2 NS @125cc /hr Prophylaxis: subQ heparin  Disposition: Pending medical improvement  History of Present Illness:  Joseph Mcdonald is a 65 y.o. male presenting with decreased responsiveness, abdominal pain and hypotension.    Patient woke up in his usual state of health this AM. He ate breakfast then went to the [place], where he began to feel unwell. Says he was not dizzy at this time, but does not remember much after arriving at the [facility]. Patient was found by his wife to be slumped over in a chair at that time with his eyes reportedly rolled back in his head. Patient's wife called 52, and he was subsequently transported to ED. No seizure activity noted. Patient was pale and diaphoretic upon EMS arrival, and BP was unable to be obtained at scene or in transit.   He was hypotensive to 71/61 when presenting to the ED and was noted to be diaphoretic and pale and complaining of abdominal pain. FAST exam revealed a collapsed IVC. The patient was given 6 L of fluids and started on levophed. Blood pressure subsequently improved.  He had a negative CT abdomen and he had 2 large bowel movements which improved his abdominal pain at that time.  Currently he denies any nausea, vomiting, diarrhea, chest pain or shortness of breath. Is again endorsing abdominal pain.   Saw PCP yesterday and was prescribed lisinopril for uncontrolled HTN, which he took for the first time today.  Of note patient reportedly hasn't had a bowel movement in 3-4 days prior to ED presentation.  He also reports drinking more than 2L Diet Pepsi daily, and not drinking water. Denies working outside in heat or any unusual activity recently.  Review Of Systems: Per history of present illness  ROS  Patient Active Problem List   Diagnosis Date Noted  . Hypovolemic shock ([place]) 12/26/2015  . Abnormal ECG 12/26/2015  . Acute renal failure (ARF) ([place]) 12/26/2015  . Hypotension  12/26/2015  . Secondary hyperparathyroidism of renal origin ([place]) 12/19/2015  . Anemia of chronic disease 12/19/2015  . Chronic back pain 11/20/2015  . Depression 10/20/2015  . Generalized anxiety disorder 10/20/2015  . Alcohol withdrawal ([place]) 10/11/2015  . Type 2 diabetes mellitus ([place]) 10/11/2015  . CKD (chronic kidney disease) stage 3, GFR 30-59 ml/min 10/11/2015  . Hyperlipidemia 01/18/2010  . Essential hypertension 01/18/2010  . Diabetic neuropathy ([place]) 01/17/2010  . CAD 01/17/2010    Past Medical History: Past Medical History:  Diagnosis Date  . Anxiety   . Depression   . Diabetes mellitus without complication ([place])   . Hypertension   . Peripheral neuropathy [facility])     Past Surgical History: Past Surgical History:  Procedure Laterality Date  . CORONARY ARTERY BYPASS GRAFT      Social History: Social History  Substance Use Topics  . Smoking status: Never Smoker  . Smokeless tobacco: Not on file  . Alcohol use No     Family History: Family History  Problem Relation Age of Onset  . Heart attack Father   . Heart attack Brother   . Heart attack Brother     Allergies and Medications: Allergies  Allergen Reactions  . Penicillins Hives and Itching    Has patient had a PCN reaction causing immediate rash, facial/tongue/throat swelling, SOB or lightheadedness with hypotension: Yes Has patient had a PCN reaction causing severe rash involving mucus membranes or skin necrosis: Yes Has patient had a PCN reaction that required hospitalization No Has patient had a PCN reaction occurring within the last 10 years: No If all of the above answers are "NO", then may proceed with Cephalosporin use.    No current facility-administered medications on file prior to encounter.    Current Outpatient Prescriptions on File Prior to Encounter  Medication Sig Dispense Refill  . allopurinol (ZYLOPRIM) 100 MG tablet Take 100 mg by mouth daily.    Marland Kitchen amLODipine (NORVASC) 5 MG  tablet Take 5 mg by mouth 2 (two) times daily.     Marland Kitchen aspirin 81 MG tablet Take 1 tablet (81 mg total) by mouth daily. 30 tablet   . clonazePAM (KLONOPIN) 0.5 MG tablet Take 1 tablet (0.5 mg total) by mouth 2 (two) times daily as needed for anxiety. 20 tablet 0  . escitalopram (LEXAPRO) 10 MG tablet Take 1 tablet (10 mg total) by mouth daily. 30 tablet 1  . gabapentin (NEURONTIN) 600 MG tablet Take 1 tablet (600 mg total) by mouth 2 (two) times daily. 60 tablet 0  . hydrALAZINE (APRESOLINE) 50 MG tablet Take 50 mg by mouth 3 (three) times daily.    Marland Kitchen LEVEMIR FLEXTOUCH 100 UNIT/ML Pen Inject 75 Units into the skin at bedtime.    Marland Kitchen lisinopril (PRINIVIL,ZESTRIL) 20 MG tablet Take 1 tablet (20 mg total) by mouth daily. 90 tablet 3  . NOVOLOG FLEXPEN 100 UNIT/ML FlexPen Inject 30 Units into the skin See admin instructions. 3 times daily with meals per sliding scale    . oxyCODONE-acetaminophen (ROXICET) 5-325 MG tablet Take 1 tablet by mouth every 8 (eight) hours as needed for severe pain. 90 tablet 0  . simvastatin (ZOCOR) 80 MG tablet       Objective: BP 130/61 (BP Location:  Left Arm)   Pulse 66   Temp 98.6 F (37 C) (Oral)   Resp 18   Ht 5\' 4"  (1.626 m)   Wt 177 lb 8 oz (80.5 kg)   SpO2 96%   BMI 30.47 kg/m  Exam: General: 65yo obese M with bear hugger and [place] in place. Appears comfortable, but weak.  Eyes: PERRLA, EOMI, non-injected Neck: supple, normal ROM Cardiovascular: RRR, s1/s2 present with no murmurs Respiratory: normal work of breathing, CTAB, no wheezing or rhonchi Gastrointestinal: soft, moderately distended, mild RLQ tenderness without guarding or rebounding MSK: no clubbing, cyanosis; 2+ edema to midshin, 1+ to knee bilaterally Derm: warm and dry Neuro: AAOx3 Psych: normal mood and affect  Labs and Imaging: CBC BMET   Recent Labs Lab 12/26/15 1022  WBC 4.3  HGB 10.4*  HCT 30.4*  PLT 293  286    Recent Labs Lab 12/26/15 1022  NA 136  K 3.2*  CL 107  CO2  13*  BUN 69*  CREATININE 5.10*  GLUCOSE 88  CALCIUM 7.6*      Shayleen Eppinger Delman Cheadle, MD 12/26/2015, 8:51 PM PGY-1, [place] Intern pager: [phone], text pages welcome  UPPER LEVEL ADDENDUM  I have read the above note and made revisions highlighted in orange.  Adin Hector, MD, MPH PGY-2 [facility] Pager [phone]

## 2015-12-26 NOTE — Progress Notes (Signed)
Pt wife Jazmine Zech called at [phone]. Requested information about blood pressure given. Wife informed that pt is sleeping at the time.

## 2015-12-26 NOTE — Progress Notes (Signed)
Pt wife called to get update on pt condition. Information given about pt VS being stable. Wife concerned about pain meds being available to pt. He is taking pain meds for chronic back pain. MD paged. Pt spoke with wife.

## 2015-12-26 NOTE — ED Notes (Signed)
Pt being taken upstairs by Markus Daft, EMT

## 2015-12-27 DIAGNOSIS — Z8249 Family history of ischemic heart disease and other diseases of the circulatory system: Secondary | ICD-10-CM | POA: Diagnosis not present

## 2015-12-27 DIAGNOSIS — R571 Hypovolemic shock: Secondary | ICD-10-CM | POA: Diagnosis present

## 2015-12-27 DIAGNOSIS — R9431 Abnormal electrocardiogram [ECG] [EKG]: Secondary | ICD-10-CM | POA: Diagnosis not present

## 2015-12-27 DIAGNOSIS — I2510 Atherosclerotic heart disease of native coronary artery without angina pectoris: Secondary | ICD-10-CM | POA: Diagnosis present

## 2015-12-27 DIAGNOSIS — E669 Obesity, unspecified: Secondary | ICD-10-CM | POA: Diagnosis present

## 2015-12-27 DIAGNOSIS — J189 Pneumonia, unspecified organism: Secondary | ICD-10-CM | POA: Diagnosis present

## 2015-12-27 DIAGNOSIS — Z6830 Body mass index (BMI) 30.0-30.9, adult: Secondary | ICD-10-CM | POA: Diagnosis not present

## 2015-12-27 DIAGNOSIS — Z79899 Other long term (current) drug therapy: Secondary | ICD-10-CM | POA: Diagnosis not present

## 2015-12-27 DIAGNOSIS — A419 Sepsis, unspecified organism: Secondary | ICD-10-CM | POA: Diagnosis present

## 2015-12-27 DIAGNOSIS — I25709 Atherosclerosis of coronary artery bypass graft(s), unspecified, with unspecified angina pectoris: Secondary | ICD-10-CM | POA: Diagnosis not present

## 2015-12-27 DIAGNOSIS — E1142 Type 2 diabetes mellitus with diabetic polyneuropathy: Secondary | ICD-10-CM | POA: Diagnosis present

## 2015-12-27 DIAGNOSIS — E785 Hyperlipidemia, unspecified: Secondary | ICD-10-CM | POA: Diagnosis present

## 2015-12-27 DIAGNOSIS — Z951 Presence of aortocoronary bypass graft: Secondary | ICD-10-CM | POA: Diagnosis not present

## 2015-12-27 DIAGNOSIS — I959 Hypotension, unspecified: Secondary | ICD-10-CM | POA: Diagnosis present

## 2015-12-27 DIAGNOSIS — N184 Chronic kidney disease, stage 4 (severe): Secondary | ICD-10-CM | POA: Diagnosis present

## 2015-12-27 DIAGNOSIS — F411 Generalized anxiety disorder: Secondary | ICD-10-CM | POA: Diagnosis present

## 2015-12-27 DIAGNOSIS — I214 Non-ST elevation (NSTEMI) myocardial infarction: Secondary | ICD-10-CM | POA: Diagnosis present

## 2015-12-27 DIAGNOSIS — R55 Syncope and collapse: Secondary | ICD-10-CM | POA: Diagnosis not present

## 2015-12-27 DIAGNOSIS — I129 Hypertensive chronic kidney disease with stage 1 through stage 4 chronic kidney disease, or unspecified chronic kidney disease: Secondary | ICD-10-CM | POA: Diagnosis present

## 2015-12-27 DIAGNOSIS — E1122 Type 2 diabetes mellitus with diabetic chronic kidney disease: Secondary | ICD-10-CM | POA: Diagnosis present

## 2015-12-27 DIAGNOSIS — K5900 Constipation, unspecified: Secondary | ICD-10-CM | POA: Diagnosis present

## 2015-12-27 DIAGNOSIS — Y95 Nosocomial condition: Secondary | ICD-10-CM | POA: Diagnosis present

## 2015-12-27 DIAGNOSIS — T464X5A Adverse effect of angiotensin-converting-enzyme inhibitors, initial encounter: Secondary | ICD-10-CM | POA: Diagnosis present

## 2015-12-27 DIAGNOSIS — N183 Chronic kidney disease, stage 3 (moderate): Secondary | ICD-10-CM

## 2015-12-27 DIAGNOSIS — I472 Ventricular tachycardia: Secondary | ICD-10-CM | POA: Diagnosis present

## 2015-12-27 DIAGNOSIS — N179 Acute kidney failure, unspecified: Secondary | ICD-10-CM | POA: Diagnosis present

## 2015-12-27 DIAGNOSIS — F329 Major depressive disorder, single episode, unspecified: Secondary | ICD-10-CM | POA: Diagnosis present

## 2015-12-27 DIAGNOSIS — D638 Anemia in other chronic diseases classified elsewhere: Secondary | ICD-10-CM | POA: Diagnosis not present

## 2015-12-27 DIAGNOSIS — E872 Acidosis: Secondary | ICD-10-CM | POA: Diagnosis present

## 2015-12-27 DIAGNOSIS — J9600 Acute respiratory failure, unspecified whether with hypoxia or hypercapnia: Secondary | ICD-10-CM | POA: Diagnosis present

## 2015-12-27 DIAGNOSIS — R6510 Systemic inflammatory response syndrome (SIRS) of non-infectious origin without acute organ dysfunction: Secondary | ICD-10-CM | POA: Diagnosis not present

## 2015-12-27 DIAGNOSIS — G8929 Other chronic pain: Secondary | ICD-10-CM | POA: Diagnosis present

## 2015-12-27 LAB — GLUCOSE, CAPILLARY
GLUCOSE-CAPILLARY: 179 mg/dL — AB (ref 65–99)
GLUCOSE-CAPILLARY: 167 mg/dL — AB (ref 65–99)
Glucose-Capillary: 200 mg/dL — ABNORMAL HIGH (ref 65–99)
Glucose-Capillary: 218 mg/dL — ABNORMAL HIGH (ref 65–99)

## 2015-12-27 LAB — COMPREHENSIVE METABOLIC PANEL
ALBUMIN: 2.6 g/dL — AB (ref 3.5–5.0)
ALK PHOS: 32 U/L — AB (ref 38–126)
ALT: 13 U/L — AB (ref 17–63)
AST: 15 U/L (ref 15–41)
Anion gap: 8 (ref 5–15)
BUN: 55 mg/dL — AB (ref 6–20)
CALCIUM: 7.1 mg/dL — AB (ref 8.9–10.3)
CO2: 17 mmol/L — AB (ref 22–32)
CREATININE: 3.92 mg/dL — AB (ref 0.61–1.24)
Chloride: 110 mmol/L (ref 101–111)
GFR calc non Af Amer: 15 mL/min — ABNORMAL LOW (ref >60)
GFR, EST AFRICAN AMERICAN: 17 mL/min — AB (ref >60)
GLUCOSE: 228 mg/dL — AB (ref 65–99)
Potassium: 4.4 mmol/L (ref 3.5–5.1)
SODIUM: 135 mmol/L (ref 135–145)
Total Bilirubin: 0.3 mg/dL (ref 0.3–1.2)
Total Protein: 4.8 g/dL — ABNORMAL LOW (ref 6.5–8.1)

## 2015-12-27 LAB — POCT I-STAT 3, ART BLOOD GAS (G3+)
Acid-base deficit: 14 mmol/L — ABNORMAL HIGH (ref 0.0–2.0)
BICARBONATE: 13.4 mmol/L — AB (ref 20.0–28.0)
O2 Saturation: 63 %
PO2 ART: 33 mmHg — AB (ref 83.0–108.0)
TCO2: 15 mmol/L (ref 0–100)
pCO2 arterial: 32.6 mmHg (ref 32.0–48.0)
pH, Arterial: 7.207 — ABNORMAL LOW (ref 7.350–7.450)

## 2015-12-27 LAB — URINE CULTURE: Culture: <10000 — AB

## 2015-12-27 LAB — TROPONIN I
Troponin I: 0.4 ng/mL (ref <0.03)
Troponin I: 0.31 ng/mL (ref <0.03)
Troponin I: 0.26 ng/mL (ref <0.03)

## 2015-12-27 LAB — CBC
HCT: 27.1 % — ABNORMAL LOW (ref 39.0–52.0)
Hemoglobin: 9.1 g/dL — ABNORMAL LOW (ref 13.0–17.0)
MCH: 30.6 pg (ref 26.0–34.0)
MCHC: 33.6 g/dL (ref 30.0–36.0)
MCV: 91.2 fL (ref 78.0–100.0)
PLATELETS: 198 10*3/uL (ref 150–400)
RBC: 2.97 MIL/uL — ABNORMAL LOW (ref 4.22–5.81)
RDW: 13.2 % (ref 11.5–15.5)
WBC: 9.1 10*3/uL (ref 4.0–10.5)

## 2015-12-27 MED ORDER — DOCUSATE SODIUM 100 MG PO CAPS
100.0000 mg | ORAL_CAPSULE | Freq: Two times a day (BID) | ORAL | Status: DC | PRN
  Administered 2015-12-27 – 2015-12-28 (×2): 100 mg via ORAL
  Filled 2015-12-27 (×2): qty 1

## 2015-12-27 MED ORDER — SODIUM CHLORIDE 0.9 % IV SOLN
INTRAVENOUS | Status: DC
  Administered 2015-12-27 – 2015-12-28 (×2): via INTRAVENOUS

## 2015-12-27 MED ORDER — AMLODIPINE BESYLATE 5 MG PO TABS
5.0000 mg | ORAL_TABLET | Freq: Two times a day (BID) | ORAL | Status: DC
  Administered 2015-12-27: 5 mg via ORAL
  Filled 2015-12-27 (×2): qty 1

## 2015-12-27 MED ORDER — ACETAMINOPHEN 325 MG PO TABS
650.0000 mg | ORAL_TABLET | Freq: Four times a day (QID) | ORAL | Status: DC | PRN
  Administered 2015-12-27 – 2015-12-28 (×3): 650 mg via ORAL
  Filled 2015-12-27 (×3): qty 2

## 2015-12-27 NOTE — Progress Notes (Signed)
Patient Name: Joseph Mcdonald Date of Encounter: 12/27/2015  Hospital Problem List     Principal Problem:   Hypovolemic shock [facility]) Active Problems:   Type 2 diabetes mellitus ([place])   Abnormal ECG   Acute renal failure (ARF) (HCC)   Hypotension    Subjective   Feeling well this morning. Sitting up eating breakfast.   Inpatient Medications    . allopurinol  100 mg Oral Daily  . aspirin EC  81 mg Oral Daily  . atorvastatin  40 mg Oral q1800  . calcium acetate  667 mg Oral TID WC  . escitalopram  10 mg Oral Daily  . gabapentin  600 mg Oral BID  . heparin  5,000 Units Subcutaneous Q8H  . insulin aspart  0-15 Units Subcutaneous TID WC  . insulin aspart  0-5 Units Subcutaneous QHS  . insulin aspart  4 Units Subcutaneous TID WC  . insulin detemir  35 Units Subcutaneous QHS    Vital Signs    Vitals:   12/26/15 1843 12/26/15 2223 12/27/15 0330 12/27/15 0600  BP: 130/61 (!) 149/62 (!) 150/60 (!) 135/58  Pulse: 66 (!) 59 (!) 54 (!) 54  Resp: 18 20 18 18   Temp: 98.6 F (37 C) 98.6 F (37 C) 98.7 F (37.1 C) 98.5 F (36.9 C)  TempSrc: Oral Oral Oral Oral  SpO2: 96% 100% 98% 100%  Weight:      Height:        Intake/Output Summary (Last 24 hours) at 12/27/15 0920 Last data filed at 12/27/15 0600  Gross per 24 hour  Intake          9789.17 ml  Output             4100 ml  Net          5689.17 ml   Filed Weights   12/26/15 1800  Weight: 177 lb 8 oz (80.5 kg)    Physical Exam    General: Pleasant caucasian male, NAD. Neuro: Alert and oriented X 3. Moves all extremities spontaneously. Psych: Normal affect. HEENT:  Normal  Neck: Supple without bruits or JVD. Lungs:  Resp regular and unlabored, CTA. Heart: RRR no s3, s4, or murmurs. Abdomen: Soft, non-tender, non-distended, BS + x 4.  Extremities: No clubbing, cyanosis or edema. DP/PT/Radials 2+ and equal bilaterally.  Labs    CBC  Recent Labs  12/26/15 1022 12/27/15 0850  WBC 4.3 9.1    NEUTROABS 1.3*  --   HGB 10.4* 9.1*  HCT 30.4* 27.1*  MCV 90.2 91.2  PLT 293  286 [id number]   Basic Metabolic Panel  Recent Labs  12/26/15 1022 12/26/15 1146 12/27/15 0548  NA 136  --  135  K 3.2*  --  4.4  CL 107  --  110  CO2 13*  --  17*  GLUCOSE 88  --  228*  BUN 69*  --  55*  CREATININE 5.10*  --  3.92*  CALCIUM 7.6*  --  7.1*  MG  --  1.7  --    Liver Function Tests  Recent Labs  12/26/15 1022 12/27/15 0548  AST 15 15  ALT 11* 13*  ALKPHOS 37* 32*  BILITOT 0.6 0.3  PROT 5.5* 4.8*  ALBUMIN 3.3* 2.6*   No results for input(s): LIPASE, AMYLASE in the last 72 hours. Cardiac Enzymes  Recent Labs  12/26/15 2249  CKTOTAL 94   BNP Invalid input(s): POCBNP D-Dimer  Recent Labs  12/26/15 1022  DDIMER  1.94*   Hemoglobin A1C No results for input(s): HGBA1C in the last 72 hours. Fasting Lipid Panel  Recent Labs  12/25/15 1429  CHOL 180  HDL 48  LDLCALC 81  TRIG 255*  CHOLHDL 3.8   Thyroid Function Tests  Recent Labs  12/26/15 2039  TSH 1.544    Telemetry    SR, 1st degree AVB, 5 beat NSVT asymptomatic.   ECG    10/3 SR with inferiolateral ST depression.   Radiology    TTE: 12/26/15  Study Conclusions  - Left ventricle: The cavity size was mildly dilated. There was   moderate concentric hypertrophy. Systolic function was normal.   The estimated ejection fraction was in the range of 55% to 60%.   Wall motion was normal; there were no regional wall motion   abnormalities. Features are consistent with a pseudonormal left   ventricular filling pattern, with concomitant abnormal relaxation   and increased filling pressure (grade 2 diastolic dysfunction). - Aortic valve: Trileaflet; mildly thickened, mildly calcified   leaflets. Valve area (VTI): 1.83 cm^2. Valve area (Vmax): 1.84   cm^2. Valve area (Vmean): 1.86 cm^2. - Left atrium: The atrium was moderately dilated.  Assessment & Plan    65 y.o. 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 CKD IV, HTN, DM, obesity, depression who presented to the [facility] ED after taking his morning medications and having a syncopal episode at the [facility].   1. Shock ([place]): Received 6L of IV fluids yesterday and breifly required the addi tion of levophed. Appears to have now resolved, BP stable this morning. Question if related to the new addition of lisinopril yesterday.    2. Acute renal failure (ARF) ([place]): Has a known Hx of renal failure, reports being followed by a nephrologist in [place]. Cr improved today from 5.30>>3.92.   3. Type 2 diabetes mellitus ([place]): SSI had one low readings yesterday   4. Abnormal ECG: mild ST changes initially, worsened a short time later yesterday. Denies any chest pain, initial Trop negative. Echo showed normal EF with no WMA. Telemetry showed 1st degree AVB with fewer PVCs this morning. One 5 beat run of NSVT which was not symptomatic. Does not appear that enzymes were cycled, will order today. -- Will repeat EKG today   5. Hypokalemia: Resolved  Signed, Joseph Bellis NP-C Pager [phone] Patient seen and examined and history reviewed. Agree with above findings and plan. Looks and feels much better today. No chest pain. Hypotension has resolved. Ecg is back to baseline. Labs on admission with normal CK and troponin. Echo with normal LV function. I suspect this is iatrogenic with addition of ACEi in a patient with already compromised renal function. Would avoid ACEi/ARB/Diuretics in future. We will sign off now. Will arrange follow up with Dr. Harrington Challenger.   Joseph Mcdonald, [place] 12/27/2015 10:52 AM

## 2015-12-27 NOTE — Progress Notes (Signed)
Pt had 5 beats run of non sustained Vtach. VS stables, bradycardia 54. Pt Alert and oriented, denied chest pain. MD notified at [phone]. At [place] doctor called back, instruction given to continue to monitor pt.

## 2015-12-27 NOTE — Progress Notes (Signed)
CRITICAL VALUE ALERT  Critical value received:  Troponin 0.40  Date of notification: 12/27/15  Time of notification: 11:38  Critical value read back: yes  Nurse who received alert:  Alex Gardener  MD notified (1st page):  Teaching Service intern pager  Time of first page:  11:39  MD notified (2nd page): Teaching Service upper level resident  Time of second page: 12:00  Responding MD: Teaching Service upper level resident  Time MD responded: 12:15

## 2015-12-27 NOTE — Discharge Summary (Signed)
[facility] Discharge Summary  Patient name: Joseph Mcdonald Medical record number: [id number] Date of birth: [birth year] Age: 65 y.o. Gender: male Date of Admission: 12/26/2015  Date of Discharge: 01/02/16 Admitting Physician: Lupita Dawn, MD  Primary Care Provider: Mercy Riding, MD Consultants: Critical Care  Indication for Hospitalization: Hypotension and Altered mental status  Discharge Diagnoses/Problem List:  Patient Active Problem List   Diagnosis Date Noted  . NSTEMI (non-ST elevated myocardial infarction) ([place])   . Pneumonia due to infectious organism   . ST segment depression   . History of ETOH abuse 12/29/2015  . SIRS (systemic inflammatory response syndrome) ([place]) 12/28/2015  . Syncope and collapse   . Chronic kidney disease, stage 4 (severe) ([place])   . Hypovolemic shock ([place]) 12/26/2015  . Abnormal ECG 12/26/2015  . Secondary hyperparathyroidism of renal origin ([place]) 12/19/2015  . Anemia of chronic disease 12/19/2015  . Chronic back pain 11/20/2015  . Depression 10/20/2015  . Generalized anxiety disorder 10/20/2015  . Alcohol withdrawal ([place]) 10/11/2015  . Type 2 diabetes mellitus ([place]) 10/11/2015  . CKD (chronic kidney disease) stage 3, GFR 30-59 ml/min 10/11/2015  . Hyperlipidemia 01/18/2010  . Essential hypertension 01/18/2010  . Diabetic neuropathy ([place]) 01/17/2010  . Coronary atherosclerosis 01/17/2010    Disposition: Home  Discharge Condition: Stable  Discharge Exam:  General: pleasant, in no acute distress   Lungs: clear to auscultation. Normal work of breathing. No rales no wheezes or rhonchi  Heart: Regular rate and rhythm. Normal S1, S2. No murmurs rubs or gallops.  Abdomen: non tender, non distended, normal bowel sounds. No masses. No hepatomegaly.  Extremities: No lower extremity edema.  Neuro:Alert and Oriented x4.  Psych: Normal thought content.   Brief Hospital Course:  Joseph Shults  McIntoshis a 65 y.o.malewho presented with altered mental status and hypotension. PMH is significant for alcohol abuse, CAD status post CABG in 2011 and hyperlipidemia, hypertension, type 2 diabetes, chronic kidney disease, chronic back pain, anxiety, and depression.   Hypovolemic shock/Sepsis secondary to pneumonia Patient arrived to the ED after a syncopal event on 12/26/15 with hypotension to 71/61, altered mental status, and lactic acidosis consistent with hypotensive shock. Patient was immediately given 6 L IV Fluids in the ED which were administered by pressure bag device since patient's IVC was completely flat. He was also given levophed. Home blood pressure meds were held. Patient became normotensive and mental status improved after fluid resuscitation. Critical Care was consulted-suspected hypotension secondary to dehydration. Lactic acid trended down. Exact etiology remained unclear although leading theory was that he took a new medication, lisinopril, on top of being dehydrated from only drinking soda at home. Patient developed a fever on the 2nd day of admission as well as some chest congestion and cough. CXR was performed and revealed pneumonia in the left lower lobe. It was suspected patient may have aspirated during his syncopal event prior to admission or he had HAP. Blood cultures were obtained. He was given broad spectrum antibiotic coverage with Vanc and Cefepime. He was transitioned to Flagyl and Levaquin prior to discharge. Home amlodipine 5 mg BIDwas resumed and home hydralazine 50 mg TID and lisinopril 20 mg daily were held due to previous hypotension.  NSTEMI with elevated Troponin:  Initially,Troponin 0.25 from 0.4 on admission with ST depression on EKG. Cardiology saw patient in the ED and thought this was likely due to global ischemia. Patient had episode of SOB and bradycardia on night of 10/6, troponin was collected  and notable at 5.05, he was transferred to SDU. Heparin ggt was  started. Troponins trended down throughout hospitalization and cardiology continued to follow. It was thought that the elevated troponins were from demand ischemia and acute respiratory failure. Heparin gtt was stopped due to decreasing hemoglobin. A stress test was performed on 10/9 and was negative and echocardiogram was normal. Cardiology recommended to restart home aspirin and consider adding Plavix. Patient was instructed to follow up with Dr. Harrington Challenger in the outpatient setting  Anemia Hgb was low to 7.8 on 10/6 and (possibly from low due to dilutional effect from receiving so much fluids vs anemia of chronic disease vs acute unknown blood loss). He received 1 u pRBCs on 10/7, hemoglobin stablized to 9.0.  Normocytic Anemia likely multifactorial (chronic disease, possible acute blood loss  Abdominal Pain Patient had abdominal pain on admission, imaging was negative for any pathology and it resolved with a large bowel movement.   Acute on Chronic CKD:  Suspected prerenal etiology in the setting of hypotension and volume depletion. Creatinine improved to baseline with fluids.   Type 2 diabetes: Insulin regimen was tailored as needed based on glucose reading. Upon discharge, patient's insulin regimen was Levemir 38 units qhs and Novolog to 5 units TID.   All other chronic medical conditions stable throughout admission and managed with home regimens.  Issues for Follow Up:  1. Pneumonia antibiotics; patient should taken 1 more dose of Levaquin after being discharged 2. Needs FOBT, iron studies, b12, folate to evaluate for anemia. Consider colonoscopy if patient is not up to date 3. Needs to follow up with cardiologist Dr. Harrington Challenger in outpatient  4. HTN regimen: Did not resume Lisinopril and Hydralazine on discharge do hypovelmix shock episode after all BP medications were taken at once. Please evaluate in outpatient setting 5. Diabetes: Discharged on 38 units of Levemir qhs as well as 5 units of  Novolog with meals, this is different from his previous home dose. Will need to keep track of CBGS  Significant Procedures: stress test  Significant Labs and Imaging:  No results for input(s): WBC, HGB, HCT, PLT in the last 168 hours. No results for input(s): NA, K, CL, CO2, GLUCOSE, BUN, CREATININE, CALCIUM, MG, PHOS, ALKPHOS, AST, ALT, ALBUMIN, PROTEIN in the last 168 hours.  Invalid input(s): TBILI No results found.  Results/Tests Pending at Time of Discharge: None  Discharge Medications:    Medication List    STOP taking these medications   hydrALAZINE 50 MG tablet Commonly known as:  APRESOLINE   LEVEMIR FLEXTOUCH 100 UNIT/ML Pen Generic drug:  Insulin Detemir Replaced by:  insulin detemir 100 UNIT/ML injection   lisinopril 20 MG tablet Commonly known as:  PRINIVIL,ZESTRIL   NOVOLOG FLEXPEN 100 UNIT/ML FlexPen Generic drug:  insulin aspart Replaced by:  insulin aspart 100 UNIT/ML injection     TAKE these medications   allopurinol 100 MG tablet Commonly known as:  ZYLOPRIM Take 100 mg by mouth daily.   amLODipine 5 MG tablet Commonly known as:  NORVASC Take 5 mg by mouth 2 (two) times daily.   aspirin 81 MG tablet Take 1 tablet (81 mg total) by mouth daily.   calcium acetate 667 MG capsule Commonly known as:  PHOSLO Take 667 mg by mouth 3 (three) times daily with meals.   insulin aspart 100 UNIT/ML injection Commonly known as:  novoLOG Inject 5 Units into the skin 3 (three) times daily with meals. Replaces:  NOVOLOG FLEXPEN 100 UNIT/ML FlexPen  insulin detemir 100 UNIT/ML injection Commonly known as:  LEVEMIR Inject 0.38 mLs (38 Units total) into the skin at bedtime. Replaces:  LEVEMIR FLEXTOUCH 100 UNIT/ML Pen   oxyCODONE-acetaminophen 5-325 MG tablet Commonly known as:  ROXICET Take 1 tablet by mouth every 8 (eight) hours as needed for severe pain.   polyethylene glycol packet Commonly known as:  MIRALAX / GLYCOLAX Take 17 g by mouth 2 (two)  times daily.   simvastatin 80 MG tablet Commonly known as:  ZOCOR       Discharge Instructions: Please refer to Patient Instructions section of EMR for full details.  Patient was counseled important signs and symptoms that should prompt return to medical care, changes in medications, dietary instructions, activity restrictions, and follow up appointments.   Follow-Up Appointments: Follow-up Information    Mercy Riding, MD Follow up on 01/05/2016.   Specialty:  Family Medicine Why:  Please arrive at 8:15 AM for hospital follow up visit Contact information: [place] [address] [phone]           Carlyle Dolly, MD 01/11/2016, 8:58 PM [place]

## 2015-12-27 NOTE — Progress Notes (Signed)
FPTS Interim Progress Note  Discussed elevated troponin with [name], NP-C, who saw Joseph Mcdonald with Dr [place] today, as it appears that it may have not been seen prior to signing off.  She acknowledges elevated troponin, recommends continuing to trend and will continue to follow as well.  Appreciate assistance with this patient.  Cardiac Panel (last 3 results)  Recent Labs  12/26/15 2249 12/27/15 1024  CKTOTAL 94  --   TROPONINI  --  0.40*     Joseph Norlander, DO 12/27/2015, 2:59 PM PGY-3, [facility] pager [phone]

## 2015-12-27 NOTE — Progress Notes (Signed)
Inpatient Diabetes Program Recommendations  AACE/ADA: New Consensus Statement on Inpatient Glycemic Control (2015)  Target Ranges:  Prepandial:   less than 140 mg/dL      Peak postprandial:   less than 180 mg/dL (1-2 hours)      Critically ill patients:  140 - 180 mg/dL   Lab Results  Component Value Date   GLUCAP 200 (H) 12/27/2015   HGBA1C 8.3 (H) 10/11/2015    Review of Glycemic Control:  Results for VAISHNAV, MATTIELLO (MRN [id number]) as of 12/27/2015 11:41  Ref. Range 12/26/2015 10:17 12/26/2015 22:22 12/27/2015 07:52  Glucose-Capillary Latest Ref Range: 65 - 99 mg/dL 78 276 (H) 200 (H)   Diabetes history: Type 2 diabetes Outpatient Diabetes medications: Levemir 75 units q HS Current orders for Inpatient glycemic control:  Levemir 35 units q HS, Novolog 4 units tid with meals, Novolog moderate tid with meals and HS  Inpatient Diabetes Program Recommendations:    Please consider increasing Levemir to 45 units q HS, and increase Novolog meal coverage to 8 units tid with meals.   Thanks, Adah Perl, RN, BC-ADM Inpatient Diabetes Coordinator Pager [phone] (8a-5p)

## 2015-12-27 NOTE — Care Management Note (Signed)
Case Management Note  Patient Details  Name: Joseph Mcdonald MRN: [id number] Date of Birth: 1950-04-01  Subjective/Objective:                Presented with altered mental status, hypotension. PMH: alcohol abuse, CAD,CABG -2011,hyperlipidemia, hypertension,DM, CKD, CBP, anxiety, depression. From home with wife. Independent with ADL's. No DME usage.  Bradden Hardwell (Spouse)     [phone]        PCP: Wendee Beavers      Action/Plan: Return to home when medically stable. CM to f/u with disposition needs.  Expected Discharge Date:                  Expected Discharge Plan:  Home/Self Care  In-House Referral:     Discharge planning Services  CM Consult  Post Acute Care Choice:    Choice offered to:     DME Arranged:    DME Agency:     HH Arranged:    HH Agency:     Status of Service:  In process, will continue to follow  If discussed at Long Length of Stay Meetings, dates discussed:    Additional Comments:  Sharin Mons, RN 12/27/2015, 9:51 AM

## 2015-12-27 NOTE — Progress Notes (Signed)
Family Medicine Teaching Service Daily Progress Note Intern Pager: [phone]  Patient name: Joseph Mcdonald Medical record number: [id number] Date of birth: [birth year] Age: 65 y.o. Gender: male  Primary Care Provider: Mercy Riding, MD Consultants: Critical Care Code Status: Full  Pt Overview and Major Events to Date:  12/26/15: 6L IV fluids, Levophed 12/27/15: Continue to monitor fluid status and cardiology work up  Assessment and Plan: Joseph Mcdonald is a 65 y.o. male presenting with altered mental status, hypotension. PMH is significant for alcohol abuse, CAD status post CABG in 2011 and hyperlipidemia, hypertension, type 2 diabetes, chronic kidney disease, chronic back pain, anxiety, depression  Hypovolemic Shock: Resolved. Normotensive this morning and hemodynamically stable. Stable status post 6 L and levophed in the ED. Lactic acidosis due to shock has resolved. Etiology unclear, could be combination of dehydration as patient only drinking >2L soda per day, and new anti-hypertensives, as lisinopril added to his regimen on Monday. No signs of internal or external bleeding. Will need to rule out possible cardiogenic shock as patient also had EKG changes of ST elevated on admission.  - Will continue maintenance fluids with NS until we confirm patient is taking good PO, additionally on fluids due to Acute on Chronic CKD - Vitals per floor policy - Holding blood pressure medications for now  Lactic Acidosis: Resolved. Lactic acid normal as above. Likely from tissue hypoperfusion.   Abnormal EKG with elevated Troponin: EKG on admission was remarkable for ST changes. Troponins were not trended overnight however. Cardiology saw patient on 10/3 in the ED and noted this is likely due to global ischemia. Troponin this morning remarkable for 0.4. -Cardiology consulted, appreciate assistance - Will continue to cycle troponins - Continuous cardiac monitoring  - STAT EKG for any  chest pain  Abdominal pain:  Resolved. Negative FAST exam. Negative CT abdomen.  Pain relief after having bowel movement. Reported constipation for several days prior to admission.   - dulcolax suppository PRN  Acute on Chronic CKD: Suspect prerenal etiology in the setting of hypotension and volume depletion. Stage III Kidney disease at baseline; Creatinine today 3.9 down from 5.3 yesterday. Creatinine baseline 2.2-2.4 -Monitor daily BMP -Daily fluids  Type 2 diabetes:  Last HgA1c 8.3. Patient on levemir 75U at bedtime and novolog 30units 3x daily with meals.   - 35U levemir and SSI - qACHS CBGs  Chronic back pain:   - continue oxycodone-acetaminophen 5-325mg  PRN - continue gabapentin 600mg  BID  Anxiety/depression: Stable - continue lexapro 10mg  daily - holding klonopin 0.5mg  PRN  Anemia: Unclear baseline hgb as he has a wide range from 9.1-12.1 based on Epic review. Hgb 9.1 this morning. No signs of any bleeding at this time. Hgb may be low due to dilutional effect from receiving so much fluids. - daily CBC - Could consider iron studies, B12 folate if anemia persists  Hx of Hypertension:  Home hydralazine 50 mg TID, amlodipine 5 mg BID and lisinopril 20 mg daily. BP currently 135/58.  - holding BP medications given hypotension on presentation  FEN/GI: soft diet, D5 1/2 NS @125cc /hr PPx: SubQ Heparin  Disposition: Continue to monitor on telemetry  Subjective:  Patient is laying in bed this morning. He says he slept well and is feeling much better. He says he feels ready to go home. He says that this all happened right after he took 3 of his blood pressure medications at the same time. He says his doctor restarted him on Lisinopril on Monday. Patient says  he took one dose of Percocet yesterday morning.  Patient denies chest pain, lightheadedness, headache, shortness of breath, abdominal pain, diarrhea, and constipation. His last bowel movement was last night at the  hospital.  Objective: Temp:  [93.6 F (34.2 C)-98.7 F (37.1 C)] 98.5 F (36.9 C) (10/04 0600) Pulse Rate:  [54-79] 54 (10/04 0600) Resp:  [16-33] 18 (10/04 0600) BP: (52-150)/(0-74) 135/58 (10/04 0600) SpO2:  [93 %-100 %] 100 % (10/04 0600) Weight:  [177 lb 8 oz (80.5 kg)] 177 lb 8 oz (80.5 kg) (10/03 1800) Physical Exam: General: No apparent distress, sitting up in bed eating breakfast Cardiovascular: RRR, s1/s2 present with no murmurs Respiratory: normal work of breathing, CTA B/L, no wheezing or rhonchi Gastrointestinal: soft, moderately distended, no tenderness, no guarding or rebounding MSK: no clubbing, cyanosis, 1+ LE pitting edema bilaterally Derm: warm and dry Neuro: AAOx3 Psych: normal mood and affect   Laboratory:  Recent Labs Lab 12/26/15 1019 12/26/15 1022 12/27/15 0850  WBC  --  4.3 9.1  HGB 10.5* 10.4* 9.1*  HCT 31.0* 30.4* 27.1*  PLT  --  293  286 198    Recent Labs Lab 12/26/15 1019 12/26/15 1022 12/27/15 0548  NA 135 136 135  K 3.3* 3.2* 4.4  CL 106 107 110  CO2  --  13* 17*  BUN 67* 69* 55*  CREATININE 5.30* 5.10* 3.92*  CALCIUM  --  7.6* 7.1*  PROT  --  5.5* 4.8*  BILITOT  --  0.6 0.3  ALKPHOS  --  37* 32*  ALT  --  11* 13*  AST  --  15 15  GLUCOSE 91 88 228*    Imaging/Diagnostic Tests: Ct Abdomen Pelvis Wo Contrast  Result Date: 12/26/2015 CLINICAL DATA:  Abdominal bloating, shortness of breath. Concern for sepsis. End-stage renal disease. EXAM: CT ABDOMEN AND PELVIS WITHOUT CONTRAST TECHNIQUE: Multidetector CT imaging of the abdomen and pelvis was performed following the standard protocol without IV contrast. COMPARISON:  None. FINDINGS: Lower chest: Left lower lobe opacities, likely atelectasis. Linear right base atelectasis. No effusions. Heart is mildly enlarged. Hepatobiliary: No focal hepatic abnormality. Gallbladder unremarkable. Pancreas: No focal abnormality or ductal dilatation. Spleen: No focal abnormality.  Normal size.  Adrenals/Urinary Tract: Scattered low-density and isodense areas within the kidneys bilaterally, shown on prior ultrasounds represent cysts. No hydronephrosis. Adrenal glands can't urinary bladder grossly unremarkable. Stomach/Bowel: Moderate stool burden in the rectosigmoid colon. Stomach is mildly distended with fluid/ food. Evidence of bowel obstruction. Vascular/Lymphatic: Aortic and iliac calcifications. No aneurysm. No adenopathy. Reproductive: Prostate mildly prominent. No visible focal abnormality. Other: No free fluid or free air.  No focal fluid collection. Musculoskeletal: No acute bony abnormality or focal bone lesion. IMPRESSION: Bibasilar opacities, left greater than right, likely atelectasis. Mild cardiomegaly, coronary artery disease, aortoiliac atherosclerosis. Mildly distended stomach.  No evidence of bowel obstruction. No acute findings in the abdomen or pelvis. Electronically Signed   By: Rolm Baptise M.D.   On: 12/26/2015 11:01   Dg Chest Portable 1 View  Result Date: 12/26/2015 CLINICAL DATA:  65 year old male with history of hypotension and abdominal cramping for the past 2 days. EXAM: PORTABLE CHEST 1 VIEW COMPARISON:  Chest x-ray 01/03/2010. FINDINGS: Low lung volumes. No acute consolidative airspace disease. No pleural effusions. Mild cardiomegaly. Cephalization of the pulmonary vasculature with indistinctness of the interstitial markings, suggestive of underlying interstitial pulmonary edema. Aortic atherosclerosis. Status post median sternotomy for CABG. Transcutaneous defibrillator pads are seen projecting over the lower left hemithorax. IMPRESSION:  1. The appearance the chest suggests mild congestive heart failure. 2. Low lung volumes. 3. Aortic atherosclerosis. Electronically Signed   By: Vinnie Langton M.D.   On: 12/26/2015 11:09    Farrel Conners, Medical Student 12/27/2015, 9:44 AM [place] Intern pager: [phone], text pages welcome  I have  separately seen and examined the patient. I have discussed the findings and exam with student doctor Farrel Conners and agree with the above note.  My changes/additions are outlined in BLUE.   Smitty Cords, MD [place], PGY-2

## 2015-12-28 ENCOUNTER — Inpatient Hospital Stay (HOSPITAL_COMMUNITY): Payer: Medicare Other

## 2015-12-28 DIAGNOSIS — N179 Acute kidney failure, unspecified: Secondary | ICD-10-CM

## 2015-12-28 DIAGNOSIS — A419 Sepsis, unspecified organism: Principal | ICD-10-CM

## 2015-12-28 DIAGNOSIS — R6510 Systemic inflammatory response syndrome (SIRS) of non-infectious origin without acute organ dysfunction: Secondary | ICD-10-CM

## 2015-12-28 LAB — COMPREHENSIVE METABOLIC PANEL
ALBUMIN: 2.9 g/dL — AB (ref 3.5–5.0)
ALT: 14 U/L — ABNORMAL LOW (ref 17–63)
ANION GAP: 8 (ref 5–15)
AST: 32 U/L (ref 15–41)
Alkaline Phosphatase: 41 U/L (ref 38–126)
BUN: 39 mg/dL — ABNORMAL HIGH (ref 6–20)
CO2: 17 mmol/L — AB (ref 22–32)
Calcium: 7.6 mg/dL — ABNORMAL LOW (ref 8.9–10.3)
Chloride: 105 mmol/L (ref 101–111)
Creatinine, Ser: 3.65 mg/dL — ABNORMAL HIGH (ref 0.61–1.24)
GFR calc Af Amer: 19 mL/min — ABNORMAL LOW (ref >60)
GFR calc non Af Amer: 16 mL/min — ABNORMAL LOW (ref >60)
GLUCOSE: 210 mg/dL — AB (ref 65–99)
POTASSIUM: 5 mmol/L (ref 3.5–5.1)
SODIUM: 130 mmol/L — AB (ref 135–145)
Total Bilirubin: 0.3 mg/dL (ref 0.3–1.2)
Total Protein: 5.3 g/dL — ABNORMAL LOW (ref 6.5–8.1)

## 2015-12-28 LAB — URINALYSIS W MICROSCOPIC (NOT AT ARMC)
BILIRUBIN URINE: NEGATIVE
Glucose, UA: 250 mg/dL — AB
Ketones, ur: NEGATIVE mg/dL
Nitrite: NEGATIVE
PH: 5.5 (ref 5.0–8.0)
Protein, ur: 100 mg/dL — AB
SPECIFIC GRAVITY, URINE: 1.007 (ref 1.005–1.030)

## 2015-12-28 LAB — BASIC METABOLIC PANEL
ANION GAP: 7 (ref 5–15)
BUN: 37 mg/dL — ABNORMAL HIGH (ref 6–20)
CO2: 17 mmol/L — ABNORMAL LOW (ref 22–32)
Calcium: 7.5 mg/dL — ABNORMAL LOW (ref 8.9–10.3)
Chloride: 109 mmol/L (ref 101–111)
Creatinine, Ser: 3.58 mg/dL — ABNORMAL HIGH (ref 0.61–1.24)
GFR calc Af Amer: 19 mL/min — ABNORMAL LOW (ref >60)
GFR, EST NON AFRICAN AMERICAN: 16 mL/min — AB (ref >60)
GLUCOSE: 146 mg/dL — AB (ref 65–99)
POTASSIUM: 4.6 mmol/L (ref 3.5–5.1)
SODIUM: 133 mmol/L — AB (ref 135–145)

## 2015-12-28 LAB — CBC
HCT: 24.6 % — ABNORMAL LOW (ref 39.0–52.0)
HEMOGLOBIN: 8.2 g/dL — AB (ref 13.0–17.0)
MCH: 30.7 pg (ref 26.0–34.0)
MCHC: 33.3 g/dL (ref 30.0–36.0)
MCV: 92.1 fL (ref 78.0–100.0)
PLATELETS: 187 10*3/uL (ref 150–400)
RBC: 2.67 MIL/uL — AB (ref 4.22–5.81)
RDW: 13.1 % (ref 11.5–15.5)
WBC: 21.6 10*3/uL — AB (ref 4.0–10.5)

## 2015-12-28 LAB — GLUCOSE, CAPILLARY
GLUCOSE-CAPILLARY: 222 mg/dL — AB (ref 65–99)
GLUCOSE-CAPILLARY: 149 mg/dL — AB (ref 65–99)
GLUCOSE-CAPILLARY: 170 mg/dL — AB (ref 65–99)
Glucose-Capillary: 131 mg/dL — ABNORMAL HIGH (ref 65–99)

## 2015-12-28 LAB — LACTIC ACID, PLASMA
LACTIC ACID, VENOUS: 1.1 mmol/L (ref 0.5–1.9)
Lactic Acid, Venous: 1.8 mmol/L (ref 0.5–1.9)
Lactic Acid, Venous: 1.8 mmol/L (ref 0.5–1.9)

## 2015-12-28 LAB — TROPONIN I
TROPONIN I: 0.25 ng/mL — AB (ref <0.03)
Troponin I: 5.05 ng/mL (ref <0.03)

## 2015-12-28 LAB — PROTIME-INR
INR: 1.17
PROTHROMBIN TIME: 14.9 s (ref 11.4–15.2)

## 2015-12-28 LAB — BRAIN NATRIURETIC PEPTIDE: B Natriuretic Peptide: 1057.5 pg/mL — ABNORMAL HIGH (ref 0.0–100.0)

## 2015-12-28 LAB — PROCALCITONIN
PROCALCITONIN: 6.87 ng/mL
PROCALCITONIN: 11.36 ng/mL

## 2015-12-28 LAB — APTT: APTT: 39 s — AB (ref 24–36)

## 2015-12-28 MED ORDER — VANCOMYCIN HCL IN DEXTROSE 1-5 GM/200ML-% IV SOLN
1000.0000 mg | INTRAVENOUS | Status: AC
  Administered 2015-12-28: 1000 mg via INTRAVENOUS
  Filled 2015-12-28: qty 200

## 2015-12-28 MED ORDER — IPRATROPIUM-ALBUTEROL 0.5-2.5 (3) MG/3ML IN SOLN
3.0000 mL | Freq: Once | RESPIRATORY_TRACT | Status: AC
  Administered 2015-12-28: 3 mL via RESPIRATORY_TRACT
  Filled 2015-12-28: qty 3

## 2015-12-28 MED ORDER — SODIUM CHLORIDE 0.9 % IV BOLUS (SEPSIS)
500.0000 mL | Freq: Once | INTRAVENOUS | Status: AC
  Administered 2015-12-28: 500 mL via INTRAVENOUS

## 2015-12-28 MED ORDER — DM-GUAIFENESIN ER 30-600 MG PO TB12: 1.0000 | ORAL_TABLET | Freq: Two times a day (BID) | ORAL | Status: DC

## 2015-12-28 MED ORDER — ASPIRIN 325 MG PO TABS
325.0000 mg | ORAL_TABLET | Freq: Every day | ORAL | Status: DC
  Administered 2015-12-29 (×2): 325 mg via ORAL
  Filled 2015-12-28 (×2): qty 1

## 2015-12-28 MED ORDER — SODIUM CHLORIDE 0.9 % IV BOLUS (SEPSIS)
1000.0000 mL | Freq: Once | INTRAVENOUS | Status: AC
  Administered 2015-12-28: 1000 mL via INTRAVENOUS

## 2015-12-28 MED ORDER — GUAIFENESIN ER 600 MG PO TB12
600.0000 mg | ORAL_TABLET | Freq: Two times a day (BID) | ORAL | Status: AC
  Administered 2015-12-28 – 2015-12-31 (×6): 600 mg via ORAL
  Filled 2015-12-28 (×6): qty 1

## 2015-12-28 MED ORDER — METOPROLOL TARTRATE 5 MG/5ML IV SOLN
INTRAVENOUS | Status: AC
  Filled 2015-12-28: qty 5

## 2015-12-28 MED ORDER — HEPARIN BOLUS VIA INFUSION
2000.0000 [IU] | Freq: Once | INTRAVENOUS | Status: AC
  Administered 2015-12-28: 2000 [IU] via INTRAVENOUS
  Filled 2015-12-28: qty 2000

## 2015-12-28 MED ORDER — CEFEPIME HCL 1 G IJ SOLR
1.0000 g | INTRAMUSCULAR | Status: DC
  Administered 2015-12-28 – 2015-12-31 (×4): 1 g via INTRAVENOUS
  Filled 2015-12-28 (×4): qty 1

## 2015-12-28 MED ORDER — HEPARIN (PORCINE) IN NACL 100-0.45 UNIT/ML-% IJ SOLN
1000.0000 [IU]/h | INTRAMUSCULAR | Status: DC
  Administered 2015-12-28: 1000 [IU]/h via INTRAVENOUS
  Filled 2015-12-28: qty 250

## 2015-12-28 MED ORDER — FENTANYL CITRATE (PF) 100 MCG/2ML IJ SOLN
INTRAMUSCULAR | Status: AC
  Filled 2015-12-28: qty 2

## 2015-12-28 MED ORDER — AZTREONAM 2 G IJ SOLR
2.0000 g | INTRAMUSCULAR | Status: DC
  Filled 2015-12-28: qty 2

## 2015-12-28 MED ORDER — ASPIRIN 325 MG PO TABS: 325.0000 mg | ORAL_TABLET | Freq: Every day | ORAL | Status: DC

## 2015-12-28 MED ORDER — VANCOMYCIN HCL IN DEXTROSE 1-5 GM/200ML-% IV SOLN
1000.0000 mg | INTRAVENOUS | Status: DC
  Administered 2015-12-29 – 2015-12-31 (×2): 1000 mg via INTRAVENOUS
  Filled 2015-12-28 (×2): qty 200

## 2015-12-28 NOTE — Progress Notes (Addendum)
Cardiologist and Family medicine at the bedside at 2322. EKG completed as ordered. Request for bed in SDU placed.

## 2015-12-28 NOTE — Progress Notes (Signed)
MD at bedside at 2040. RN notified MD about expiratory wheezing, non productive cough generalized edema, SOB and UA results.

## 2015-12-28 NOTE — Progress Notes (Signed)
ANTICOAGULATION CONSULT NOTE - Initial Consult  Pharmacy Consult for Heparin Indication: NSTEMI  Allergies  Allergen Reactions  . Penicillins Hives and Itching    Has patient had a PCN reaction causing immediate rash, facial/tongue/throat swelling, SOB or lightheadedness with hypotension: Yes Has patient had a PCN reaction causing severe rash involving mucus membranes or skin necrosis: Yes Has patient had a PCN reaction that required hospitalization No Has patient had a PCN reaction occurring within the last 10 years: No If all of the above answers are "NO", then may proceed with Cephalosporin use.     Patient Measurements: Height: 5\' 4"  (162.6 cm) Weight: 177 lb 8 oz (80.5 kg) IBW/kg (Calculated) : 59.2 Heparin Dosing Weight: 75 kg  Vital Signs: Temp: 100 F (37.8 C) (10/05 2304) Temp Source: Oral (10/05 2304) BP: 135/55 (10/05 2304) Pulse Rate: 65 (10/05 2304)  Labs:  Recent Labs  12/26/15 1022 12/26/15 2249 12/27/15 0548 12/27/15 0850  12/27/15 2012 12/28/15 0119 12/28/15 0853 12/28/15 1038 12/28/15 2144  HGB 10.4*  --   --  9.1*  --   --   --  8.2*  --   --   HCT 30.4*  --   --  27.1*  --   --   --  24.6*  --   --   PLT 293  286  --   --  198  --   --   --  187  --   --   APTT 22*  --   --   --   --   --   --   --  39*  --   LABPROT 15.1  --   --   --   --   --   --   --  14.9  --   INR 1.18  --   --   --   --   --   --   --  1.17  --   CREATININE 5.10*  --  3.92*  --   --   --   --  3.58* 3.65*  --   CKTOTAL  --  94  --   --   --   --   --   --   --   --   TROPONINI  --   --   --   --   < > 0.26* 0.25*  --   --  5.05*  < > = values in this interval not displayed.  Estimated Creatinine Clearance: 19.1 mL/min (by C-G formula based on SCr of 3.65 mg/dL (H)).   Medical History: Past Medical History:  Diagnosis Date  . Anxiety   . Depression   . Diabetes mellitus without complication ([place])   . Hypertension   . Peripheral neuropathy ([facility])      Medications:  Prescriptions Prior to Admission  Medication Sig Dispense Refill Last Dose  . allopurinol (ZYLOPRIM) 100 MG tablet Take 100 mg by mouth daily.   12/26/2015 at Unknown time  . amLODipine (NORVASC) 5 MG tablet Take 5 mg by mouth 2 (two) times daily.    12/26/2015 at Unknown time  . aspirin 81 MG tablet Take 1 tablet (81 mg total) by mouth daily. 30 tablet  12/26/2015 at Unknown time  . calcium acetate (PHOSLO) 667 MG capsule Take 667 mg by mouth 3 (three) times daily with meals.   12/25/2015  . clonazePAM (KLONOPIN) 0.5 MG tablet Take 1 tablet (0.5 mg total) by mouth 2 (two) times  daily as needed for anxiety. 20 tablet 0 12/26/2015 at Unknown time  . escitalopram (LEXAPRO) 10 MG tablet Take 1 tablet (10 mg total) by mouth daily. 30 tablet 1 12/26/2015 at Unknown time  . gabapentin (NEURONTIN) 600 MG tablet Take 1 tablet (600 mg total) by mouth 2 (two) times daily. 60 tablet 0 12/26/2015 at Unknown time  . hydrALAZINE (APRESOLINE) 50 MG tablet Take 50 mg by mouth 3 (three) times daily.   12/26/2015 at Unknown time  . LEVEMIR FLEXTOUCH 100 UNIT/ML Pen Inject 75 Units into the skin at bedtime.   12/25/2015 at Unknown time  . lisinopril (PRINIVIL,ZESTRIL) 20 MG tablet Take 1 tablet (20 mg total) by mouth daily. 90 tablet 3 12/26/2015 at Unknown time  . NOVOLOG FLEXPEN 100 UNIT/ML FlexPen Inject 30 Units into the skin See admin instructions. 3 times daily with meals per sliding scale   12/26/2015 at 3am  . oxyCODONE-acetaminophen (ROXICET) 5-325 MG tablet Take 1 tablet by mouth every 8 (eight) hours as needed for severe pain. 90 tablet 0 12/26/2015 at Unknown time  . simvastatin (ZOCOR) 80 MG tablet    12/26/2015 at Unknown time    Assessment: 65 y.o. M presents with AMS and hypotension. Known to pharmacy from abx dosing. Pt now with elevated trop to 5.05 and to begin heparin for NSTEMI. Hgb 8.2 (was 10.1 on admission). Plt ok. SQ heparin 5000 units given at 2200.  Goal of Therapy:  Heparin  level 0.3-0.7 units/ml Monitor platelets by anticoagulation protocol: Yes   Plan:  D/c SQ heparin Heparin IV bolus 2000 units (half bolus with recent SQ dose given) Heparin gtt at 1000 units/hr Will f/u heparin level in 8 hours Daily heparin level and CBC  Sherlon Handing, PharmD, BCPS Clinical pharmacist, pager [phone] 12/28/2015,11:23 PM

## 2015-12-28 NOTE — Progress Notes (Signed)
Pharmacy Antibiotic Note Joseph Mcdonald is a 65 y.o. male admitted on 12/26/2015 with sepsis/pna. Pharmacy has been consulted for cefepime and vancomycin dosing.   Plan: 1. Vancomycin 1000 IV every 48 hours.  Goal trough 15-20 mcg/mL.  2. Cefepime 1 gram IV every 24 hours 3. Follow up culture data and narrow abx as feasible     Height: 5\' 4"  (162.6 cm) Weight: 177 lb 8 oz (80.5 kg) IBW/kg (Calculated) : 59.2  Temp (24hrs), Avg:100.5 F (38.1 C), Min:99.2 F (37.3 C), Max:102.8 F (39.3 C)   Recent Labs Lab 12/26/15 1019 12/26/15 1022 12/26/15 1407 12/27/15 0548 12/27/15 0850 12/28/15 0853 12/28/15 1038  WBC  --  4.3  --   --  9.1 21.6*  --   CREATININE 5.30* 5.10*  --  3.92*  --  3.58* 3.65*  LATICACIDVEN 2.84*  --  1.46  --   --   --  1.8    Estimated Creatinine Clearance: 19.1 mL/min (by C-G formula based on SCr of 3.65 mg/dL (H)).    Allergies  Allergen Reactions  . Penicillins Hives and Itching    Has patient had a PCN reaction causing immediate rash, facial/tongue/throat swelling, SOB or lightheadedness with hypotension: Yes Has patient had a PCN reaction causing severe rash involving mucus membranes or skin necrosis: Yes Has patient had a PCN reaction that required hospitalization No Has patient had a PCN reaction occurring within the last 10 years: No If all of the above answers are "NO", then may proceed with Cephalosporin use.     Antimicrobials this admission: 10/5 Cefepime >>  10/5 vancomycin >>   Dose adjustments this admission: n/a  Microbiology results: 10/5 BCx: px 10/3 BCx: ngtd 10/3 UCx: insignificant gowtth < 10k   Thank you for allowing pharmacy to be a part of this patient's care.  Vincenza Hews, PharmD, BCPS 12/28/2015, 1:27 PM Pager: [phone]

## 2015-12-28 NOTE — Progress Notes (Addendum)
Went to evaluate Mr. Lorne Skeens. Notes worsening in how he feels overall with some increasing shortness of breath. Nursing reports he looks more fluid overloaded compared to when she saw him yesterday. Mild fever of 100.79F noted with stable blood pressure and O2 saturation of 98% on room air. Mild edema noted on exam. Lungs clear to auscultation. Diaphoresis noted. Capillary refill <3seconds.  Urinalysis with rare bacteria, moderate hemoglobin, small leukocytes. Urine culture pending. Leukocytosis this morning 21.6. CXR from this morning with improving pulmonary interstitial edema, atelectasis in left mid and lower lung, and small bilateral pleural effusions. Continues to meet SIRS Criteria.   Echo from 12/26/15 with EF 55-60% and grade 2 diastolic dysfunction. Currently receiving NS@125cc /hr and has received several boluses. Troponin trended during hospitalization: 0.4>0.31>0.26>0.25.  Will KVO fluids for now and keep close eye on blood pressures. Will check troponin and BNP. Check lactic acid. Currently on broad spectrum antibiotics with Cefepime and Vancomycin. Will give Duoneb x1. Continue Mucinex. Vitals q2hr. If continues to worsen, consider transfer to SDU.  Dr. Gerlean Ren 12/28/15, 9:44 PM

## 2015-12-28 NOTE — Progress Notes (Signed)
FMTS Attending Daily Note:  Chrisandra Netters MD Personal pager:  [phone] FPTS Service Pager:  [phone]  Well appearing on my exam this PM though with frequent cough, reports feeling chest congestion. Overnight fever to 102.8 noted. Abdominal exam benign. NO nuchal rigidity or signs of meningismus on exam.   SIRS/sepsis - Suspect secondary to HAP vs aspiration pneumonia given focality of LLL area on CXR, along with clinical picture of cough & chest congestion. May have aspirated during his syncopal event pior to admission. Covered with broad spectrum abx presently (vanc/cefepime). Also notes some dysuria - await UA results as this could be source, given cath for >24 hrs upon admission. BP's downtrending overnight into the morning, though responded well to fluid bolus. monitor closely, very low threshold for transfer to SDU if does not continue to improve or if any worsening. Add mucinex for cough.  Other problems stable at this time.  Will sign resident note when it is completed.   Leeanne Rio, MD 12/28/2015

## 2015-12-28 NOTE — Consult Note (Signed)
Follow up note:  I was asked to take another look at Joseph Mcdonald given new lab abnormalities;  Joseph Mcdonald  has a past medical history of Anxiety; Depression; Diabetes mellitus without complication ([place]); Hypertension; and Peripheral neuropathy ([place]). CAGB in 2011 and HLD. CKD. Presented on the 3rd with abdominal pain and hypotension, syncopal episode. Found to have sepsis. Now with rising WBC. Had mild trop elevation since admission. Tonight complained of worse generalized weakness and SOB. He is currently febrile. Trop checked and came back at 5 (previously 0.2). No CP.   Exam: diaphoretic, normal filling pressures. Normal cardiac exam. Mild pulm crackles, LEE 2+.  ECG with lateral wall T wave flattening and AVL T wave inversion, Otherwise NSR and 1AVB.   Recommendations: Joseph Mcdonald has a history of CABG (4V) in 2011. NO relook since then. TTE 2 days ago was normal. No WMA. Now with type NSTEMI in setting of sepsis (PNA, uro?).  - Start heparin (watch for bleeding given low Hb and Hb in urine) - start ASA 325 - watch for symptoms. - trend ECG - Will need LHC once infection is under control   Cristina Gong, MD

## 2015-12-28 NOTE — Progress Notes (Signed)
Family Medicine Teaching Service Daily Progress Note Intern Pager: [phone]  Patient name: Joseph Mcdonald Medical record number: [id number] Date of birth: 09-Nov-1950 Age: 65 y.o. Gender: male  Primary Care Provider: Mercy Riding, MD Consultants: Critical Care Code Status: Full  Pt Overview and Major Events to Date:  12/26/15: 6L IV fluids, Levophed 12/27/15: Continue to monitor fluid status and cardiology work up  Assessment and Plan: Joseph Mcdonald is a 65 y.o. male presenting with altered mental status, hypotension. PMH is significant for alcohol abuse, CAD status post CABG in 2011 and hyperlipidemia, hypertension, type 2 diabetes, chronic kidney disease, chronic back pain, anxiety, depression  Fevers of unknown origin: Reportedly had a fever to 102.8, repeat temps werr 100.8 and 100.6 after tylenol. Denied any rigors. Blood cultures ordered, awaiting collection.  Leukocytosis to 21 up from 9 yesterday.  - f/u fever curve - CBC, BMP and blood cultures - will likely start broad spectrum antibiotics empirically  Hypovolemic Shock: Resolved. Normotensive this morning and hemodynamically stable. Stable status post 6 L and levophed in the ED. Lactic acidosis due to shock has resolved. Etiology unclear, could be combination of dehydration as patient only drinking >2L soda per day, and new anti-hypertensives, as lisinopril added to his regimen on Monday. No signs of internal or external bleeding. Will need to rule out possible cardiogenic shock as patient also had EKG changes of ST elevated on admission.  - Will continue maintenance fluids with NS until we confirm patient is taking good PO, additionally on fluids due to Acute on Chronic CKD - Vitals per floor policy - Holding blood pressure medications for now  Lactic Acidosis: Resolved. Lactic acid normal as above. Likely from tissue hypoperfusion. Trending procalcitonin  Abnormal EKG with elevated Troponin: EKG on admission  was remarkable for ST changes. Troponins were not trended overnight however. Cardiology saw patient on 10/3 in the ED and noted this is likely due to global ischemia. Troponin trending down to 0.25 from 0.4.    -Cardiology consulted, appreciate assistance - Will continue to cycle troponins - Continuous cardiac monitoring  - STAT EKG for any chest pain  Abdominal pain:  Resolved. Negative FAST exam. Negative CT abdomen.  Pain relief after having bowel movement. Reported constipation for several days prior to admission.   - dulcolax suppository PRN  Acute on Chronic CKD: Suspect prerenal etiology in the setting of hypotension and volume depletion. Stage III Kidney disease at baseline; Creatinine today 3.65 down from 5.3 upon admission. Creatinine baseline 2.2-2.4 -Monitor daily BMP -Daily fluids  Type 2 diabetes:  Last HgA1c 8.3. Patient on levemir 75U at bedtime and novolog 30units 3x daily with meals.   CBG today was 149.  - 35U levemir and SSI - qACHS CBGs  Chronic back pain:   - continue oxycodone-acetaminophen 5-325mg  PRN - continue gabapentin 600mg  BID  Anxiety/depression: Stable - continue lexapro 10mg  daily - holding klonopin 0.5mg  PRN  Anemia: Unclear baseline hgb as he has a wide range from 9.1-12.1 based on Epic review. Hgb 9.1 this morning. No signs of any bleeding at this time. Hgb may be low due to dilutional effect from receiving so much fluids. - daily CBC - Could consider iron studies, B12 folate if anemia persists  Hx of Hypertension:  Home hydralazine 50 mg TID, amlodipine 5 mg BID and lisinopril 20 mg daily. BP currently 135/58.  - holding BP medications given hypotension on presentation  FEN/GI: soft diet, D5 1/2 NS @125cc /hr PPx: SubQ Heparin  Disposition:  Continue to monitor on telemetry  Subjective:  Patient is laying in bed this morning. He says he slept well and is feeling much better. He says he feels ready to go home.   Objective: Temp:  [99.2  F (37.3 C)-102.8 F (39.3 C)] 100.6 F (38.1 C) (10/05 0658) Pulse Rate:  [54-102] 75 (10/05 0658) Resp:  [16-26] 26 (10/05 0441) BP: (108-173)/(54-83) 108/54 (10/05 0658) SpO2:  [94 %-100 %] 98 % (10/05 0658) Physical Exam: General: No apparent distress, sitting up in bed eating breakfast Cardiovascular: RRR, s1/s2 present with no murmurs Respiratory: normal work of breathing, bilateral ronchi/wheezing diffusely  Gastrointestinal: soft, moderately distended, no tenderness, no guarding or rebounding MSK: no clubbing, cyanosis, 1+ LE pitting edema bilaterally Derm: warm and dry Neuro: AAOx3 Psych: normal mood and affect   Laboratory:  Recent Labs Lab 12/26/15 1019 12/26/15 1022 12/27/15 0850  WBC  --  4.3 9.1  HGB 10.5* 10.4* 9.1*  HCT 31.0* 30.4* 27.1*  PLT  --  293  286 198    Recent Labs Lab 12/26/15 1019 12/26/15 1022 12/27/15 0548  NA 135 136 135  K 3.3* 3.2* 4.4  CL 106 107 110  CO2  --  13* 17*  BUN 67* 69* 55*  CREATININE 5.30* 5.10* 3.92*  CALCIUM  --  7.6* 7.1*  PROT  --  5.5* 4.8*  BILITOT  --  0.6 0.3  ALKPHOS  --  37* 32*  ALT  --  11* 13*  AST  --  15 15  GLUCOSE 91 88 228*    Imaging/Diagnostic Tests: Ct Abdomen Pelvis Wo Contrast  Result Date: 12/26/2015 CLINICAL DATA:  Abdominal bloating, shortness of breath. Concern for sepsis. End-stage renal disease. EXAM: CT ABDOMEN AND PELVIS WITHOUT CONTRAST TECHNIQUE: Multidetector CT imaging of the abdomen and pelvis was performed following the standard protocol without IV contrast. COMPARISON:  None. FINDINGS: Lower chest: Left lower lobe opacities, likely atelectasis. Linear right base atelectasis. No effusions. Heart is mildly enlarged. Hepatobiliary: No focal hepatic abnormality. Gallbladder unremarkable. Pancreas: No focal abnormality or ductal dilatation. Spleen: No focal abnormality.  Normal size. Adrenals/Urinary Tract: Scattered low-density and isodense areas within the kidneys bilaterally,  shown on prior ultrasounds represent cysts. No hydronephrosis. Adrenal glands can't urinary bladder grossly unremarkable. Stomach/Bowel: Moderate stool burden in the rectosigmoid colon. Stomach is mildly distended with fluid/ food. Evidence of bowel obstruction. Vascular/Lymphatic: Aortic and iliac calcifications. No aneurysm. No adenopathy. Reproductive: Prostate mildly prominent. No visible focal abnormality. Other: No free fluid or free air.  No focal fluid collection. Musculoskeletal: No acute bony abnormality or focal bone lesion. IMPRESSION: Bibasilar opacities, left greater than right, likely atelectasis. Mild cardiomegaly, coronary artery disease, aortoiliac atherosclerosis. Mildly distended stomach.  No evidence of bowel obstruction. No acute findings in the abdomen or pelvis. Electronically Signed   By: Rolm Baptise M.D.   On: 12/26/2015 11:01   Dg Chest 2 View  Result Date: 12/28/2015 CLINICAL DATA:  Fever, shortness of breath. History of diabetes, acute on chronic renal failure, hypotension. EXAM: CHEST  2 VIEW COMPARISON:  Portable chest x-ray of December 26, 2015 FINDINGS: The lungs are well-expanded. The pulmonary interstitial edema has decreased. There is persistent increased interstitial density in the left mid and lower lung. Tiny pleural effusions blunt the costophrenic angles bilaterally. The cardiac silhouette is top-normal in size. The pulmonary vascularity is not engorged. There are changes from previous CABG. There is calcification in the wall of the thoracic aorta. The bony thorax exhibits no  acute abnormality. IMPRESSION: Improving pulmonary interstitial edema. Persistent atelectasis in the left mid and lower lung. Cardiomegaly without pulmonary vascular congestion. Small bilateral pleural effusions. Aortic atherosclerosis. Electronically Signed   By: David  Martinique M.D.   On: 12/28/2015 07:53   Dg Chest Portable 1 View  Result Date: 12/26/2015 CLINICAL DATA:  65 year old male with  history of hypotension and abdominal cramping for the past 2 days. EXAM: PORTABLE CHEST 1 VIEW COMPARISON:  Chest x-ray 01/03/2010. FINDINGS: Low lung volumes. No acute consolidative airspace disease. No pleural effusions. Mild cardiomegaly. Cephalization of the pulmonary vasculature with indistinctness of the interstitial markings, suggestive of underlying interstitial pulmonary edema. Aortic atherosclerosis. Status post median sternotomy for CABG. Transcutaneous defibrillator pads are seen projecting over the lower left hemithorax. IMPRESSION: 1. The appearance the chest suggests mild congestive heart failure. 2. Low lung volumes. 3. Aortic atherosclerosis. Electronically Signed   By: Vinnie Langton M.D.   On: 12/26/2015 11:09

## 2015-12-28 NOTE — Progress Notes (Addendum)
Significant troponin of 5.05 noted. EKG ordered STAT. Cardiology contacted and recommended Aspirin and Heparin gtt; stated he anticipates stent will be needed. Continues to deny chest pain, but does not shortness of breath. Transferring to SDU after EKG. Awaiting further Cardiology recommendations.  Dr. Junie Panning 12/28/15, 11:13 PM

## 2015-12-28 NOTE — Progress Notes (Signed)
FPTS Interim Progress Note  Patient seen by at 12.50 am.  Resting comfortably and states no complaints at this time.  Fever of 100.8 earlier in the night, was given Tylenol.  7 beats Vtach with no chest pain. Troponins down-trending, will continue to follow.   Lovenia Kim, MD 12/28/2015, 12:53 AM PGY-1, [place] [facility] Service pager [phone]

## 2015-12-29 ENCOUNTER — Other Ambulatory Visit: Payer: Self-pay

## 2015-12-29 ENCOUNTER — Inpatient Hospital Stay (HOSPITAL_COMMUNITY): Payer: Medicare Other

## 2015-12-29 DIAGNOSIS — F1011 Alcohol abuse, in remission: Secondary | ICD-10-CM

## 2015-12-29 DIAGNOSIS — R6510 Systemic inflammatory response syndrome (SIRS) of non-infectious origin without acute organ dysfunction: Secondary | ICD-10-CM

## 2015-12-29 DIAGNOSIS — I25709 Atherosclerosis of coronary artery bypass graft(s), unspecified, with unspecified angina pectoris: Secondary | ICD-10-CM

## 2015-12-29 DIAGNOSIS — I959 Hypotension, unspecified: Secondary | ICD-10-CM

## 2015-12-29 DIAGNOSIS — D638 Anemia in other chronic diseases classified elsewhere: Secondary | ICD-10-CM

## 2015-12-29 LAB — CBC WITH DIFFERENTIAL/PLATELET
BASOS PCT: 0 %
Basophils Absolute: 0 10*3/uL (ref 0.0–0.1)
EOS ABS: 0.6 10*3/uL (ref 0.0–0.7)
EOS PCT: 4 %
HCT: 23.9 % — ABNORMAL LOW (ref 39.0–52.0)
HEMOGLOBIN: 7.8 g/dL — AB (ref 13.0–17.0)
LYMPHS ABS: 1.7 10*3/uL (ref 0.7–4.0)
Lymphocytes Relative: 11 %
MCH: 30.5 pg (ref 26.0–34.0)
MCHC: 32.6 g/dL (ref 30.0–36.0)
MCV: 93.4 fL (ref 78.0–100.0)
MONOS PCT: 9 %
Monocytes Absolute: 1.4 10*3/uL — ABNORMAL HIGH (ref 0.1–1.0)
NEUTROS PCT: 76 %
Neutro Abs: 12.3 10*3/uL — ABNORMAL HIGH (ref 1.7–7.7)
PLATELETS: 172 10*3/uL (ref 150–400)
RBC: 2.56 MIL/uL — AB (ref 4.22–5.81)
RDW: 13.5 % (ref 11.5–15.5)
WBC: 15.9 10*3/uL — AB (ref 4.0–10.5)

## 2015-12-29 LAB — GLUCOSE, CAPILLARY
GLUCOSE-CAPILLARY: 60 mg/dL — AB (ref 65–99)
GLUCOSE-CAPILLARY: 140 mg/dL — AB (ref 65–99)
GLUCOSE-CAPILLARY: 134 mg/dL — AB (ref 65–99)
Glucose-Capillary: 143 mg/dL — ABNORMAL HIGH (ref 65–99)

## 2015-12-29 LAB — RESPIRATORY PANEL BY PCR
Adenovirus: NOT DETECTED
BORDETELLA PERTUSSIS-RVPCR: NOT DETECTED
CHLAMYDOPHILA PNEUMONIAE-RVPPCR: NOT DETECTED
CORONAVIRUS HKU1-RVPPCR: NOT DETECTED
Coronavirus 229E: NOT DETECTED
Coronavirus NL63: NOT DETECTED
Coronavirus OC43: NOT DETECTED
Influenza A: NOT DETECTED
Influenza B: NOT DETECTED
METAPNEUMOVIRUS-RVPPCR: NOT DETECTED
Mycoplasma pneumoniae: NOT DETECTED
PARAINFLUENZA VIRUS 2-RVPPCR: NOT DETECTED
Parainfluenza Virus 1: NOT DETECTED
Parainfluenza Virus 3: NOT DETECTED
Parainfluenza Virus 4: NOT DETECTED
RESPIRATORY SYNCYTIAL VIRUS-RVPPCR: NOT DETECTED
RHINOVIRUS / ENTEROVIRUS - RVPPCR: NOT DETECTED

## 2015-12-29 LAB — BASIC METABOLIC PANEL
Anion gap: 9 (ref 5–15)
BUN: 37 mg/dL — AB (ref 6–20)
CALCIUM: 7.7 mg/dL — AB (ref 8.9–10.3)
CHLORIDE: 111 mmol/L (ref 101–111)
CO2: 15 mmol/L — ABNORMAL LOW (ref 22–32)
CREATININE: 3.34 mg/dL — AB (ref 0.61–1.24)
GFR, EST AFRICAN AMERICAN: 21 mL/min — AB (ref >60)
GFR, EST NON AFRICAN AMERICAN: 18 mL/min — AB (ref >60)
Glucose, Bld: 94 mg/dL (ref 65–99)
Potassium: 5.1 mmol/L (ref 3.5–5.1)
SODIUM: 135 mmol/L (ref 135–145)

## 2015-12-29 LAB — HEMOGLOBIN AND HEMATOCRIT, BLOOD
HCT: 27.3 % — ABNORMAL LOW (ref 39.0–52.0)
Hemoglobin: 8.9 g/dL — ABNORMAL LOW (ref 13.0–17.0)

## 2015-12-29 LAB — HEPARIN LEVEL (UNFRACTIONATED)
Heparin Unfractionated: 0.19 IU/mL — ABNORMAL LOW (ref 0.30–0.70)
Heparin Unfractionated: 0.12 IU/mL — ABNORMAL LOW (ref 0.30–0.70)

## 2015-12-29 LAB — LACTIC ACID, PLASMA: LACTIC ACID, VENOUS: 0.7 mmol/L (ref 0.5–1.9)

## 2015-12-29 LAB — TROPONIN I
TROPONIN I: 4.44 ng/mL — AB (ref <0.03)
TROPONIN I: 2.78 ng/mL — AB (ref <0.03)

## 2015-12-29 LAB — MRSA PCR SCREENING: MRSA BY PCR: NEGATIVE

## 2015-12-29 LAB — PREPARE RBC (CROSSMATCH)

## 2015-12-29 MED ORDER — VITAMIN B-1 100 MG PO TABS
100.0000 mg | ORAL_TABLET | Freq: Every day | ORAL | Status: DC
  Administered 2015-12-29 – 2016-01-02 (×4): 100 mg via ORAL
  Filled 2015-12-29 (×4): qty 1

## 2015-12-29 MED ORDER — HEPARIN SODIUM (PORCINE) 5000 UNIT/ML IJ SOLN
5000.0000 [IU] | Freq: Three times a day (TID) | INTRAMUSCULAR | Status: DC
  Administered 2015-12-29 – 2016-01-02 (×11): 5000 [IU] via SUBCUTANEOUS
  Filled 2015-12-29 (×11): qty 1

## 2015-12-29 MED ORDER — BISACODYL 10 MG RE SUPP
10.0000 mg | Freq: Every day | RECTAL | Status: DC
  Administered 2015-12-29: 10 mg via RECTAL
  Filled 2015-12-29 (×2): qty 1

## 2015-12-29 MED ORDER — POLYETHYLENE GLYCOL 3350 17 G PO PACK
17.0000 g | PACK | Freq: Every day | ORAL | Status: DC
  Administered 2015-12-29 – 2015-12-31 (×3): 17 g via ORAL
  Filled 2015-12-29 (×3): qty 1

## 2015-12-29 MED ORDER — SODIUM CHLORIDE 0.9 % IV SOLN
Freq: Once | INTRAVENOUS | Status: AC
  Administered 2015-12-29: 17:00:00 via INTRAVENOUS

## 2015-12-29 MED ORDER — METRONIDAZOLE IN NACL 5-0.79 MG/ML-% IV SOLN
500.0000 mg | Freq: Four times a day (QID) | INTRAVENOUS | Status: DC
  Administered 2015-12-29 – 2015-12-31 (×9): 500 mg via INTRAVENOUS
  Filled 2015-12-29 (×10): qty 100

## 2015-12-29 MED ORDER — LORAZEPAM 1 MG PO TABS
1.0000 mg | ORAL_TABLET | Freq: Four times a day (QID) | ORAL | Status: AC | PRN
  Administered 2015-12-29: 1 mg via ORAL
  Filled 2015-12-29: qty 1

## 2015-12-29 MED ORDER — FOLIC ACID 1 MG PO TABS
1.0000 mg | ORAL_TABLET | Freq: Every day | ORAL | Status: DC
Start: 2015-12-29 — End: 2016-01-02
  Administered 2015-12-29 – 2016-01-02 (×4): 1 mg via ORAL
  Filled 2015-12-29 (×4): qty 1

## 2015-12-29 MED ORDER — ADULT MULTIVITAMIN W/MINERALS CH
1.0000 | ORAL_TABLET | Freq: Every day | ORAL | Status: DC
  Administered 2015-12-29 – 2015-12-31 (×3): 1 via ORAL
  Filled 2015-12-29 (×3): qty 1

## 2015-12-29 MED ORDER — ASPIRIN 81 MG PO CHEW
81.0000 mg | CHEWABLE_TABLET | Freq: Once | ORAL | Status: AC
  Administered 2015-12-29: 81 mg via ORAL
  Filled 2015-12-29: qty 1

## 2015-12-29 MED ORDER — LORAZEPAM 2 MG/ML IJ SOLN: 1.0000 mg | Freq: Four times a day (QID) | INTRAMUSCULAR | Status: AC | PRN

## 2015-12-29 MED ORDER — THIAMINE HCL 100 MG/ML IJ SOLN: 100.0000 mg | Freq: Every day | INTRAMUSCULAR | Status: DC

## 2015-12-29 MED ORDER — HEPARIN (PORCINE) IN NACL 100-0.45 UNIT/ML-% IJ SOLN: 1450.0000 [IU]/h | INTRAMUSCULAR | Status: DC

## 2015-12-29 MED ORDER — INSULIN DETEMIR 100 UNIT/ML ~~LOC~~ SOLN
32.0000 [IU] | Freq: Every day | SUBCUTANEOUS | Status: DC
  Administered 2015-12-29: 32 [IU] via SUBCUTANEOUS
  Filled 2015-12-29 (×2): qty 0.32

## 2015-12-29 MED ORDER — FUROSEMIDE 10 MG/ML IJ SOLN
80.0000 mg | Freq: Once | INTRAMUSCULAR | Status: AC
  Administered 2015-12-29: 80 mg via INTRAVENOUS
  Filled 2015-12-29: qty 8

## 2015-12-29 NOTE — Progress Notes (Signed)
ANTICOAGULATION CONSULT NOTE - Follow Up Consult  Pharmacy Consult for heparin Indication: NSTEMI  Allergies  Allergen Reactions  . Penicillins Hives and Itching    Has patient had a PCN reaction causing immediate rash, facial/tongue/throat swelling, SOB or lightheadedness with hypotension: Yes Has patient had a PCN reaction causing severe rash involving mucus membranes or skin necrosis: Yes Has patient had a PCN reaction that required hospitalization No Has patient had a PCN reaction occurring within the last 10 years: No If all of the above answers are "NO", then may proceed with Cephalosporin use.     Patient Measurements: Height: 5\' 4"  (162.6 cm) Weight: 197 lb 0.1 oz (89.4 kg) IBW/kg (Calculated) : 59.2 Heparin Dosing Weight: 75 kg  Vital Signs: Temp: 98.6 F (37 C) (10/06 1623) Temp Source: Oral (10/06 1623) BP: 143/73 (10/06 1623) Pulse Rate: 67 (10/06 1623)  Labs:  Recent Labs  12/26/15 2249  12/27/15 0850  12/28/15 0119 12/28/15 0853 12/28/15 1038 12/28/15 2144 12/29/15 0544 12/29/15 0757 12/29/15 1555  HGB  --   < > 9.1*  --   --  8.2*  --   --  7.8*  --   --   HCT  --   --  27.1*  --   --  24.6*  --   --  23.9*  --   --   PLT  --   --  198  --   --  187  --   --  172  --   --   APTT  --   --   --   --   --   --  39*  --   --   --   --   LABPROT  --   --   --   --   --   --  14.9  --   --   --   --   INR  --   --   --   --   --   --  1.17  --   --   --   --   HEPARINUNFRC  --   --   --   --   --   --   --   --   --  0.19* 0.12*  CREATININE  --   < >  --   --   --  3.58* 3.65*  --  3.34*  --   --   CKTOTAL 94  --   --   --   --   --   --   --   --   --   --   TROPONINI  --   --   --   < > 0.25*  --   --  5.05* 4.44*  --   --   < > = values in this interval not displayed.  Estimated Creatinine Clearance: 21.9 mL/min (by C-G formula based on SCr of 3.34 mg/dL (H)).   Medications:  Scheduled:  . sodium chloride   Intravenous Once  . allopurinol  100  mg Oral Daily  . aspirin  325 mg Oral Daily  . atorvastatin  40 mg Oral q1800  . bisacodyl  10 mg Rectal Daily  . calcium acetate  667 mg Oral TID WC  . ceFEPime (MAXIPIME) IV  1 g Intravenous Q24H  . folic acid  1 mg Oral Daily  . gabapentin  600 mg Oral BID  . guaiFENesin  600 mg Oral BID  .  insulin aspart  0-15 Units Subcutaneous TID WC  . insulin aspart  0-5 Units Subcutaneous QHS  . insulin aspart  4 Units Subcutaneous TID WC  . insulin detemir  32 Units Subcutaneous QHS  . metronidazole  500 mg Intravenous Q6H  . multivitamin with minerals  1 tablet Oral Daily  . polyethylene glycol  17 g Oral Daily  . thiamine  100 mg Oral Daily   Or  . thiamine  100 mg Intravenous Daily  . vancomycin  1,000 mg Intravenous Q48H   Infusions:  . sodium chloride 10 mL/hr at 12/28/15 2205  . heparin 1,250 Units/hr (12/29/15 [id number])    Assessment: 65 yo male with NSTEMi is currently on subtherapeutic heparin.  Heparin level is 0.12.  No problem with infusion per RN.   Goal of Therapy:  Heparin level 0.3-0.7 units/ml Monitor platelets by anticoagulation protocol: Yes   Plan:  - increase heparin drip to 1450 units/hr - recheck heparin level in 8 hours  Kyira Volkert, Tsz-Yin 12/29/2015,5:06 PM

## 2015-12-29 NOTE — Progress Notes (Signed)
CCMD called and informed RN about Vtach. Pt VS stable, denied chest pain. MD notified, Rapid response nurse notified. Cardiologist, Dr. Ree Kida, RR nurse at the bedside. Pt transferred to [place]. Vs stable, pt alert and oriented x 4.

## 2015-12-29 NOTE — Progress Notes (Signed)
RN called for pt in [place], upon arrival to floor Dr. Ree Kida at bedside. Pt asymptomatic, alert, oriented, diaphoretic, skin warn to touch, pt denied any pain. Pt admitted for pna, RN administered Tylenol at 2204 for oral temp of 100.8. Pads placed on pt per cardiologist request, pt actually in NSR 62, BP 123/60, RR 16, 98% 2 L. Pt transferred to 2H20 as non-stemi  for elevated Troponin of 5.05 from 0.25.

## 2015-12-29 NOTE — Progress Notes (Signed)
12/29/15  0910  Looked for new IV site to run antibiotics. Not able to find vein . Notified IV team to come assess for IV site.

## 2015-12-29 NOTE — Progress Notes (Signed)
FPTS Interim Progress Note  Received report of Vtach by nursing. Rapid response team was called. Went to evaluate.  Called Cards fellow.  Joseph Mcdonald continued to deny chest pain and reported improvement in shortness of breath.  Cardiology fellow examined rhythm strip and determined rhythm shown was indicative of shivering from noted fever but looking similar to vtach-vfib.  Continue transfer to step down unit.  Will closely monitor.   Lovenia Kim, MD 12/29/2015, 12:11 AM PGY-1, [facility] Medicine Service pager [phone]

## 2015-12-29 NOTE — Progress Notes (Signed)
Pt HR as low as 38 sustaining in the low 40s. Other VSS, normal respirations minus expiratory wheezes. MD paged, will continue to monitor.

## 2015-12-29 NOTE — Progress Notes (Signed)
Patient Name: Joseph Mcdonald Date of Encounter: 12/29/2015  Hospital Problem List     Principal Problem:   SIRS (systemic inflammatory response syndrome) ([place]) Active Problems:   Coronary atherosclerosis   Type 2 diabetes mellitus (HCC)   Anemia of chronic disease   Hypovolemic shock (HCC)   Abnormal ECG   Acute renal failure (ARF) (HCC)   Hypotension   Syncope and collapse   Chronic kidney disease, stage 4 (severe) (HCC)   AKI (acute kidney injury) ([place])   History of ETOH abuse    Subjective   Events of yesterday noted. Patient still complains of cough, SOB. Never had chest pain. Notes feeling bloated with swelling in legs. Developed fever, worsening SOB, cough, fatigue and malaise yesterday.  Inpatient Medications    . allopurinol  100 mg Oral Daily  . aspirin  81 mg Oral Once  . atorvastatin  40 mg Oral q1800  . bisacodyl  10 mg Rectal Daily  . calcium acetate  667 mg Oral TID WC  . ceFEPime (MAXIPIME) IV  1 g Intravenous Q24H  . folic acid  1 mg Oral Daily  . furosemide  80 mg Intravenous Once  . gabapentin  600 mg Oral BID  . guaiFENesin  600 mg Oral BID  . heparin  5,000 Units Subcutaneous Q8H  . insulin aspart  0-15 Units Subcutaneous TID WC  . insulin aspart  0-5 Units Subcutaneous QHS  . insulin aspart  4 Units Subcutaneous TID WC  . insulin detemir  32 Units Subcutaneous QHS  . metronidazole  500 mg Intravenous Q6H  . multivitamin with minerals  1 tablet Oral Daily  . polyethylene glycol  17 g Oral Daily  . thiamine  100 mg Oral Daily   Or  . thiamine  100 mg Intravenous Daily  . vancomycin  1,000 mg Intravenous Q48H    Vital Signs    Vitals:   12/29/15 1114 12/29/15 1623 12/29/15 1715 12/29/15 1738  BP: (!) 172/82 (!) 143/73 (!) 160/69 (!) 151/72  Pulse: 65 67 76   Resp: (!) 21 18 (!) 24   Temp: 99.6 F (37.6 C) 98.6 F (37 C) 98.8 F (37.1 C) 98.5 F (36.9 C)  TempSrc: Oral Oral Oral Oral  SpO2: 100% 97% 99%   Weight:        Height:        Intake/Output Summary (Last 24 hours) at 12/29/15 1743 Last data filed at 12/29/15 1738  Gross per 24 hour  Intake           596.25 ml  Output             1175 ml  Net          -578.75 ml   Filed Weights   12/26/15 1800 12/29/15 0044 12/29/15 0456  Weight: 177 lb 8 oz (80.5 kg) 197 lb 8.5 oz (89.6 kg) 197 lb 0.1 oz (89.4 kg)    Physical Exam    General: Pleasant caucasian male, appears ill. Neuro: Alert and oriented X 3. Moves all extremities spontaneously. Psych: Normal affect. HEENT:  Normal  Neck: Supple without bruits or JVD. Lungs:  Bilateral coarse rhonchi. Diminished BS left base. Heart: RRR no s3, s4, or murmurs. Abdomen: Soft, non-tender, non-distended, BS + x 4.  Extremities: No clubbing, cyanosis or edema. DP/PT/Radials 2+ and equal bilaterally.  Labs    CBC  Recent Labs  12/28/15 0853 12/29/15 0544  WBC 21.6* 15.9*  NEUTROABS  --  12.3*  HGB 8.2* 7.8*  HCT 24.6* 23.9*  MCV 92.1 93.4  PLT 187 [id number]   Basic Metabolic Panel  Recent Labs  12/28/15 1038 12/29/15 0544  NA 130* 135  K 5.0 5.1  CL 105 111  CO2 17* 15*  GLUCOSE 210* 94  BUN 39* 37*  CREATININE 3.65* 3.34*  CALCIUM 7.6* 7.7*   Liver Function Tests  Recent Labs  12/27/15 0548 12/28/15 1038  AST 15 32  ALT 13* 14*  ALKPHOS 32* 41  BILITOT 0.3 0.3  PROT 4.8* 5.3*  ALBUMIN 2.6* 2.9*   No results for input(s): LIPASE, AMYLASE in the last 72 hours. Cardiac Enzymes  Recent Labs  12/26/15 2249  12/28/15 0119 12/28/15 2144 12/29/15 0544  CKTOTAL 94  --   --   --   --   TROPONINI  --   < > 0.25* 5.05* 4.44*  < > = values in this interval not displayed. BNP Invalid input(s): POCBNP D-Dimer No results for input(s): DDIMER in the last 72 hours. Hemoglobin A1C No results for input(s): HGBA1C in the last 72 hours. Fasting Lipid Panel No results for input(s): CHOL, HDL, LDLCALC, TRIG, CHOLHDL, LDLDIRECT in the last 72 hours. Thyroid Function Tests  Recent  Labs  12/26/15 2039  TSH 1.544    Telemetry    SR,  Did develop bradycardia with respiratory distress last night.   ECG    NSR with mild T inversion in leads 1, avl.  Radiology    TTE: 12/26/15  Study Conclusions  - Left ventricle: The cavity size was mildly dilated. There was   moderate concentric hypertrophy. Systolic function was normal.   The estimated ejection fraction was in the range of 55% to 60%.   Wall motion was normal; there were no regional wall motion   abnormalities. Features are consistent with a pseudonormal left   ventricular filling pattern, with concomitant abnormal relaxation   and increased filling pressure (grade 2 diastolic dysfunction). - Aortic valve: Trileaflet; mildly thickened, mildly calcified   leaflets. Valve area (VTI): 1.83 cm^2. Valve area (Vmax): 1.84   cm^2. Valve area (Vmean): 1.86 cm^2. - Left atrium: The atrium was moderately dilated.  Assessment & Plan    65 y.o. 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 CKD IV, HTN, DM, obesity, depression who presented to the [facility] ED after taking his morning medications and having a syncopal episode at the [facility].   1. Hypovolemic Shock ([place]): Received 6L of IV fluids on admission and breifly required the addi tion of levophed. By the following morning BP was normal. This was in part related to addition of lisinopril despite prior increase in creatinine from 2.4>>3.0. Creatinine 5 on admit.    2. Acute renal failure (ARF) ([place]): Has a known Hx of renal failure, reports being followed by a nephrologist in [place]. Cr improved today from 5.30>>3.92>>3.5. Still not back at baseline.  3. Type 2 diabetes mellitus ([place]): SSI    4. Elevated troponin. On admission patient had marked diffuse ST depression consistent with global ischemia. There was mild troponin elevation with peak 0.4 then declining. This was related to his hypotension. With  acute events of yesterday troponin increased to 5.05 without chest pain or dynamic Ecg changes. In this setting I think troponin elevation is due to demand ischemia in setting of fever, PNA, volume overload, and severe anemia. Also ARF. Will DC heparin with dropping Hgb.  5. LLL PNA. Possible aspiration given presentation with altered mental status.  6. Anemia. Being transfused now.  7. Volume overload. Patient is at least 6 liters positive since admission. Received copious IVF on admit. Now appears volume overloaded with increased edema, elevated BNP. Will give lasix 80 mg IV now. Monitor strict I/O and daily weight. May need additional diuretics tomorrow depending on response. Monitor renal function closely. EF normal by Echo.   8. CAD s/p CABG. At some point will need follow up ischemic evaluation  But I would avoid cardiac cath/contrast studies now with recent acute renal insult. This would carry a high risk for worsening renal failure and dialysis.  Will treat conservatively now. Consider addition of beta blocker tomorrow if no further bradycardia. On ASA and statin.  Signed,  Meleana Commerford Martinique, [place] 12/29/2015 5:43 PM

## 2015-12-29 NOTE — Progress Notes (Signed)
Family Medicine Teaching Service Daily Progress Note Intern Pager: [phone]  Patient name: Joseph Mcdonald Medical record number: [id number] Date of birth: 12-29-1950 Age: 65 y.o. Gender: male  Primary Care Provider: Mercy Riding, MD Consultants: Critical Care Code Status: Full  Pt Overview and Major Events to Date:  12/26/15: 6L IV Fluids, Levophed 12/27/15:continue to monitor fluid status and cardiology work up  12/28/15: Troponin 5.05, NSTEMI, transferred to [place] unit 12/29/15: Cardiology work up  Assessment and Plan: Joseph Phares McIntoshis a 65 y.o.malepresenting with altered mental status, hypotension. PMH is significant for alcohol abuse, CAD status post CABG in 2011 and hyperlipidemia, hypertension, type 2 diabetes, chronic kidney disease, chronic back pain, anxiety, depression  Sepsis: Resolving. Highly suspicious for HAP in LLL vs aspiration pneumonia. XRAY showed opacities in LLL Last fever was at 2100 on 10/5 of 100.8, has been afebrile this morning. Feels better this AM compared to yesterda. Procalcitonin: 11.36 today . Denied any rigors. Blood cultures ordered, awaiting collection.  Leukocytosis to 15.9 today, down from 21 yesterday.  - f/u fever curve -Respiratory panel pendng -Influenza panel pending - Daily CBC, BMP and blood cultures - Continue Vancomycin and Cefepime (Day 2)  Hypovolemic Shock: Resolved. Normotensive this morning and hemodynamically stable. - SLIV - Vitals per floor policy - Holding blood pressure medications for now  Lactic Acidosis: Resolved. Lactic acid 1.8. Likely from tissue hypoperfusion.   NSTEMI- Abnormal EKG with elevated Troponin: Initially,Troponin 0.25 from 0.4. Cardiology saw patient on 10/3 in the ED and thought this was likely due to global ischemia. Patient had episode of SOB and bradycardia last night, troponin was collected and notable at 5.05 (now 4.44). He was transferred to SDU. Heparin was started. Per  cards, will not cath until infectious process resolves. Cardiology following.  -Heparin ggt and ASA  -Cardiology consulted, appreciate recommendations -Will continue to cycle troponins -Continuous cardiac monitoring  -STAT EKG for any chest pain  Abdominal pain:  Present this AM, likely from constipation. - dulcolax suppository daily - Miralax  Acute on Chronic CKD: Suspect prerenal etiology in the setting of hypotension and volume depletion. Stage III Kidney disease at baseline; Creatinine today 3.34 down from 3.58 yesterday and 5.3 upon admission. Creatinine baseline 2.2-2.4 -Monitor daily BMP -Daily fluids  Type 2 diabetes:  Last HgA1c 8.3. Patient on levemir 75U at bedtime and novolog 30units 3x daily with meals.   CBG today was 60.  - Decrease to 32U levemir today - SSI - qACHS CBGs  Chronic back pain:   - continue oxycodone-acetaminophen 5-325mg  PRN - continue gabapentin 600mg  BID  Anxiety/depression: Stable - continue lexapro 10mg  daily - holding klonopin 0.5mg  PRN  Anemia:  Hgb 7.8 this morning. Unclear baseline hgb as he has a wide range from 9.1-12.1 based on Epic review. No signs of any bleeding at this time. Hgb may be low due to dilutional effect from receiving so much fluids. - Transfuse 1 u pRBCs - post transfusion H&H - Monitor for signs of bleeding  Hx of Hypertension:Home hydralazine 50 mg TID, amlodipine 5 mg BIDand lisinopril 20 mg daily. BP currently 135/58.  - holding BP medications given hypotension on presentation  FEN/GI: soft diet, D5 1/2 NS @125cc /hr PPx: SubQ Heparin  Disposition: SDU  Subjective:  Patient is laying in bed this morning. He says he did not sleep well last night because he kept waking up. He feels better today. He denies headache, lightheadedness, chest pain, shortness of breath, and abdominal pain. He is hungry this  morning and does not like that he is on a liquid diet. His last bowel movement was Tuesday at the  hospital.   Objective: Temp:  [98.4 F (36.9 C)-100.8 F (38.2 C)] 98.5 F (36.9 C) (10/06 0700) Pulse Rate:  [40-79] 56 (10/06 0700) Resp:  [10-24] 14 (10/06 0700) BP: (94-161)/(53-72) 126/72 (10/06 0700) SpO2:  [92 %-100 %] 98 % (10/06 0700) Weight:  [197 lb 0.1 oz (89.4 kg)-197 lb 8.5 oz (89.6 kg)] 197 lb 0.1 oz (89.4 kg) (10/06 0456) Physical Exam: General: No apparent distress Cardiovascular: RRR, s1/s2 present with no murmurs Respiratory: normal work of breathing, no wheezing Gastrointestinal: soft, distended, no tenderness MSK: no clubbing, cyanosis, 1+ LE pitting edema bilaterally below knee Derm: warm and dry Neuro: AAOx3 Psych: normal mood and affect  Laboratory:  Recent Labs Lab 12/27/15 0850 12/28/15 0853 12/29/15 0544  WBC 9.1 21.6* 15.9*  HGB 9.1* 8.2* 7.8*  HCT 27.1* 24.6* 23.9*  PLT 198 187 172    Recent Labs Lab 12/26/15 1022 12/27/15 0548 12/28/15 0853 12/28/15 1038 12/29/15 0544  NA 136 135 133* 130* 135  K 3.2* 4.4 4.6 5.0 5.1  CL 107 110 109 105 111  CO2 13* 17* 17* 17* 15*  BUN 69* 55* 37* 39* 37*  CREATININE 5.10* 3.92* 3.58* 3.65* 3.34*  CALCIUM 7.6* 7.1* 7.5* 7.6* 7.7*  PROT 5.5* 4.8*  --  5.3*  --   BILITOT 0.6 0.3  --  0.3  --   ALKPHOS 37* 32*  --  41  --   ALT 11* 13*  --  14*  --   AST 15 15  --  32  --   GLUCOSE 88 228* 146* 210* 94     Imaging/Diagnostic Tests: Ct Abdomen Pelvis Wo Contrast  Result Date: 12/26/2015 CLINICAL DATA:  Abdominal bloating, shortness of breath. Concern for sepsis. End-stage renal disease. EXAM: CT ABDOMEN AND PELVIS WITHOUT CONTRAST TECHNIQUE: Multidetector CT imaging of the abdomen and pelvis was performed following the standard protocol without IV contrast. COMPARISON:  None. FINDINGS: Lower chest: Left lower lobe opacities, likely atelectasis. Linear right base atelectasis. No effusions. Heart is mildly enlarged. Hepatobiliary: No focal hepatic abnormality. Gallbladder unremarkable. Pancreas: No  focal abnormality or ductal dilatation. Spleen: No focal abnormality.  Normal size. Adrenals/Urinary Tract: Scattered low-density and isodense areas within the kidneys bilaterally, shown on prior ultrasounds represent cysts. No hydronephrosis. Adrenal glands can't urinary bladder grossly unremarkable. Stomach/Bowel: Moderate stool burden in the rectosigmoid colon. Stomach is mildly distended with fluid/ food. Evidence of bowel obstruction. Vascular/Lymphatic: Aortic and iliac calcifications. No aneurysm. No adenopathy. Reproductive: Prostate mildly prominent. No visible focal abnormality. Other: No free fluid or free air.  No focal fluid collection. Musculoskeletal: No acute bony abnormality or focal bone lesion. IMPRESSION: Bibasilar opacities, left greater than right, likely atelectasis. Mild cardiomegaly, coronary artery disease, aortoiliac atherosclerosis. Mildly distended stomach.  No evidence of bowel obstruction. No acute findings in the abdomen or pelvis. Electronically Signed   By: Rolm Baptise M.D.   On: 12/26/2015 11:01   Dg Chest 2 View  Result Date: 12/28/2015 CLINICAL DATA:  Fever, shortness of breath. History of diabetes, acute on chronic renal failure, hypotension. EXAM: CHEST  2 VIEW COMPARISON:  Portable chest x-ray of December 26, 2015 FINDINGS: The lungs are well-expanded. The pulmonary interstitial edema has decreased. There is persistent increased interstitial density in the left mid and lower lung. Tiny pleural effusions blunt the costophrenic angles bilaterally. The cardiac silhouette is top-normal in  size. The pulmonary vascularity is not engorged. There are changes from previous CABG. There is calcification in the wall of the thoracic aorta. The bony thorax exhibits no acute abnormality. IMPRESSION: Improving pulmonary interstitial edema. Persistent atelectasis in the left mid and lower lung. Cardiomegaly without pulmonary vascular congestion. Small bilateral pleural effusions. Aortic  atherosclerosis. Electronically Signed   By: David  Martinique M.D.   On: 12/28/2015 07:53   Dg Chest Portable 1 View  Result Date: 12/26/2015 CLINICAL DATA:  65 year old male with history of hypotension and abdominal cramping for the past 2 days. EXAM: PORTABLE CHEST 1 VIEW COMPARISON:  Chest x-ray 01/03/2010. FINDINGS: Low lung volumes. No acute consolidative airspace disease. No pleural effusions. Mild cardiomegaly. Cephalization of the pulmonary vasculature with indistinctness of the interstitial markings, suggestive of underlying interstitial pulmonary edema. Aortic atherosclerosis. Status post median sternotomy for CABG. Transcutaneous defibrillator pads are seen projecting over the lower left hemithorax. IMPRESSION: 1. The appearance the chest suggests mild congestive heart failure. 2. Low lung volumes. 3. Aortic atherosclerosis. Electronically Signed   By: Vinnie Langton M.D.   On: 12/26/2015 11:09    Farrel Conners, Medical Student 12/29/2015, 9:13 AM  [place] Intern pager: [phone], text pages welcome  I have separately seen and examined the patient. I have discussed the findings and exam with student doctor Farrel Conners and agree with the above note.  My changes/additions are outlined in BLUE.   Smitty Cords, MD [place], PGY-2

## 2015-12-29 NOTE — Progress Notes (Signed)
FPTS Interim Progress Note  Patient seen by me at 5.30 am.  RN paged to inform bradycardia of 38, and wheezing.  Other VSS.  Patient denies chest pain at this time. EKG obtained.  Discussed with cardiology.  Will continue to monitor.   Lovenia Kim, MD 12/29/2015, 5:39 AM PGY-1, [place] Medicine Service pager [phone]

## 2015-12-29 NOTE — Progress Notes (Signed)
[facility] for Heparin Indication: NSTEMI  Allergies  Allergen Reactions  . Penicillins Hives and Itching    Has patient had a PCN reaction causing immediate rash, facial/tongue/throat swelling, SOB or lightheadedness with hypotension: Yes Has patient had a PCN reaction causing severe rash involving mucus membranes or skin necrosis: Yes Has patient had a PCN reaction that required hospitalization No Has patient had a PCN reaction occurring within the last 10 years: No If all of the above answers are "NO", then may proceed with Cephalosporin use.     Patient Measurements: Height: 5\' 4"  (162.6 cm) Weight: 197 lb 0.1 oz (89.4 kg) IBW/kg (Calculated) : 59.2 Heparin Dosing Weight: 75 kg  Vital Signs: Temp: 98.5 F (36.9 C) (10/06 0700) Temp Source: Oral (10/06 0700) BP: 126/72 (10/06 0700) Pulse Rate: 56 (10/06 0700)  Labs:  Recent Labs  12/26/15 2249  12/27/15 0850  12/28/15 0119 12/28/15 0853 12/28/15 1038 12/28/15 2144 12/29/15 0544 12/29/15 0757  HGB  --   < > 9.1*  --   --  8.2*  --   --  7.8*  --   HCT  --   --  27.1*  --   --  24.6*  --   --  23.9*  --   PLT  --   --  198  --   --  187  --   --  172  --   APTT  --   --   --   --   --   --  39*  --   --   --   LABPROT  --   --   --   --   --   --  14.9  --   --   --   INR  --   --   --   --   --   --  1.17  --   --   --   HEPARINUNFRC  --   --   --   --   --   --   --   --   --  0.19*  CREATININE  --   < >  --   --   --  3.58* 3.65*  --  3.34*  --   CKTOTAL 94  --   --   --   --   --   --   --   --   --   TROPONINI  --   --   --   < > 0.25*  --   --  5.05* 4.44*  --   < > = values in this interval not displayed.  Estimated Creatinine Clearance: 21.9 mL/min (by C-G formula based on SCr of 3.34 mg/dL (H)).   Medical History: Past Medical History:  Diagnosis Date  . Anxiety   . Depression   . Diabetes mellitus without complication ([place])   . Hypertension   . Peripheral  neuropathy ([facility])     Medications:  . sodium chloride 10 mL/hr at 12/28/15 2205  . heparin 1,250 Units/hr (12/29/15 [id number])     Assessment: 65 y.o. M presents with AMS and hypotension. Known to pharmacy from abx dosing. Pt now with elevated trop to 5.05 and to begin heparin for NSTEMI.  Initial heparin level below goal this morning.  Per RN, no issue with IV infusion.  Hgb decreasing, but no overt bleeding noted.   Goal of Therapy:  Heparin level 0.3-0.7 units/ml Monitor  platelets by anticoagulation protocol: Yes   Plan:  Increase IV heparin to 1250 units/hr. Recheck heparin level in 6 hrs. Daily heparin level and CBC F/u plans for further cardiac workup.  Uvaldo Rising, BCPS  Clinical Pharmacist Pager [phone]  12/29/2015 10:38 AM

## 2015-12-30 ENCOUNTER — Encounter (HOSPITAL_COMMUNITY): Payer: Self-pay | Admitting: *Deleted

## 2015-12-30 ENCOUNTER — Inpatient Hospital Stay (HOSPITAL_COMMUNITY): Payer: Medicare Other

## 2015-12-30 DIAGNOSIS — R9431 Abnormal electrocardiogram [ECG] [EKG]: Secondary | ICD-10-CM

## 2015-12-30 LAB — BASIC METABOLIC PANEL
ANION GAP: 6 (ref 5–15)
Anion gap: 8 (ref 5–15)
BUN: 40 mg/dL — ABNORMAL HIGH (ref 6–20)
BUN: 40 mg/dL — AB (ref 6–20)
CHLORIDE: 109 mmol/L (ref 101–111)
CO2: 20 mmol/L — AB (ref 22–32)
CO2: 22 mmol/L (ref 22–32)
CREATININE: 3.36 mg/dL — AB (ref 0.61–1.24)
CREATININE: 3.3 mg/dL — AB (ref 0.61–1.24)
Calcium: 8 mg/dL — ABNORMAL LOW (ref 8.9–10.3)
Calcium: 8.1 mg/dL — ABNORMAL LOW (ref 8.9–10.3)
Chloride: 107 mmol/L (ref 101–111)
GFR calc Af Amer: 21 mL/min — ABNORMAL LOW (ref >60)
GFR calc non Af Amer: 18 mL/min — ABNORMAL LOW (ref >60)
GFR, EST AFRICAN AMERICAN: 20 mL/min — AB (ref >60)
GFR, EST NON AFRICAN AMERICAN: 18 mL/min — AB (ref >60)
Glucose, Bld: 221 mg/dL — ABNORMAL HIGH (ref 65–99)
Glucose, Bld: 163 mg/dL — ABNORMAL HIGH (ref 65–99)
POTASSIUM: 5.3 mmol/L — AB (ref 3.5–5.1)
Potassium: 4.8 mmol/L (ref 3.5–5.1)
SODIUM: 137 mmol/L (ref 135–145)
Sodium: 135 mmol/L (ref 135–145)

## 2015-12-30 LAB — URINE CULTURE

## 2015-12-30 LAB — TROPONIN I
Troponin I: 2.86 ng/mL (ref <0.03)
Troponin I: 3.11 ng/mL (ref <0.03)

## 2015-12-30 LAB — TYPE AND SCREEN
ABO/RH(D): A POS
ANTIBODY SCREEN: NEGATIVE
Unit division: 0

## 2015-12-30 LAB — GLUCOSE, CAPILLARY
GLUCOSE-CAPILLARY: 189 mg/dL — AB (ref 65–99)
GLUCOSE-CAPILLARY: 181 mg/dL — AB (ref 65–99)
GLUCOSE-CAPILLARY: 159 mg/dL — AB (ref 65–99)
Glucose-Capillary: 289 mg/dL — ABNORMAL HIGH (ref 65–99)

## 2015-12-30 LAB — CBC
HEMATOCRIT: 27.3 % — AB (ref 39.0–52.0)
HEMOGLOBIN: 8.8 g/dL — AB (ref 13.0–17.0)
MCH: 29.9 pg (ref 26.0–34.0)
MCHC: 32.2 g/dL (ref 30.0–36.0)
MCV: 92.9 fL (ref 78.0–100.0)
Platelets: 200 10*3/uL (ref 150–400)
RBC: 2.94 MIL/uL — AB (ref 4.22–5.81)
RDW: 13.5 % (ref 11.5–15.5)
WBC: 14.6 10*3/uL — ABNORMAL HIGH (ref 4.0–10.5)

## 2015-12-30 MED ORDER — INSULIN DETEMIR 100 UNIT/ML ~~LOC~~ SOLN
34.0000 [IU] | Freq: Every day | SUBCUTANEOUS | Status: DC
  Administered 2015-12-30: 34 [IU] via SUBCUTANEOUS
  Filled 2015-12-30 (×2): qty 0.34

## 2015-12-30 MED ORDER — INSULIN ASPART 100 UNIT/ML ~~LOC~~ SOLN
0.0000 [IU] | Freq: Three times a day (TID) | SUBCUTANEOUS | Status: DC
  Administered 2015-12-31: 8 [IU] via SUBCUTANEOUS
  Administered 2015-12-31: 5 [IU] via SUBCUTANEOUS
  Administered 2015-12-31: 2 [IU] via SUBCUTANEOUS
  Administered 2016-01-01: 3 [IU] via SUBCUTANEOUS
  Administered 2016-01-01: 11 [IU] via SUBCUTANEOUS
  Administered 2016-01-01: 3 [IU] via SUBCUTANEOUS
  Administered 2016-01-02: 5 [IU] via SUBCUTANEOUS
  Administered 2016-01-02: 3 [IU] via SUBCUTANEOUS

## 2015-12-30 MED ORDER — AMLODIPINE BESYLATE 5 MG PO TABS
5.0000 mg | ORAL_TABLET | Freq: Every day | ORAL | Status: DC
  Administered 2015-12-30 – 2015-12-31 (×2): 5 mg via ORAL
  Filled 2015-12-30 (×2): qty 1

## 2015-12-30 MED ORDER — SODIUM POLYSTYRENE SULFONATE 15 GM/60ML PO SUSP
30.0000 g | Freq: Once | ORAL | Status: AC
  Administered 2015-12-30: 30 g via ORAL
  Filled 2015-12-30: qty 120

## 2015-12-30 MED ORDER — GABAPENTIN 600 MG PO TABS
300.0000 mg | ORAL_TABLET | Freq: Two times a day (BID) | ORAL | Status: DC
  Administered 2015-12-30 – 2016-01-02 (×5): 300 mg via ORAL
  Filled 2015-12-30 (×5): qty 1

## 2015-12-30 NOTE — Progress Notes (Signed)
Family Medicine Teaching Service Daily Progress Note Intern Pager: [phone]  Patient name: Joseph Mcdonald Medical record number: [id number] Date of birth: Jan 06, 1951 Age: 65 y.o. Gender: male  Primary Care Provider: Mercy Riding, MD Consultants: Critical Care Code Status: Full  Pt Overview and Major Events to Date:  12/26/15: 6L IV Fluids, Levophed 12/27/15:continue to monitor fluid status and cardiology work up  12/28/15: Troponin 5.05, NSTEMI, transferred to [facility] unit 12/29/15: Cardiology work up 10/7: Continue with IV antibiotics  Assessment and Plan: Joseph Henao McIntoshis a 65 y.o.malepresenting with altered mental status, hypotension. PMH is significant for alcohol abuse, CAD status post CABG in 2011 and hyperlipidemia, hypertension, type 2 diabetes, chronic kidney disease, chronic back pain, anxiety, depression  Sepsis: Mostly resolved except for leukocytosis. Highly suspicious for HAP in LLL vs aspiration pneumonia. XRAY showed opacities in LLL. Afebrile >24 hours. Vitals stable.  Leukocytosis down trending. RVP negative. Limited abdominal U/S performed to assess for acites, possible SBP?Marland Kitchen Abdominal ultrasound showed steatosis but was otherwise negative. Urine culture showing insignificant growth. Blood culture no growth 1 day. - Vital signs per floor protocol - Monitor for fever  - Daily CBC, BMP and blood cultures - Continue Vancomycin and Cefepime (Day 3) - Continue Flagyl (Day 2) -Blood cultures pending  Hypovolemic Shock: Resolved. Normotensive this morning and hemodynamically stable. - SLIV - Vitals per floor policy - Holding blood pressure medications for now  Anemia:  Unclear baseline hgb as he has a wide range from 9.1-12.1 based on Epic review. No signs of any bleeding at this time. Hgb may be low due to dilutional effect from receiving so much fluids. S/p 1 u pRBCs on 10/7, hemoglobin stable now to 8.8 - Monitor for signs of bleeding - Daily  CBC - Hgb transfusion threshold of 8  NSTEMI- Abnormal EKG (diffuse ST Depression) with elevated Troponin:Cardiology saw patient on 10/3 in the ED and thought this was likely due to global ischemia. Patient had episode of SOB and bradycardia  On 10/5, troponin was collected and notable at 5.05, he was transferred to SDU. Heparin was started. Per cards, will not cath until infectious process resolves. Cardiology following and thinks elevated troponins still from demand ischemia and ARF. Troponin now 2.86. Heparin ggt stopped due to decreasing hemoglobin  -Heparin at DVT prophylaxis dose and ASA  -Cardiology consulted, appreciate recommendations -Continuous cardiac monitoring  -STAT EKG for any chest pain  Lactic Acidosis: Resolved. Likely from tissue hypoperfusion.   Abdominal pain:  Likely from constipation. Had a bowel movement last night. Is still distended but nontender.  - dulcolax suppository daily - Miralax  Acute on Chronic CKD: Suspect prerenal etiology in the setting of hypotension and volume depletion. Stage III Kidney disease at baseline; Creatinine today 3.36. Creatinine baseline 2.2-2.4. Fluids stopped due to volume overload, was given 80 mg Lasix IV and had 3.5 L of fluid output yesterday.  Now only 3.8 L up. -Monitor daily BMP  Type 2 diabetes:  Last HgA1c 8.3. Patient on levemir 75U at bedtime and novolog 30units 3x daily with meals.  CBGs slightly elevated  -Increase to 34U levemir daily - SSI - qACHS CBGs  Chronic back pain:   - continue oxycodone-acetaminophen 5-325mg  PRN - continue gabapentin 600mg  BID  Anxiety/depression: Stable - continue lexapro 10mg  daily - holding klonopin 0.5mg  PRN  Hx of Hypertension:Home hydralazine 50 mg TID, amlodipine 5 mg BIDand lisinopril 20 mg daily. BP currently 135/58.  - holding BP medications given hypotension on presentation  FEN/GI:  regular diet, SLIV PPx: SubQ Heparin  Disposition: SDU  Subjective:  No acute  events overnight, patient remained on room air and without any shortness of breath or chest pain. This morning patient states that he is feeling much better from yesterday. He had a bowel movement last night. He feels like the antibiotics have helped him a lot. No complaints. Denies any chest pain, shortness of breath, palpitations, leg pain, abdominal pain.  Objective: Temp:  [98.5 F (36.9 C)-99.6 F (37.6 C)] 99.4 F (37.4 C) (10/07 0359) Pulse Rate:  [56-79] 56 (10/07 0500) Resp:  [16-25] 20 (10/07 0500) BP: (96-172)/(51-82) 150/63 (10/07 0500) SpO2:  [92 %-100 %] 96 % (10/07 0500) Weight:  [195 lb 1.7 oz (88.5 kg)] 195 lb 1.7 oz (88.5 kg) (10/07 0359) Physical Exam: General: Sitting up in bed eating breakfast, in no acute distress Cardiovascular: RRR, s1/s2 present with no murmurs Respiratory: normal work of breathing, no wheezing Gastrointestinal: soft, distended, no tenderness, normal bowel sounds MSK: Trace edema in bilateral ankles Derm: warm and dry Neuro: AAOx3 Psych: normal mood and affect  Laboratory:  Recent Labs Lab 12/28/15 0853 12/29/15 0544 12/29/15 2156 12/30/15 0254  WBC 21.6* 15.9*  --  14.6*  HGB 8.2* 7.8* 8.9* 8.8*  HCT 24.6* 23.9* 27.3* 27.3*  PLT 187 172  --  200    Recent Labs Lab 12/26/15 1022 12/27/15 0548  12/28/15 1038 12/29/15 0544 12/30/15 0254  NA 136 135  < > 130* 135 135  K 3.2* 4.4  < > 5.0 5.1 5.3*  CL 107 110  < > 105 111 109  CO2 13* 17*  < > 17* 15* 20*  BUN 69* 55*  < > 39* 37* 40*  CREATININE 5.10* 3.92*  < > 3.65* 3.34* 3.36*  CALCIUM 7.6* 7.1*  < > 7.6* 7.7* 8.0*  PROT 5.5* 4.8*  --  5.3*  --   --   BILITOT 0.6 0.3  --  0.3  --   --   ALKPHOS 37* 32*  --  41  --   --   ALT 11* 13*  --  14*  --   --   AST 15 15  --  32  --   --   GLUCOSE 88 228*  < > 210* 94 221*  < > = values in this interval not displayed.   Imaging/Diagnostic Tests: Ct Abdomen Pelvis Wo Contrast  Result Date: 12/26/2015 CLINICAL DATA:   Abdominal bloating, shortness of breath. Concern for sepsis. End-stage renal disease. EXAM: CT ABDOMEN AND PELVIS WITHOUT CONTRAST TECHNIQUE: Multidetector CT imaging of the abdomen and pelvis was performed following the standard protocol without IV contrast. COMPARISON:  None. FINDINGS: Lower chest: Left lower lobe opacities, likely atelectasis. Linear right base atelectasis. No effusions. Heart is mildly enlarged. Hepatobiliary: No focal hepatic abnormality. Gallbladder unremarkable. Pancreas: No focal abnormality or ductal dilatation. Spleen: No focal abnormality.  Normal size. Adrenals/Urinary Tract: Scattered low-density and isodense areas within the kidneys bilaterally, shown on prior ultrasounds represent cysts. No hydronephrosis. Adrenal glands can't urinary bladder grossly unremarkable. Stomach/Bowel: Moderate stool burden in the rectosigmoid colon. Stomach is mildly distended with fluid/ food. Evidence of bowel obstruction. Vascular/Lymphatic: Aortic and iliac calcifications. No aneurysm. No adenopathy. Reproductive: Prostate mildly prominent. No visible focal abnormality. Other: No free fluid or free air.  No focal fluid collection. Musculoskeletal: No acute bony abnormality or focal bone lesion. IMPRESSION: Bibasilar opacities, left greater than right, likely atelectasis. Mild cardiomegaly, coronary artery disease, aortoiliac atherosclerosis.  Mildly distended stomach.  No evidence of bowel obstruction. No acute findings in the abdomen or pelvis. Electronically Signed   By: Rolm Baptise M.D.   On: 12/26/2015 11:01   Dg Chest 2 View  Result Date: 12/28/2015 CLINICAL DATA:  Fever, shortness of breath. History of diabetes, acute on chronic renal failure, hypotension. EXAM: CHEST  2 VIEW COMPARISON:  Portable chest x-ray of December 26, 2015 FINDINGS: The lungs are well-expanded. The pulmonary interstitial edema has decreased. There is persistent increased interstitial density in the left mid and lower  lung. Tiny pleural effusions blunt the costophrenic angles bilaterally. The cardiac silhouette is top-normal in size. The pulmonary vascularity is not engorged. There are changes from previous CABG. There is calcification in the wall of the thoracic aorta. The bony thorax exhibits no acute abnormality. IMPRESSION: Improving pulmonary interstitial edema. Persistent atelectasis in the left mid and lower lung. Cardiomegaly without pulmonary vascular congestion. Small bilateral pleural effusions. Aortic atherosclerosis. Electronically Signed   By: David  Martinique M.D.   On: 12/28/2015 07:53   Dg Chest Port 1 View  Result Date: 12/29/2015 CLINICAL DATA:  Pneumonia.  Cough. EXAM: PORTABLE CHEST 1 VIEW COMPARISON:  12/28/2015 FINDINGS: Mild cardiac enlargement is identified. Small left pleural effusion is again noted. Left mid lung and left lower lobe platelike atelectasis is similar to previous exam. No new findings. IMPRESSION: 1. Small left effusion. 2. Persistent left midlung and left lower lobe atelectasis. Electronically Signed   By: Kerby Moors M.D.   On: 12/29/2015 10:22   Dg Chest Portable 1 View  Result Date: 12/26/2015 CLINICAL DATA:  65 year old male with history of hypotension and abdominal cramping for the past 2 days. EXAM: PORTABLE CHEST 1 VIEW COMPARISON:  Chest x-ray 01/03/2010. FINDINGS: Low lung volumes. No acute consolidative airspace disease. No pleural effusions. Mild cardiomegaly. Cephalization of the pulmonary vasculature with indistinctness of the interstitial markings, suggestive of underlying interstitial pulmonary edema. Aortic atherosclerosis. Status post median sternotomy for CABG. Transcutaneous defibrillator pads are seen projecting over the lower left hemithorax. IMPRESSION: 1. The appearance the chest suggests mild congestive heart failure. 2. Low lung volumes. 3. Aortic atherosclerosis. Electronically Signed   By: Vinnie Langton M.D.   On: 12/26/2015 11:09    Carlyle Dolly, MD 12/30/2015, 7:21 AM  [place] Intern pager: [phone], text pages welcome

## 2015-12-30 NOTE — Progress Notes (Addendum)
Subjective: No CP  Breahting is OK Objective: Vitals:   12/30/15 0800 12/30/15 0900 12/30/15 1000 12/30/15 1105  BP: (!) 146/64 136/65 (!) 194/60 (!) 150/69  Pulse:      Resp:      Temp:      TempSrc:      SpO2:      Weight:      Height:       Weight change: -2 lb 6.8 oz (-1.1 kg)  Intake/Output Summary (Last 24 hours) at 12/30/15 1202 Last data filed at 12/30/15 1000  Gross per 24 hour  Intake             1504 ml  Output             4550 ml  Net            -3046 ml   I/O  + 3.3 L    General: Alert, awake, oriented x3, in no acute distress Neck:  JVP is normal Heart: Regular rate and rhythm, without murmurs, rubs, gallops.  Lungs: Clear to auscultation.  No rales or wheezes. Exemities:  No edema.   Neuro: Grossly intact, nonfocal.  Tele  SB/SR    Lab Results: Results for orders placed or performed during the hospital encounter of 12/26/15 (from the past 24 hour(s))  Heparin level (unfractionated)     Status: Abnormal   Collection Time: 12/29/15  3:55 PM  Result Value Ref Range   Heparin Unfractionated 0.12 (L) 0.30 - 0.70 IU/mL  Glucose, capillary     Status: Abnormal   Collection Time: 12/29/15  4:21 PM  Result Value Ref Range   Glucose-Capillary 143 (H) 65 - 99 mg/dL   Comment 1 Capillary Specimen   Glucose, capillary     Status: Abnormal   Collection Time: 12/29/15  9:28 PM  Result Value Ref Range   Glucose-Capillary 134 (H) 65 - 99 mg/dL   Comment 1 Capillary Specimen   Hemoglobin and hematocrit, blood     Status: Abnormal   Collection Time: 12/29/15  9:56 PM  Result Value Ref Range   Hemoglobin 8.9 (L) 13.0 - 17.0 g/dL   HCT 27.3 (L) 39.0 - 52.0 %  Troponin I (q 6hr x 3)     Status: Abnormal   Collection Time: 12/29/15 10:03 PM  Result Value Ref Range   Troponin I 2.78 (HH) <0.03 ng/mL  Basic metabolic panel     Status: Abnormal   Collection Time: 12/30/15  2:54 AM  Result Value Ref Range   Sodium 135 135 - 145 mmol/L   Potassium 5.3 (H) 3.5 -  5.1 mmol/L   Chloride 109 101 - 111 mmol/L   CO2 20 (L) 22 - 32 mmol/L   Glucose, Bld 221 (H) 65 - 99 mg/dL   BUN 40 (H) 6 - 20 mg/dL   Creatinine, Ser 3.36 (H) 0.61 - 1.24 mg/dL   Calcium 8.0 (L) 8.9 - 10.3 mg/dL   GFR calc non Af Amer 18 (L) >60 mL/min   GFR calc Af Amer 20 (L) >60 mL/min   Anion gap 6 5 - 15  CBC     Status: Abnormal   Collection Time: 12/30/15  2:54 AM  Result Value Ref Range   WBC 14.6 (H) 4.0 - 10.5 K/uL   RBC 2.94 (L) 4.22 - 5.81 MIL/uL   Hemoglobin 8.8 (L) 13.0 - 17.0 g/dL   HCT 27.3 (L) 39.0 - 52.0 %   MCV 92.9 78.0 - 100.0  fL   MCH 29.9 26.0 - 34.0 pg   MCHC 32.2 30.0 - 36.0 g/dL   RDW 13.5 11.5 - 15.5 %   Platelets 200 150 - 400 K/uL  Troponin I (q 6hr x 3)     Status: Abnormal   Collection Time: 12/30/15  2:54 AM  Result Value Ref Range   Troponin I 2.86 (HH) <0.03 ng/mL  Glucose, capillary     Status: Abnormal   Collection Time: 12/30/15  7:21 AM  Result Value Ref Range   Glucose-Capillary 189 (H) 65 - 99 mg/dL   Comment 1 Notify RN   Troponin I (q 6hr x 3)     Status: Abnormal   Collection Time: 12/30/15  9:07 AM  Result Value Ref Range   Troponin I 3.11 (HH) <0.03 ng/mL  Glucose, capillary     Status: Abnormal   Collection Time: 12/30/15 11:21 AM  Result Value Ref Range   Glucose-Capillary 181 (H) 65 - 99 mg/dL   Comment 1 Capillary Specimen     Studies/Results: US Abdomen Limited  Result Date: 12/30/2015 CLINICAL DATA:  Abdominal pain, ascites.  Alcohol abuse. EXAM: US ABDOMEN LIMITED - RIGHT UPPER QUADRANT COMPARISON:  CT abdomen and pelvis 12/26/2015. FINDINGS: Gallbladder: No gallstones or wall thickening visualized. No sonographic Murphy sign noted by sonographer. Common bile duct: Diameter: Normal measuring 4 mm. Liver: Increased echogenicity consistent with steatosis.  No focal lesion. IMPRESSION: Steatosis, otherwise negative. Electronically Signed   By: Staci Righter M.D.   On: 12/30/2015 10:48    Medications: {=REviewed     @PROBHOSP @  1  NSTEMI  Patient denies any CP  May be related to demand ischemia with infection   No intervention for now beyond midical Rx until other medical issues clear since he is comfortable  Trop difficult to interpret in setting of renal funciont   Should have relook cath at some point  Increased risk with renal dysfunciton   2  HTN  WOuld Rx with amlodipine 5 qd  3  ANemia  Stabel  4  CKD  Cr 3.36 today      LOS: 3 days   Dorris Carnes 12/30/2015, 12:02 PM

## 2015-12-30 NOTE — Progress Notes (Signed)
Patient transported via bed to 3E11, all personal belongings sent with patient. Flagyl doses sent also. Wife called by patient to report transfer notice and new room number. Transfer tolerated well. Report called to [place]. Central monitoring notified.

## 2015-12-31 DIAGNOSIS — J189 Pneumonia, unspecified organism: Secondary | ICD-10-CM

## 2015-12-31 LAB — BASIC METABOLIC PANEL
Anion gap: 8 (ref 5–15)
BUN: 42 mg/dL — AB (ref 6–20)
CO2: 22 mmol/L (ref 22–32)
CREATININE: 3.33 mg/dL — AB (ref 0.61–1.24)
Calcium: 8 mg/dL — ABNORMAL LOW (ref 8.9–10.3)
Chloride: 106 mmol/L (ref 101–111)
GFR calc Af Amer: 21 mL/min — ABNORMAL LOW (ref >60)
GFR, EST NON AFRICAN AMERICAN: 18 mL/min — AB (ref >60)
GLUCOSE: 247 mg/dL — AB (ref 65–99)
POTASSIUM: 4.7 mmol/L (ref 3.5–5.1)
Sodium: 136 mmol/L (ref 135–145)

## 2015-12-31 LAB — CBC
HEMATOCRIT: 27.7 % — AB (ref 39.0–52.0)
Hemoglobin: 9 g/dL — ABNORMAL LOW (ref 13.0–17.0)
MCH: 30.2 pg (ref 26.0–34.0)
MCHC: 32.5 g/dL (ref 30.0–36.0)
MCV: 93 fL (ref 78.0–100.0)
Platelets: 221 10*3/uL (ref 150–400)
RBC: 2.98 MIL/uL — ABNORMAL LOW (ref 4.22–5.81)
RDW: 13.4 % (ref 11.5–15.5)
WBC: 11.2 10*3/uL — ABNORMAL HIGH (ref 4.0–10.5)

## 2015-12-31 LAB — GLUCOSE, CAPILLARY
GLUCOSE-CAPILLARY: 148 mg/dL — AB (ref 65–99)
GLUCOSE-CAPILLARY: 277 mg/dL — AB (ref 65–99)
Glucose-Capillary: 214 mg/dL — ABNORMAL HIGH (ref 65–99)
Glucose-Capillary: 254 mg/dL — ABNORMAL HIGH (ref 65–99)

## 2015-12-31 LAB — CULTURE, BLOOD (ROUTINE X 2)
CULTURE: NO GROWTH
CULTURE: NO GROWTH

## 2015-12-31 LAB — TROPONIN I: TROPONIN I: 1.63 ng/mL — AB (ref <0.03)

## 2015-12-31 MED ORDER — LEVOFLOXACIN 750 MG PO TABS: 750.0000 mg | ORAL_TABLET | ORAL | Status: DC

## 2015-12-31 MED ORDER — AMLODIPINE BESYLATE 5 MG PO TABS
5.0000 mg | ORAL_TABLET | Freq: Two times a day (BID) | ORAL | Status: DC
  Administered 2015-12-31 – 2016-01-01 (×3): 5 mg via ORAL
  Filled 2015-12-31 (×4): qty 1

## 2015-12-31 MED ORDER — INSULIN DETEMIR 100 UNIT/ML ~~LOC~~ SOLN
36.0000 [IU] | Freq: Every day | SUBCUTANEOUS | Status: DC
  Administered 2015-12-31 – 2016-01-01 (×2): 36 [IU] via SUBCUTANEOUS
  Filled 2015-12-31 (×3): qty 0.36

## 2015-12-31 MED ORDER — METRONIDAZOLE 500 MG PO TABS
500.0000 mg | ORAL_TABLET | Freq: Four times a day (QID) | ORAL | Status: DC
  Administered 2015-12-31 – 2016-01-02 (×8): 500 mg via ORAL
  Filled 2015-12-31 (×8): qty 1

## 2015-12-31 NOTE — Progress Notes (Signed)
Subjective:No CP  NO SOB   Objective: Vitals:   12/30/15 2000 12/30/15 2329 12/31/15 0519 12/31/15 0758  BP: (!) 151/69 (!) 168/69 (!) 157/66 (!) 182/60  Pulse: (!) 55 (!) 53 (!) 51 (!) 102  Resp: (!) 21 20 20 20   Temp: 99 F (37.2 C) 98.4 F (36.9 C) 99.1 F (37.3 C) 98.9 F (37.2 C)  TempSrc: Oral Oral Oral Oral  SpO2: 95% 96% 95% 94%  Weight:  184 lb 1.6 oz (83.5 kg) 185 lb 11.2 oz (84.2 kg)   Height:  5\' 6"  (1.676 m)     Weight change: -11 lb 0.1 oz (-4.993 kg)  Intake/Output Summary (Last 24 hours) at 12/31/15 0844 Last data filed at 12/31/15 0824  Gross per 24 hour  Intake             2130 ml  Output             4000 ml  Net            -1870 ml    General: Alert, awake, oriented x3, in no acute distress Neck:  JVP is normal Heart: Regular rate and rhythm, without murmurs, rubs, gallops.  Lungs: Clear to auscultation.  Mild wheeze on R    Exemities:  No edema.   Neuro: Grossly intact, nonfocal.  Tele  SB    Lab Results: Results for orders placed or performed during the hospital encounter of 12/26/15 (from the past 24 hour(s))  Troponin I (q 6hr x 3)     Status: Abnormal   Collection Time: 12/30/15  9:07 AM  Result Value Ref Range   Troponin I 3.11 (HH) <0.03 ng/mL  Glucose, capillary     Status: Abnormal   Collection Time: 12/30/15 11:21 AM  Result Value Ref Range   Glucose-Capillary 181 (H) 65 - 99 mg/dL   Comment 1 Capillary Specimen   Glucose, capillary     Status: Abnormal   Collection Time: 12/30/15  4:17 PM  Result Value Ref Range   Glucose-Capillary 159 (H) 65 - 99 mg/dL   Comment 1 Capillary Specimen   Basic metabolic panel     Status: Abnormal   Collection Time: 12/30/15  4:23 PM  Result Value Ref Range   Sodium 137 135 - 145 mmol/L   Potassium 4.8 3.5 - 5.1 mmol/L   Chloride 107 101 - 111 mmol/L   CO2 22 22 - 32 mmol/L   Glucose, Bld 163 (H) 65 - 99 mg/dL   BUN 40 (H) 6 - 20 mg/dL   Creatinine, Ser 3.30 (H) 0.61 - 1.24 mg/dL   Calcium  8.1 (L) 8.9 - 10.3 mg/dL   GFR calc non Af Amer 18 (L) >60 mL/min   GFR calc Af Amer 21 (L) >60 mL/min   Anion gap 8 5 - 15  Glucose, capillary     Status: Abnormal   Collection Time: 12/30/15  9:57 PM  Result Value Ref Range   Glucose-Capillary 289 (H) 65 - 99 mg/dL   Comment 1 Capillary Specimen    Comment 2 Notify RN   Basic metabolic panel     Status: Abnormal   Collection Time: 12/31/15  2:47 AM  Result Value Ref Range   Sodium 136 135 - 145 mmol/L   Potassium 4.7 3.5 - 5.1 mmol/L   Chloride 106 101 - 111 mmol/L   CO2 22 22 - 32 mmol/L   Glucose, Bld 247 (H) 65 - 99 mg/dL   BUN 42 (  H) 6 - 20 mg/dL   Creatinine, Ser 3.33 (H) 0.61 - 1.24 mg/dL   Calcium 8.0 (L) 8.9 - 10.3 mg/dL   GFR calc non Af Amer 18 (L) >60 mL/min   GFR calc Af Amer 21 (L) >60 mL/min   Anion gap 8 5 - 15  CBC     Status: Abnormal   Collection Time: 12/31/15  2:47 AM  Result Value Ref Range   WBC 11.2 (H) 4.0 - 10.5 K/uL   RBC 2.98 (L) 4.22 - 5.81 MIL/uL   Hemoglobin 9.0 (L) 13.0 - 17.0 g/dL   HCT 27.7 (L) 39.0 - 52.0 %   MCV 93.0 78.0 - 100.0 fL   MCH 30.2 26.0 - 34.0 pg   MCHC 32.5 30.0 - 36.0 g/dL   RDW 13.4 11.5 - 15.5 %   Platelets 221 150 - 400 K/uL  Glucose, capillary     Status: Abnormal   Collection Time: 12/31/15  6:24 AM  Result Value Ref Range   Glucose-Capillary 214 (H) 65 - 99 mg/dL    Studies/Results: US Abdomen Limited  Result Date: 12/30/2015 CLINICAL DATA:  Abdominal pain, ascites.  Alcohol abuse. EXAM: US ABDOMEN LIMITED - RIGHT UPPER QUADRANT COMPARISON:  CT abdomen and pelvis 12/26/2015. FINDINGS: Gallbladder: No gallstones or wall thickening visualized. No sonographic Murphy sign noted by sonographer. Common bile duct: Diameter: Normal measuring 4 mm. Liver: Increased echogenicity consistent with steatosis.  No focal lesion. IMPRESSION: Steatosis, otherwise negative. Electronically Signed   By: Staci Righter M.D.   On: 12/30/2015 10:48    Medications: Reviewed     @PROBHOSP @  1  NSTEMI  Pt with hsitory of CABG in 2011  Presented with hypovoemiic shock and SIRS  Trop elevation felt due to demand in this setting  Has denied CP   Echo with LVEF 55 to 60% I would check trop  Would recomm noninvasive ischemic eval to r/o large area of ischemia  2  HTN  BP is still high  Started amlodipine yesterday  WIll increase to 5 bid  3  Anemia    4  CKD  Cr 3,3  Stable    LOS: 4 days   Joseph Mcdonald 12/31/2015, 8:44 AM

## 2015-12-31 NOTE — Progress Notes (Signed)
Pharmacy Antibiotic Note  Joseph Mcdonald is a 65 y.o. male who continues on day # 4 vancomycin and cefepime for LLL HAP versus aspiration pneumonia. SCr is elevated but at his baseline and overall stable at 3.33. Doses remain appropriate.  Vancomycin trough goal 15-20  Plan: 1) Continue vancomycin 1g IV q48 2) Continue cefepime 1g IV q24 3) Follow up LOT, transition to PO abx  Height: 5\' 6"  (167.6 cm) Weight: 185 lb 11.2 oz (84.2 kg) IBW/kg (Calculated) : 63.8  Temp (24hrs), Avg:98.9 F (37.2 C), Min:98 F (36.7 C), Max:99.9 F (37.7 C)   Recent Labs Lab 12/26/15 1407  12/27/15 0850 12/28/15 0853 12/28/15 1038 12/28/15 1424 12/28/15 2144 12/28/15 2339 12/29/15 0544 12/30/15 0254 12/30/15 1623 12/31/15 0247  WBC  --   --  9.1 21.6*  --   --   --   --  15.9* 14.6*  --  11.2*  CREATININE  --   < >  --  3.58* 3.65*  --   --   --  3.34* 3.36* 3.30* 3.33*  LATICACIDVEN 1.46  --   --   --  1.8 1.8 1.1 0.7  --   --   --   --   < > = values in this interval not displayed.  Estimated Creatinine Clearance: 22.2 mL/min (by C-G formula based on SCr of 3.33 mg/dL (H)).    Allergies  Allergen Reactions  . Penicillins Hives and Itching    Has patient had a PCN reaction causing immediate rash, facial/tongue/throat swelling, SOB or lightheadedness with hypotension: Yes Has patient had a PCN reaction causing severe rash involving mucus membranes or skin necrosis: Yes Has patient had a PCN reaction that required hospitalization No Has patient had a PCN reaction occurring within the last 10 years: No If all of the above answers are "NO", then may proceed with Cephalosporin use.     Antimicrobials this admission: 10/5 Vancomycin  >>  10/5 Cefepime  >>  10/6 Metronidazole >>  Dose adjustments this admission: n/a  Microbiology results: 10/3 urine >> negative 10/3 blood x 2 >> ngtd 10/5 blood x2 >> ngtd 10/5 urine >> negative  Thank you for allowing pharmacy to be a  part of this patient's care.  Deboraha Sprang 12/31/2015 11:47 AM

## 2015-12-31 NOTE — Progress Notes (Signed)
Family Medicine Teaching Service Daily Progress Note Intern Pager: [phone]  Patient name: Joseph Mcdonald Medical record number: [id number] Date of birth: 1950/06/29 Age: 65 y.o. Gender: male  Primary Care Provider: Mercy Riding, MD Consultants: Critical Care Code Status: Full  Pt Overview and Major Events to Date:  12/26/15: AMS, hypotension thought to be 2/2 medications, 6L IV Fluids, Levophed 12/27/15:continue to monitor fluid status and cardiology work up  12/28/15: Troponin 5.05, NSTEMI, transferred to [facility] unit. Concerns for new HAP vs aspiration PNA (new LLL on CXR) 12/29/15: Cardiology work up 10/7: Continue with IV antibiotics, 1 unit pRBCs  Assessment and Plan: Pratt Reda McIntoshis a 65 y.o.malepresenting with altered mental status, hypotension. PMH is significant for alcohol abuse, CAD status post CABG in 2011 and hyperlipidemia, hypertension, type 2 diabetes, chronic kidney disease, chronic back pain, anxiety, depression  Sepsis: Highly suspicious for HAP in LLL vs aspiration pneumonia. XRAY showed opacities in LLL. Mostly resolved except for leukocytosis that is improving (11.2). Afebrile >24 hours. Vitals stable.  Leukocytosis down trending. RVP negative. Limited abdominal U/S performed to assess for acites, possible SBP?Marland Kitchen Abdominal ultrasound showed steatosis but was otherwise negative. Urine culture showing insignificant growth. Blood culture no growth 1 day. - Vital signs per floor protocol - Monitor for fever  - Daily CBC, BMP and blood cultures - Continue Vancomycin and Cefepime (Day 4) - Continue Flagyl (Day 3) - consider transition to levaquin plus Augmentin (to cover for aspiration) -Blood cultures NGTD  Hypovolemic Shock: Resolved. Normotensive/hypertensive this morning and hemodynamically stable. - SLIV - Vitals per floor policy - Holding blood pressure medications for now, amlodipine 5mg  daily started 10/7  NSTEMI- Abnormal EKG (diffuse  ST Depression) with elevated Troponin:Cardiology saw patient on 10/3 in the ED and thought this was likely due to global ischemia. Patient had episode of SOB and bradycardia but no chest pain. On 10/5, troponin was collected and notable at 5.05, he was transferred to SDU. Heparin was started. Per cards, will not cath until infectious process resolves. Cardiology following and thinks elevated troponins still from demand ischemia and ARF. Troponin now 3.11 from 2.86. Heparin ggt stopped due to decreasing hemoglobin   -Heparin at DVT prophylaxis dose and ASA  -Cardiology consulted, appreciate recommendations - consider stress test in the future. - f/u troponins  -Continuous cardiac monitoring  -STAT EKG for any chest pain  Anemia:  Unclear baseline hgb as he has a wide range from 9.1-12.1 based on Epic review. No signs of any bleeding at this time. Hgb may be low due to dilutional effect from receiving so much fluids. S/p 1 u pRBCs on 10/7, hemoglobin stable now to 9.0. - Monitor for signs of bleeding - Daily CBC - Hgb transfusion threshold of 8  Lactic Acidosis: Resolved. Likely from tissue hypoperfusion.   Abdominal pain:  Likely from constipation. Is still mildly distended but nontender.  - dulcolax suppository daily - Miralax  Acute on Chronic CKD: Suspect prerenal etiology in the setting of hypotension and volume depletion. Stage III Kidney disease at baseline; Creatinine today 3.33. Creatinine baseline 2.2-2.4. Fluids stopped due to volume overload, was given 80 mg Lasix IV and had 3.5 L of fluid output yesterday.  Now only up 1.9 L up. -Monitor daily BMP  Type 2 diabetes:  Last HgA1c 8.3. Patient on levemir 75U at bedtime and novolog 30units 3x daily with meals.  CBGs all in the 200s. Given moderate control in the hospital, I'm curious about compliance at home.  -  on Novolog 4 units TID with meals - increase to 36U levemir daily - SSI - qACHS CBGs  Chronic back pain:   - continue  oxycodone-acetaminophen 5-325mg  PRN - continue gabapentin 300mg  BID   Anxiety/depression: Stable - continue lexapro 10mg  daily - holding klonopin 0.5mg  PRN  Hx of Hypertension:Home hydralazine 50 mg TID, amlodipine 5 mg BIDand lisinopril 20 mg daily. BP currently 150-160s/60s.  - restarted amlodipine 5mg  daily on 10/7.  - holding other BP medications given hypotension on presentation  FEN/GI: regular diet, SLIV PPx: SubQ Heparin  Disposition: pending transition to antibiotics and cardiology w/u. Needs PT eval and urine foley out.   Subjective:  Patient doing well. No chest pain or SOB. No abdominal pain. Tried of being in bed.   Objective: Temp:  [98 F (36.7 C)-99.9 F (37.7 C)] 99.1 F (37.3 C) (10/08 0519) Pulse Rate:  [51-61] 51 (10/08 0519) Resp:  [19-22] 20 (10/08 0519) BP: (136-194)/(60-75) 157/66 (10/08 0519) SpO2:  [94 %-96 %] 95 % (10/08 0519) Weight:  [184 lb 1.6 oz (83.5 kg)-185 lb 11.2 oz (84.2 kg)] 185 lb 11.2 oz (84.2 kg) (10/08 0519) Physical Exam: General: lying in bed, in no acute distress Cardiovascular: RRR, s1/s2 present with no murmurs. No thrills.  Respiratory: normal work of breathing, no wheezing, noted to have rhonchi in RUL.  Gastrointestinal: soft, distended, no tenderness, normal bowel sounds MSK: Trace edema in bilateral ankles Derm: warm and dry Neuro: AAOx3 Psych: normal mood and affect  Laboratory:  Recent Labs Lab 12/29/15 0544 12/29/15 2156 12/30/15 0254 12/31/15 0247  WBC 15.9*  --  14.6* 11.2*  HGB 7.8* 8.9* 8.8* 9.0*  HCT 23.9* 27.3* 27.3* 27.7*  PLT 172  --  200 221    Recent Labs Lab 12/26/15 1022 12/27/15 0548  12/28/15 1038  12/30/15 0254 12/30/15 1623 12/31/15 0247  NA 136 135  < > 130*  < > 135 137 136  K 3.2* 4.4  < > 5.0  < > 5.3* 4.8 4.7  CL 107 110  < > 105  < > 109 107 106  CO2 13* 17*  < > 17*  < > 20* 22 22  BUN 69* 55*  < > 39*  < > 40* 40* 42*  CREATININE 5.10* 3.92*  < > 3.65*  < > 3.36*  3.30* 3.33*  CALCIUM 7.6* 7.1*  < > 7.6*  < > 8.0* 8.1* 8.0*  PROT 5.5* 4.8*  --  5.3*  --   --   --   --   BILITOT 0.6 0.3  --  0.3  --   --   --   --   ALKPHOS 37* 32*  --  41  --   --   --   --   ALT 11* 13*  --  14*  --   --   --   --   AST 15 15  --  32  --   --   --   --   GLUCOSE 88 228*  < > 210*  < > 221* 163* 247*  < > = values in this interval not displayed.   Imaging/Diagnostic Tests: Dg Chest 2 View  Result Date: 12/28/2015 CLINICAL DATA:  Fever, shortness of breath. History of diabetes, acute on chronic renal failure, hypotension. EXAM: CHEST  2 VIEW COMPARISON:  Portable chest x-ray of December 26, 2015 FINDINGS: The lungs are well-expanded. The pulmonary interstitial edema has decreased. There is persistent increased interstitial density in  the left mid and lower lung. Tiny pleural effusions blunt the costophrenic angles bilaterally. The cardiac silhouette is top-normal in size. The pulmonary vascularity is not engorged. There are changes from previous CABG. There is calcification in the wall of the thoracic aorta. The bony thorax exhibits no acute abnormality. IMPRESSION: Improving pulmonary interstitial edema. Persistent atelectasis in the left mid and lower lung. Cardiomegaly without pulmonary vascular congestion. Small bilateral pleural effusions. Aortic atherosclerosis. Electronically Signed   By: David  Martinique M.D.   On: 12/28/2015 07:53   US Abdomen Limited  Result Date: 12/30/2015 CLINICAL DATA:  Abdominal pain, ascites.  Alcohol abuse. EXAM: US ABDOMEN LIMITED - RIGHT UPPER QUADRANT COMPARISON:  CT abdomen and pelvis 12/26/2015. FINDINGS: Gallbladder: No gallstones or wall thickening visualized. No sonographic Murphy sign noted by sonographer. Common bile duct: Diameter: Normal measuring 4 mm. Liver: Increased echogenicity consistent with steatosis.  No focal lesion. IMPRESSION: Steatosis, otherwise negative. Electronically Signed   By: Staci Righter M.D.   On: 12/30/2015  10:48   Dg Chest Port 1 View  Result Date: 12/29/2015 CLINICAL DATA:  Pneumonia.  Cough. EXAM: PORTABLE CHEST 1 VIEW COMPARISON:  12/28/2015 FINDINGS: Mild cardiac enlargement is identified. Small left pleural effusion is again noted. Left mid lung and left lower lobe platelike atelectasis is similar to previous exam. No new findings. IMPRESSION: 1. Small left effusion. 2. Persistent left midlung and left lower lobe atelectasis. Electronically Signed   By: Kerby Moors M.D.   On: 12/29/2015 10:22    Archie Patten, MD 12/31/2015, 7:32 AM  [place] Intern pager: [phone], text pages welcome

## 2016-01-01 ENCOUNTER — Inpatient Hospital Stay (HOSPITAL_COMMUNITY): Payer: Medicare Other

## 2016-01-01 DIAGNOSIS — I214 Non-ST elevation (NSTEMI) myocardial infarction: Secondary | ICD-10-CM

## 2016-01-01 LAB — BASIC METABOLIC PANEL
Anion gap: 10 (ref 5–15)
BUN: 44 mg/dL — AB (ref 6–20)
CHLORIDE: 104 mmol/L (ref 101–111)
CO2: 24 mmol/L (ref 22–32)
CREATININE: 2.91 mg/dL — AB (ref 0.61–1.24)
Calcium: 8.4 mg/dL — ABNORMAL LOW (ref 8.9–10.3)
GFR, EST AFRICAN AMERICAN: 24 mL/min — AB (ref >60)
GFR, EST NON AFRICAN AMERICAN: 21 mL/min — AB (ref >60)
Glucose, Bld: 217 mg/dL — ABNORMAL HIGH (ref 65–99)
POTASSIUM: 4.7 mmol/L (ref 3.5–5.1)
SODIUM: 138 mmol/L (ref 135–145)

## 2016-01-01 LAB — NM MYOCAR MULTI W/SPECT W/WALL MOTION / EF
CHL CUP NUCLEAR SSS: 6
CSEPHR: 46 %
Estimated workload: 1 METS
Exercise duration (min): 5 min
LHR: 0.35
LV dias vol: 189 mL (ref 62–150)
LVSYSVOL: 114 mL
MPHR: 154 {beats}/min
NUC STRESS TID: 1.14
Peak HR: 72 {beats}/min
Rest HR: 49 {beats}/min
SDS: 5
SRS: 1

## 2016-01-01 LAB — GLUCOSE, CAPILLARY
GLUCOSE-CAPILLARY: 170 mg/dL — AB (ref 65–99)
GLUCOSE-CAPILLARY: 164 mg/dL — AB (ref 65–99)
Glucose-Capillary: 305 mg/dL — ABNORMAL HIGH (ref 65–99)
Glucose-Capillary: 230 mg/dL — ABNORMAL HIGH (ref 65–99)

## 2016-01-01 LAB — CBC
HCT: 28.8 % — ABNORMAL LOW (ref 39.0–52.0)
Hemoglobin: 9.3 g/dL — ABNORMAL LOW (ref 13.0–17.0)
MCH: 29.8 pg (ref 26.0–34.0)
MCHC: 32.3 g/dL (ref 30.0–36.0)
MCV: 92.3 fL (ref 78.0–100.0)
PLATELETS: 238 10*3/uL (ref 150–400)
RBC: 3.12 MIL/uL — AB (ref 4.22–5.81)
RDW: 13.2 % (ref 11.5–15.5)
WBC: 12.4 10*3/uL — AB (ref 4.0–10.5)

## 2016-01-01 MED ORDER — REGADENOSON 0.4 MG/5ML IV SOLN
INTRAVENOUS | Status: AC
  Administered 2016-01-01: 0.4 mg via INTRAVENOUS
  Filled 2016-01-01: qty 5

## 2016-01-01 MED ORDER — REGADENOSON 0.4 MG/5ML IV SOLN
0.4000 mg | Freq: Once | INTRAVENOUS | Status: AC
  Administered 2016-01-01: 0.4 mg via INTRAVENOUS
  Filled 2016-01-01: qty 5

## 2016-01-01 MED ORDER — TECHNETIUM TC 99M TETROFOSMIN IV KIT
30.0000 | PACK | Freq: Once | INTRAVENOUS | Status: AC | PRN
  Administered 2016-01-01: 30 via INTRAVENOUS

## 2016-01-01 MED ORDER — TECHNETIUM TC 99M TETROFOSMIN IV KIT
10.0000 | PACK | Freq: Once | INTRAVENOUS | Status: AC | PRN
  Administered 2016-01-01: 10 via INTRAVENOUS

## 2016-01-01 MED ORDER — LEVOFLOXACIN 500 MG PO TABS: 500.0000 mg | ORAL_TABLET | ORAL | Status: DC

## 2016-01-01 MED ORDER — ASPIRIN EC 81 MG PO TBEC
81.0000 mg | DELAYED_RELEASE_TABLET | Freq: Every day | ORAL | Status: DC
  Administered 2016-01-01 – 2016-01-02 (×2): 81 mg via ORAL
  Filled 2016-01-01 (×2): qty 1

## 2016-01-01 MED ORDER — ADULT MULTIVITAMIN W/MINERALS CH
1.0000 | ORAL_TABLET | ORAL | Status: DC
  Administered 2016-01-01: 1 via ORAL
  Filled 2016-01-01: qty 1

## 2016-01-01 NOTE — Progress Notes (Signed)
Family Medicine Teaching Service Daily Progress Note Intern Pager: [phone]  Patient name: Joseph Mcdonald Medical record number: [id number] Date of birth: May 18, 1950 Age: 65 y.o. Gender: male  Primary Care Provider: Mercy Riding, MD Consultants: Critical Care Code Status: Full  Pt Overview and Major Events to Date:  12/26/15: AMS, hypotension thought to be 2/2 medications, 6L IV Fluids, Levophed 12/27/15:continue to monitor fluid status and cardiology work up  12/28/15: Troponin 5.05, NSTEMI, transferred to [facility] unit. Concerns for new HAP vs aspiration PNA (new LLL on CXR) 12/29/15: Cardiology work up 10/7: Continue with IV antibiotics, 1 unit pRBCs 10/8: Continue IV antibiotics 10/9: Stress test Assessment and Plan: Joseph Mcdonald a 65 y.o.malepresenting with altered mental status, hypotension. PMH is significant for alcohol abuse, CAD status post CABG in 2011 and hyperlipidemia, hypertension, type 2 diabetes, chronic kidney disease, chronic back pain, anxiety, depression  Sepsis: Highly suspicious for HAP in LLL vs aspiration pneumonia. XRAY showed opacities in LLL. Mostly resolved except for leukocytosis that is 12.4 today ( up from 11.2). Afebrile >24 hours. Vitals stable. Leukocytosis down trending. RVP negative. Urine culture showing insignificant growth. Blood culture NGTD - Vital signs per floor protocol - Monitor for fever  - Daily CBC, BMP and blood cultures - Stop Vanc/Cefipime transition to PO levaquin today - Continue PO Flagyl for anaerobic coverage for possible aspiration pneumonia -Blood cultures NGTD  Hypovolemic Shock:Resolved. 155/69  this morning and hemodynamically stable. Pulse 44. - SLIV - Vitals per floor policy - Holding blood pressure medications for now, amlodipine 5mg  BID daily started 10/8  NSTEMI- Abnormal EKG (diffuse ST Depression) with elevated Troponin:Cardiology saw patient on 10/3 in the ED and thought this was likely  due to global ischemia. Patient had episode of SOB and bradycardia but no chest pain. On 10/5, troponin was collected and notable at 5.05, he was transferred to SDU. Heparin was started. Per cards, will not cath until infectious process resolves. Cardiology following and thinks elevated troponins still from demand ischemia and ARF. Troponin now 3.11 from 2.86. Heparin ggt stopped due to decreasing hemoglobin   -Heparin at DVT prophylaxis dose and ASA  -Cardiology consulted, appreciate recommendations - stress test today -Continuous cardiac monitoring  -STAT EKG for any chest pain  Anemia: Unclear baseline hgb as he has a wide range from 9.1-12.1 based on Epic review. No signs of any bleeding at this time. Hgb may be low due to dilutional effect from receiving so much fluids. S/p 1 u pRBCs on 10/7, hemoglobin stable now to 9.3. Acute blood loss vs Anemia of Chronic Disease- consult nephrology - Monitor for signs of bleeding - Daily CBC - Hgb transfusion threshold of 8  Lactic Acidosis:Resolved. Likely from tissue hypoperfusion.   Abdominal pain:Likely from constipation. Is still mildly distended but nontender.  - dulcolax suppository daily - Miralax  Acute on Chronic [id number] prerenal etiology in the setting of hypotension and volume depletion. Stage III Kidney disease at baseline; Creatinine today 2.91 down from 3.33.Creatinine baseline 2.2-2.4. Fluids stopped due to volume overload, was given 80 mg Lasix IV and had 3.5 L of fluid output yesterday. Now only up 1.9 L up. -Monitor daily BMP  Type 2 diabetes:Last HgA1c 8.3. Patient on levemir 75U at bedtime and novolog 30units 3x daily with meals. CBGs 170 today.  - on Novolog 4 units TID with meals - Continue 36U levemir daily - SSI - qACHS CBGs  Chronic back pain:  - continue oxycodone-acetaminophen 5-325mg  PRN - continue gabapentin 300mg   BID   Anxiety/depression:Stable - continue lexapro 10mg  daily - holding  klonopin 0.5mg  PRN  Hx of Hypertension:Home hydralazine 50 mg TID, amlodipine 5 mg BIDand lisinopril 20 mg daily. BP currently 150-160s/60s.  - restarted amlodipine 5mg  daily on 10/7 and increased to BID 10/8 - holding other BP medications given hypotension on presentation  FEN/GI: NPO  PPx: SubQ Heparin  Disposition: Home  Subjective:  Patient is laying in bed comfortably. He says he is doing fine today. He denies lightheadedness, dizziness, chest pain, shortness of breath, and abdominal pain. Says his last bowel movement was Friday or Saturday night.  Objective: Temp:  [98.3 F (36.8 C)-98.9 F (37.2 C)] 98.8 F (37.1 C) (10/09 0402) Pulse Rate:  [44-98] 44 (10/09 0402) Resp:  [18-20] 18 (10/09 0402) BP: (148-162)/(60-69) 155/69 (10/09 0402) SpO2:  [95 %-97 %] 97 % (10/09 0402) Weight:  [184 lb 9.6 oz (83.7 kg)] 184 lb 9.6 oz (83.7 kg) (10/09 0402) Physical Exam: General: lying in bed, in no acute distress Cardiovascular: RRR, s1/s2 present with no murmurs Respiratory: On RA, normal work of breathing, some wheezing bilaterally Gastrointestinal: soft, distended, no tenderness, normal bowel sounds MSK: Trace edema in bilateral ankles Derm: warm and dry Neuro: AAOx3 Psych: normal mood and affect  Laboratory:  Recent Labs Lab 12/30/15 0254 12/31/15 0247 01/01/16 0419  WBC 14.6* 11.2* 12.4*  HGB 8.8* 9.0* 9.3*  HCT 27.3* 27.7* 28.8*  PLT 200 221 238    Recent Labs Lab 12/26/15 1022 12/27/15 0548  12/28/15 1038  12/30/15 1623 12/31/15 0247 01/01/16 0419  NA 136 135  < > 130*  < > 137 136 138  K 3.2* 4.4  < > 5.0  < > 4.8 4.7 4.7  CL 107 110  < > 105  < > 107 106 104  CO2 13* 17*  < > 17*  < > 22 22 24   BUN 69* 55*  < > 39*  < > 40* 42* 44*  CREATININE 5.10* 3.92*  < > 3.65*  < > 3.30* 3.33* 2.91*  CALCIUM 7.6* 7.1*  < > 7.6*  < > 8.1* 8.0* 8.4*  PROT 5.5* 4.8*  --  5.3*  --   --   --   --   BILITOT 0.6 0.3  --  0.3  --   --   --   --   ALKPHOS 37* 32*   --  41  --   --   --   --   ALT 11* 13*  --  14*  --   --   --   --   AST 15 15  --  32  --   --   --   --   GLUCOSE 88 228*  < > 210*  < > 163* 247* 217*  < > = values in this interval not displayed.   Imaging/Diagnostic Tests: US Abdomen Limited  Result Date: 12/30/2015 CLINICAL DATA:  Abdominal pain, ascites.  Alcohol abuse. EXAM: US ABDOMEN LIMITED - RIGHT UPPER QUADRANT COMPARISON:  CT abdomen and pelvis 12/26/2015. FINDINGS: Gallbladder: No gallstones or wall thickening visualized. No sonographic Murphy sign noted by sonographer. Common bile duct: Diameter: Normal measuring 4 mm. Liver: Increased echogenicity consistent with steatosis.  No focal lesion. IMPRESSION: Steatosis, otherwise negative. Electronically Signed   By: Staci Righter M.D.   On: 12/30/2015 10:48   Dg Chest Port 1 View  Result Date: 12/29/2015 CLINICAL DATA:  Pneumonia.  Cough. EXAM: PORTABLE CHEST 1 VIEW COMPARISON:  12/28/2015 FINDINGS: Mild cardiac enlargement is identified. Small left pleural effusion is again noted. Left mid lung and left lower lobe platelike atelectasis is similar to previous exam. No new findings. IMPRESSION: 1. Small left effusion. 2. Persistent left midlung and left lower lobe atelectasis. Electronically Signed   By: Kerby Moors M.D.   On: 12/29/2015 10:22    Farrel Conners, Medical Student 01/01/2016, 9:32 AM [place] Intern pager: [phone], text pages welcome  I have separately seen and examined the patient. I have discussed the findings and exam with student doctor Farrel Conners and agree with the above note.  My changes/additions are outlined in BLUE.   Smitty Cords, MD [place], PGY-2

## 2016-01-01 NOTE — Progress Notes (Signed)
Patient Profile: Pt with known CAD and history of CABG in 2011, who presented with hypovoemiic shock and SIRS. Troponin peaked at 3.11. Trop elevation felt due to demand in this setting of sepsis.  He has denied CP.  Echo with LVEF 55 to 60%. NST pending to r/o large area of ischemia.   Subjective: No complaints. CP free. Tolerated NST well.   Objective: Vital signs in last 24 hours: Temp:  [98.3 F (36.8 C)-98.9 F (37.2 C)] 98.8 F (37.1 C) (10/09 0402) Pulse Rate:  [44-98] 49 (10/09 1000) Resp:  [18-20] 18 (10/09 0402) BP: (148-167)/(60-73) 167/73 (10/09 1000) SpO2:  [95 %-97 %] 97 % (10/09 0402) Weight:  [184 lb 9.6 oz (83.7 kg)] 184 lb 9.6 oz (83.7 kg) (10/09 0402) Last BM Date: 12/29/15  Intake/Output from previous day: 10/08 0701 - 10/09 0700 In: 500 [P.O.:500] Out: 4500 [Urine:4500] Intake/Output this shift: Total I/O In: 0  Out: 900 [Urine:900]  Medications Current Facility-Administered Medications  Medication Dose Route Frequency Provider Last Rate Last Dose  . 0.9 %  sodium chloride infusion   Intravenous Continuous Kittredge N Rumley, DO 10 mL/hr at 12/30/15 1900    . acetaminophen (TYLENOL) tablet 650 mg  650 mg Oral Q6H PRN Lovenia Kim, MD   650 mg at 12/28/15 2204  . allopurinol (ZYLOPRIM) tablet 100 mg  100 mg Oral Daily Eloise Levels, MD   100 mg at 12/31/15 0853  . amLODipine (NORVASC) tablet 5 mg  5 mg Oral BID Fay Records, MD   5 mg at 12/31/15 2109  . atorvastatin (LIPITOR) tablet 40 mg  40 mg Oral q1800 Eloise Levels, MD   40 mg at 12/31/15 1737  . bisacodyl (DULCOLAX) suppository 10 mg  10 mg Rectal Daily Carlyle Dolly, MD   10 mg at 12/29/15 1201  . calcium acetate (PHOSLO) capsule 667 mg  667 mg Oral TID WC Eloise Levels, MD   667 mg at 12/31/15 1737  . docusate sodium (COLACE) capsule 100 mg  100 mg Oral BID PRN Janora Norlander, DO   100 mg at 12/28/15 0900  . folic acid (FOLVITE) tablet 1 mg  1 mg Oral Daily Carlyle Dolly,  MD   1 mg at 12/31/15 [id number]  . gabapentin (NEURONTIN) tablet 300 mg  300 mg Oral BID Janora Norlander, DO   300 mg at 12/31/15 2109  . heparin injection 5,000 Units  5,000 Units Subcutaneous Q8H Peter M Martinique, MD   5,000 Units at 01/01/16 [phone]  . insulin aspart (novoLOG) injection 0-15 Units  0-15 Units Subcutaneous TID WC Lupita Dawn, MD   3 Units at 01/01/16 [phone]  . insulin aspart (novoLOG) injection 0-5 Units  0-5 Units Subcutaneous QHS Eloise Levels, MD   3 Units at 12/31/15 2110  . insulin aspart (novoLOG) injection 4 Units  4 Units Subcutaneous TID WC Eloise Levels, MD   4 Units at 12/31/15 1738  . insulin detemir (LEVEMIR) injection 36 Units  36 Units Subcutaneous QHS Archie Patten, MD   36 Units at 12/31/15 2109  . levofloxacin (LEVAQUIN) tablet 750 mg  750 mg Oral Q48H Crystal Libby Maw, MD      . metroNIDAZOLE (FLAGYL) tablet 500 mg  500 mg Oral Q6H Archie Patten, MD   500 mg at 01/01/16 0641  . multivitamin with minerals tablet 1 tablet  1 tablet Oral Daily Carlyle Dolly, MD   1 tablet at  12/31/15 0837  . oxyCODONE-acetaminophen (PERCOCET/ROXICET) 5-325 MG per tablet 1 tablet  1 tablet Oral Q8H PRN Eloise Levels, MD   1 tablet at 01/01/16 0041  . polyethylene glycol (MIRALAX / GLYCOLAX) packet 17 g  17 g Oral Daily Carlyle Dolly, MD   17 g at 12/31/15 [id number]  . thiamine (VITAMIN B-1) tablet 100 mg  100 mg Oral Daily Carlyle Dolly, MD   100 mg at 12/31/15 0854   Or  . thiamine (B-1) injection 100 mg  100 mg Intravenous Daily Carlyle Dolly, MD        PE: General: Alert, awake, oriented x3, in no acute distress Neck:  JVP is normal Heart: Regular rate and rhythm, without murmurs, rubs, gallops.  Lungs: Clear to auscultation.    Exemities:  trace bilateral LE edema.   Neuro: Grossly intact, nonfocal.  Lab Results:   Recent Labs  12/30/15 0254 12/31/15 0247 01/01/16 0419  WBC 14.6* 11.2* 12.4*  HGB 8.8* 9.0* 9.3*  HCT 27.3* 27.7* 28.8*    PLT 200 221 238   BMET  Recent Labs  12/30/15 1623 12/31/15 0247 01/01/16 0419  NA 137 136 138  K 4.8 4.7 4.7  CL 107 106 104  CO2 22 22 24   GLUCOSE 163* 247* 217*  BUN 40* 42* 44*  CREATININE 3.30* 3.33* 2.91*  CALCIUM 8.1* 8.0* 8.4*   Cardiac Panel (last 3 results)  Recent Labs  12/30/15 0254 12/30/15 0907 12/31/15 0930  TROPONINI 2.86* 3.11* 1.63*    Studies/Results: NST - pending    Assessment/Plan  Principal Problem:   SIRS (systemic inflammatory response syndrome) (HCC) Active Problems:   Coronary atherosclerosis   Type 2 diabetes mellitus (HCC)   Anemia of chronic disease   Hypovolemic shock (HCC)   Abnormal ECG   Acute renal failure (ARF) (HCC)   Hypotension   Syncope and collapse   Chronic kidney disease, stage 4 (severe) (HCC)   AKI (acute kidney injury) ([place])   History of ETOH abuse   ST segment depression   Pneumonia due to infectious organism   1.  NSTEMI: Pt with hsitory of CABG in 2011.  Presented with hypovoemiic shock and SIRS. Troponin peaked at 3.11. Trop elevation felt due to demand ischemia in the setting of sepsis, on top of CKD with Scr at 3. He has denied CP.   Echo with LVEF of 55 to 60%. NST completed to r/o large area of ischemia. Results pending.   2.  HTN: Improved. Amlodipine recently added to regimen.   3 Anemia:  Hgb improving, from 8.8>>9.0>>9.3.   4.  CKD:  stable and improved since yesterday, 3.33>>2.91. Per records, baseline is ~2.2-2.4.     LOS: 5 days    Brittainy M. Rosita Fire, PA-C 01/01/2016 10:43 AM

## 2016-01-01 NOTE — Progress Notes (Signed)
Followed up on consult. Patient happy to have finished stress test, said felt stronger, looking forward to going home hopefully tomorrow. Patient has strong faith, believes all is in God's hands, and thanks God for his wife, sister, daughter and family in this area who care about him. He mentioned they don't give you enough to live on disability. He was grateful for his sister (also on disability) for buying him new boots. Provided emotional/spiritual support and prayer. Chaplain available to follow up.   01/01/16 1700  Clinical Encounter Type  Visited With Patient  Visit Type Follow-up  Referral From Nurse  Spiritual Encounters  Spiritual Needs Prayer;Emotional  Stress Factors  Patient Stress Factors Financial concerns;Health changes

## 2016-01-01 NOTE — Evaluation (Signed)
Physical Therapy Evaluation Patient Details Name: Joseph Mcdonald MRN: [id number] DOB: [birth year] Today's Date: 01/01/2016   History of Present Illness  65 y/o WM admitted with SIRS after being found slumped over in a chair at [place].  He was also transferred to SDU with elevated Troponin and NSTEMI.  PMH incluses, CAD, CABG, ETOH abuse, HLD, HTN, DM2, CKD- stage 4, anxiety and depression.  Clinical Impression  Pt admitted with above diagnosis. Pt currently with functional limitations due to the deficits listed below (see PT Problem List).  Pt will benefit from skilled PT to increase their independence and safety with mobility to allow discharge to the venue listed below.  Pt feeling slightly weak due to not having been OOB in about a week.  Used RW for hallway ambulation and no AD in room.  At this time recommend RW and 3-1 BSC, but don't think he will necessarily need any HH services.  Will continue to assess.  He would benefit from stair training at next session.  Transport arrived to take pt to a test and deferred stairs today.     Follow Up Recommendations Supervision for mobility/OOB    Equipment Recommendations  Rolling walker with 5" wheels;3in1 (PT)    Recommendations for Other Services       Precautions / Restrictions Precautions Precautions: Fall      Mobility  Bed Mobility Overal bed mobility: Modified Independent             General bed mobility comments: bed flat with rails  Transfers Overall transfer level: Needs assistance   Transfers: Sit to/from Stand Sit to Stand: Supervision         General transfer comment: S for safety due to not having been up in a while  Ambulation/Gait Ambulation/Gait assistance: Min guard Ambulation Distance (Feet): 180 Feet Assistive device: Rolling walker (2 wheeled) Gait Pattern/deviations: Step-through pattern Gait velocity: decreased   General Gait Details: Amb with RW for steadiness due to not having  ambulated in almost a week. Transport arrived to take pt to test and pt ambulted in room without AD with MIN/guard  Stairs            Wheelchair Mobility    Modified Rankin (Stroke Patients Only)       Balance Overall balance assessment: Needs assistance   Sitting balance-Leahy Scale: Good Sitting balance - Comments: Pt able to don socks while seated EOB     Standing balance-Leahy Scale: Fair                               Pertinent Vitals/Pain Pain Assessment: No/denies pain    Home Living Family/patient expects to be discharged to:: Private residence Living Arrangements: Spouse/significant other Available Help at Discharge: Family;Available 24 hours/day Type of Home: Mobile home Home Access: Stairs to enter Entrance Stairs-Rails: None Entrance Stairs-Number of Steps: 4 Home Layout: One level Home Equipment: Cane - single point      Prior Function Level of Independence: Independent               Hand Dominance        Extremity/Trunk Assessment   Upper Extremity Assessment: Overall WFL for tasks assessed           Lower Extremity Assessment: Overall WFL for tasks assessed      Cervical / Trunk Assessment: Normal  Communication   Communication: No difficulties  Cognition Arousal/Alertness: Awake/alert Behavior During Therapy: [facility]  for tasks assessed/performed Overall Cognitive Status: Within Functional Limits for tasks assessed                      General Comments      Exercises     Assessment/Plan    PT Assessment Patient needs continued PT services  PT Problem List Decreased strength;Decreased activity tolerance;Decreased mobility;Decreased balance          PT Treatment Interventions DME instruction;Gait training;Stair training;Functional mobility training;Therapeutic activities;Therapeutic exercise    PT Goals (Current goals can be found in the Care Plan section)  Acute Rehab PT Goals Patient Stated  Goal: go home PT Goal Formulation: With patient Time For Goal Achievement: 01/08/16 Potential to Achieve Goals: Good    Frequency Min 3X/week   Barriers to discharge Inaccessible home environment      Co-evaluation               End of Session Equipment Utilized During Treatment: Gait belt Activity Tolerance: Patient tolerated treatment well Patient left: Other (comment) (with transport) Nurse Communication: Mobility status         Time: [id number] PT Time Calculation (min) (ACUTE ONLY): 15 min   Charges:   PT Evaluation $PT Eval Moderate Complexity: 1 Procedure     PT G Codes:        Anaelle Dunton LUBECK 01/01/2016, 9:07 AM

## 2016-01-01 NOTE — Care Management Important Message (Signed)
Important Message  Patient Details  Name: Joseph Mcdonald MRN: [id number] Date of Birth: 1950/04/04   Medicare Important Message Given:  Yes    Clement Deneault 01/01/2016, 11:11 AM

## 2016-01-01 NOTE — Care Management Important Message (Signed)
Important Message  Patient Details  Name: Joseph Mcdonald MRN: [id number] Date of Birth: 1950/06/18   Medicare Important Message Given:  Yes    Kourtlynn Trevor 01/01/2016, 11:12 AM

## 2016-01-01 NOTE — Progress Notes (Signed)
Responded to consult. Patient in testing till mid-afternoon. Chaplain will check back later.

## 2016-01-02 ENCOUNTER — Telehealth: Payer: Self-pay | Admitting: Student

## 2016-01-02 LAB — BASIC METABOLIC PANEL
ANION GAP: 9 (ref 5–15)
BUN: 44 mg/dL — ABNORMAL HIGH (ref 6–20)
CALCIUM: 8.4 mg/dL — AB (ref 8.9–10.3)
CO2: 23 mmol/L (ref 22–32)
CREATININE: 2.89 mg/dL — AB (ref 0.61–1.24)
Chloride: 104 mmol/L (ref 101–111)
GFR calc Af Amer: 25 mL/min — ABNORMAL LOW (ref >60)
GFR, EST NON AFRICAN AMERICAN: 21 mL/min — AB (ref >60)
Glucose, Bld: 242 mg/dL — ABNORMAL HIGH (ref 65–99)
Potassium: 4.6 mmol/L (ref 3.5–5.1)
Sodium: 136 mmol/L (ref 135–145)

## 2016-01-02 LAB — CBC
HEMATOCRIT: 28.5 % — AB (ref 39.0–52.0)
Hemoglobin: 9.2 g/dL — ABNORMAL LOW (ref 13.0–17.0)
MCH: 30.1 pg (ref 26.0–34.0)
MCHC: 32.3 g/dL (ref 30.0–36.0)
MCV: 93.1 fL (ref 78.0–100.0)
PLATELETS: 242 10*3/uL (ref 150–400)
RBC: 3.06 MIL/uL — ABNORMAL LOW (ref 4.22–5.81)
RDW: 13.2 % (ref 11.5–15.5)
WBC: 13.2 10*3/uL — ABNORMAL HIGH (ref 4.0–10.5)

## 2016-01-02 LAB — GLUCOSE, CAPILLARY
GLUCOSE-CAPILLARY: 192 mg/dL — AB (ref 65–99)
GLUCOSE-CAPILLARY: 207 mg/dL — AB (ref 65–99)
GLUCOSE-CAPILLARY: 227 mg/dL — AB (ref 65–99)

## 2016-01-02 LAB — CULTURE, BLOOD (ROUTINE X 2)
Culture: NO GROWTH
Culture: NO GROWTH

## 2016-01-02 MED ORDER — AMLODIPINE BESYLATE 10 MG PO TABS
10.0000 mg | ORAL_TABLET | Freq: Every day | ORAL | Status: DC
  Administered 2016-01-02: 10 mg via ORAL
  Filled 2016-01-02: qty 1

## 2016-01-02 MED ORDER — CLONAZEPAM 0.5 MG PO TABS: 0.5000 mg | ORAL_TABLET | Freq: Two times a day (BID) | ORAL | Status: DC | PRN

## 2016-01-02 MED ORDER — LEVOFLOXACIN 500 MG PO TABS: 500.0000 mg | ORAL_TABLET | ORAL | 0 refills | Status: DC

## 2016-01-02 MED ORDER — INSULIN DETEMIR 100 UNIT/ML ~~LOC~~ SOLN: 38.0000 [IU] | Freq: Every day | SUBCUTANEOUS | 11 refills | Status: DC

## 2016-01-02 MED ORDER — POLYETHYLENE GLYCOL 3350 17 G PO PACK
17.0000 g | PACK | Freq: Two times a day (BID) | ORAL | Status: DC
  Administered 2016-01-02: 17 g via ORAL
  Filled 2016-01-02: qty 1

## 2016-01-02 MED ORDER — INSULIN DETEMIR 100 UNIT/ML ~~LOC~~ SOLN
38.0000 [IU] | Freq: Every day | SUBCUTANEOUS | Status: DC
  Filled 2016-01-02: qty 0.38

## 2016-01-02 MED ORDER — POLYETHYLENE GLYCOL 3350 17 G PO PACK: 17.0000 g | PACK | Freq: Two times a day (BID) | ORAL | 0 refills | Status: DC

## 2016-01-02 MED ORDER — LEVOFLOXACIN 500 MG PO TABS
500.0000 mg | ORAL_TABLET | ORAL | Status: DC
  Administered 2016-01-02: 500 mg via ORAL
  Filled 2016-01-02: qty 1

## 2016-01-02 MED ORDER — INSULIN ASPART 100 UNIT/ML ~~LOC~~ SOLN
5.0000 [IU] | Freq: Three times a day (TID) | SUBCUTANEOUS | Status: DC
  Administered 2016-01-02: 5 [IU] via SUBCUTANEOUS

## 2016-01-02 MED ORDER — INSULIN ASPART 100 UNIT/ML ~~LOC~~ SOLN: 5.0000 [IU] | Freq: Three times a day (TID) | SUBCUTANEOUS | 1 refills | Status: DC

## 2016-01-02 NOTE — Progress Notes (Signed)
Family Medicine Teaching Service Daily Progress Note Intern Pager: [phone]  Patient name: Joseph Mcdonald Medical record number: [id number] Date of birth: [birth year] Age: 65 y.o. Gender: male  Primary Care Provider: Mercy Riding, MD Consultants: Critical Care Code Status: Full  Pt Overview and Major Events to Date:  12/26/15: AMS, hypotension thought to be 2/2 medications, 6L IV Fluids, Levophed 12/27/15:continue to monitor fluid status and cardiology work up  12/28/15: Troponin 5.05, NSTEMI, transferred to [facility] unit. Concerns for new HAP vs aspiration PNA (new LLL on CXR) 12/29/15: Cardiology work up 10/7: Continue with IV antibiotics, 1 unit pRBCs 10/8: Continue IV antibiotics 10/9: Stress test 10/10: Likely DC home   Assessment and Plan: Naomi Paulick McIntoshis a 65 y.o.malepresenting with altered mental status, hypotension. PMH is significant for alcohol abuse, CAD status post CABG in 2011 and hyperlipidemia, hypertension, type 2 diabetes, chronic kidney disease, chronic back pain, anxiety, depression  Sepsis likely from HAP vs aspiration pneumonia: Resolved. Leukocytosis still present with WBC at 13.2. Afebrile >24 hours. Vitals stable except for some bradycardia overnight.Leukocytosis down trending. RVP negative. Urine culture showing insignificant growth. Blood culture NGTD - Vital signs per floor protocol - Monitor for fever  - Daily CBC, BMP and blood cultures - Missed Cefepime coverage yesterday, will start Day 1 of Levaquin today, will do 500 mg q48 hours - Day 3 PO Flagyl for anaerobic coverage for possible aspiration pneumonia -Blood cultures NGTD  Bradycardia: Appears to be only bradycardic at night. Not on any beta blockers, unclear cause. Does not appear to be symptomatic  - Continue to monitor  Hypovolemic Shock:Resolved.  - SLIV - Vitals per floor policy  Abnormal EKG with elevated Troponin: Cardiology following and thinks elevated troponins  still from demand ischemia and ARF. Heparin ggt stopped due to decreasing hemoglobin. Myoview showed no reversible ischemia - EF 40%, however, normal on echo so may be gating error.  -Cardiology consulted, appreciate recommendations, signed off now  -consider adding plavix 75 mg daily in addition to aspirin -Continuous cardiac monitoring  -STAT EKG for any chest pain -Heparin at DVT prophylaxis dose and ASA   Anemia: Unclear baseline hgb as he has a wide range from 9.1-12.1 based on Epic review. No signs of any bleeding at this time. Hgb may be low due to dilutional effect from receiving so much fluids. S/p 1 u pRBCs on 10/7, hemoglobin stable now to 9.2. Acute blood loss vs Anemia of Chronic Disease- consult nephrology - Monitor for signs of bleeding - Daily CBC - Hgb transfusion threshold of 8  Lactic Acidosis:Resolved. Likely from tissue hypoperfusion.   Abdominal pain:Resolved - dulcolax suppository daily - Miralax BID  Acute on Chronic [id number] prerenal etiology in the setting of hypotension and volume depletion. Stage III Kidney disease at baseline; Creatinine today 2.91 down from 3.33.Creatinine baseline 2.2-2.4. Fluids stopped due to volume overload, was given 80 mg Lasix IV and had 3.5 L of fluid output yesterday. Now only up 1.9 L up. -Monitor daily BMP  Type 2 diabetes:Last HgA1c 8.3. Patient on levemir 75U at bedtime and novolog 30units 3x daily with meals. CBGs slightly uncontrolled in high 190's and low 300's - Increase Novolog to 5 units TID with meals from 4 - Increase levemir to 38 units qhs from 36 - SSI - qACHS CBGs  Chronic back pain:  - continue oxycodone-acetaminophen 5-325mg  PRN - continue gabapentin 300mg  BID   Anxiety/depression:Stable - continue home lexapro 10mg  daily - Resume home klonopin 0.5mg  PRN  Hx  of Hypertension:Home hydralazine 50 mg TID, amlodipine 5 mg BIDand lisinopril 20 mg daily. BP currently 150-160s/60s.  -  amlodipine 5mg  BID  - holding other BP medications given hypotension on presentation  FEN/GI: regular diet PPx: SubQ Heparin  Disposition: Home  Subjective:  No acute events overnight. Patient doing well this morning, he is ambulating the halls with assistance. No concerns this morning.   Objective: Temp:  [97.7 F (36.5 C)-98.5 F (36.9 C)] 97.7 F (36.5 C) (10/10 0439) Pulse Rate:  [49-68] 68 (10/10 0439) Resp:  [18] 18 (10/10 0439) BP: (137-167)/(57-73) 155/63 (10/10 0439) SpO2:  [96 %-99 %] 96 % (10/10 0439) Weight:  [182 lb 11.2 oz (82.9 kg)] 182 lb 11.2 oz (82.9 kg) (10/10 0439) Physical Exam: General: ambulating halls, in no acute distress Cardiovascular: Regular rate and rhythm Respiratory: On RA, normal work of breathing Gastrointestinal: soft, distended, no tenderness MSK: Trace edema in bilateral ankles Derm: warm and dry Neuro: AAOx3 Psych: normal mood and affect  Laboratory:  Recent Labs Lab 12/31/15 0247 01/01/16 0419 01/02/16 0206  WBC 11.2* 12.4* 13.2*  HGB 9.0* 9.3* 9.2*  HCT 27.7* 28.8* 28.5*  PLT 221 238 242    Recent Labs Lab 12/26/15 1022 12/27/15 0548  12/28/15 1038  12/30/15 1623 12/31/15 0247 01/01/16 0419  NA 136 135  < > 130*  < > 137 136 138  K 3.2* 4.4  < > 5.0  < > 4.8 4.7 4.7  CL 107 110  < > 105  < > 107 106 104  CO2 13* 17*  < > 17*  < > 22 22 24   BUN 69* 55*  < > 39*  < > 40* 42* 44*  CREATININE 5.10* 3.92*  < > 3.65*  < > 3.30* 3.33* 2.91*  CALCIUM 7.6* 7.1*  < > 7.6*  < > 8.1* 8.0* 8.4*  PROT 5.5* 4.8*  --  5.3*  --   --   --   --   BILITOT 0.6 0.3  --  0.3  --   --   --   --   ALKPHOS 37* 32*  --  41  --   --   --   --   ALT 11* 13*  --  14*  --   --   --   --   AST 15 15  --  32  --   --   --   --   GLUCOSE 88 228*  < > 210*  < > 163* 247* 217*  < > = values in this interval not displayed.   Imaging/Diagnostic Tests: Nm Myocar Multi W/spect W/wall Motion / Ef  Result Date: 01/01/2016  There was no ST segment  deviation noted during stress.  This is an intermediate risk study due to reduced systolic function.  The left ventricular ejection fraction is calculated as moderately decreased (30-44%) but appears normal visually.  No ischemia.    US Abdomen Limited  Result Date: 12/30/2015 CLINICAL DATA:  Abdominal pain, ascites.  Alcohol abuse. EXAM: US ABDOMEN LIMITED - RIGHT UPPER QUADRANT COMPARISON:  CT abdomen and pelvis 12/26/2015. FINDINGS: Gallbladder: No gallstones or wall thickening visualized. No sonographic Murphy sign noted by sonographer. Common bile duct: Diameter: Normal measuring 4 mm. Liver: Increased echogenicity consistent with steatosis.  No focal lesion. IMPRESSION: Steatosis, otherwise negative. Electronically Signed   By: Staci Righter M.D.   On: 12/30/2015 10:48   Dg Chest Port 1 View  Result Date: 12/29/2015 CLINICAL DATA:  Pneumonia.  Cough. EXAM: PORTABLE CHEST 1 VIEW COMPARISON:  12/28/2015 FINDINGS: Mild cardiac enlargement is identified. Small left pleural effusion is again noted. Left mid lung and left lower lobe platelike atelectasis is similar to previous exam. No new findings. IMPRESSION: 1. Small left effusion. 2. Persistent left midlung and left lower lobe atelectasis. Electronically Signed   By: Kerby Moors M.D.   On: 12/29/2015 10:22    Carlyle Dolly, MD 01/02/2016, 8:47 AM [place] Intern pager: [phone], text pages welcome

## 2016-01-02 NOTE — Telephone Encounter (Signed)
Pt was discharged from hospital today. Dr Cyndia Skeeters had taken pt off of several medicaitions. The hospital put pt back on several medications.  Wife would like to talk to dr Cyndia Skeeters about this. She wants to talk to dr Cyndia Skeeters ASAP.

## 2016-01-02 NOTE — Progress Notes (Signed)
Pt has orders to be discharged. Discharge instructions given and pt has no additional questions at this time. Medication regimen reviewed and pt educated. Pt verbalized understanding and has no additional questions. Telemetry box removed. IV removed and site in good condition. Pt stable and waiting for transportation.   Georgi Navarrete RN 

## 2016-01-02 NOTE — Discharge Instructions (Signed)
You were admitted to the hospital for an episode for extreme dehydration. You were also found to have a pneumonia. We gave you IV fluids and antibiotics and you are now stable to go home.   Please see the medications section for changes in your medications. There are changes in your diabetes and blood pressure medications.   Please come back to the hospital if you have any other episodes of passing out, fever, chest pain, shortness of breath, or weakness.   Follow up with Dr. Cyndia Skeeters on 10/13 at 8:30 AM.

## 2016-01-02 NOTE — Progress Notes (Signed)
Physical Therapy Treatment Patient Details Name: Joseph Mcdonald MRN: [id number] DOB: [birth year] Today's Date: 01/02/2016    History of Present Illness 65 y/o WM admitted with SIRS after being found slumped over in a chair at [place].  He was also transferred to SDU with elevated Troponin and NSTEMI.  PMH incluses, CAD, CABG, ETOH abuse, HLD, HTN, DM2, CKD- stage 4, anxiety and depression.    PT Comments    Patient remains globally weak from inactivity/hospitalization. Reports he did not walk any additionally after PT eval. Educated to request nursing assist him with walking at least 2 more times today. Patient denies balance deficits prior to hospitalization and feel he will return to independent ambulation with general increased activity when he returns home. Agree with RW for safety, however do not feel HHPT is warranted.   Follow Up Recommendations  No PT follow up;Supervision for mobility/OOB     Equipment Recommendations  Rolling walker with 5" wheels    Recommendations for Other Services       Precautions / Restrictions Precautions Precautions: Fall    Mobility  Bed Mobility Overal bed mobility: Independent                Transfers Overall transfer level: Needs assistance Equipment used: None Transfers: Sit to/from Stand Sit to Stand: Supervision         General transfer comment: S for safety due to not having been up in a while  Ambulation/Gait Ambulation/Gait assistance: Min assist Ambulation Distance (Feet): 300 Feet Assistive device: None Gait Pattern/deviations: Step-through pattern;Decreased stride length;Decreased weight shift to left;Antalgic;Wide base of support Gait velocity: decreased Gait velocity interpretation: Below normal speed for age/gender General Gait Details: Does not use RW PTA. Remains unsteady with min LOB requiring light asssit to correct. Improved as he progressed, however then hip/back began to hurt with limp  +   Stairs Stairs: Yes Stairs assistance: Min assist Stair Management: No rails;One rail Right;Forwards Number of Stairs: 5 General stair comments: attempted no rail with slight unsteadiness; demonstrated how to hold wife's hand (and she hold his elbow in other hand)  Wheelchair Mobility    Modified Rankin (Stroke Patients Only)       Balance Overall balance assessment: Needs assistance Sitting-balance support: No upper extremity supported;Feet unsupported Sitting balance-Leahy Scale: Good     Standing balance support: No upper extremity supported Standing balance-Leahy Scale: Fair                      Cognition Arousal/Alertness: Awake/alert Behavior During Therapy: WFL for tasks assessed/performed Overall Cognitive Status: Within Functional Limits for tasks assessed                      Exercises      General Comments        Pertinent Vitals/Pain Pain Assessment: Faces Faces Pain Scale: Hurts even more Pain Location: Lt hip/back Pain Descriptors / Indicators: Guarding;Radiating Pain Intervention(s): Limited activity within patient's tolerance;Monitored during session    Home Living                      Prior Function            PT Goals (current goals can now be found in the care plan section) Acute Rehab PT Goals Patient Stated Goal: go home Time For Goal Achievement: 01/08/16 Progress towards PT goals: Progressing toward goals    Frequency    Min 3X/week  PT Plan Discharge plan needs to be updated    Co-evaluation             End of Session Equipment Utilized During Treatment: Gait belt Activity Tolerance: Patient tolerated treatment well Patient left: in chair;with call bell/phone within reach;with chair alarm set     Time: [phone] PT Time Calculation (min) (ACUTE ONLY): 13 min  Charges:  $Gait Training: 8-22 mins                    G Codes:      Amariz Flamenco 01-22-16, 9:43 AM  Pager  [phone]
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
## 2016-01-04 ENCOUNTER — Ambulatory Visit (INDEPENDENT_AMBULATORY_CARE_PROVIDER_SITE_OTHER): Payer: Medicare Other | Admitting: Student

## 2016-01-04 ENCOUNTER — Encounter: Payer: Self-pay | Admitting: Student

## 2016-01-04 VITALS — BP 150/70 | HR 46 | Temp 97.7°F | Ht 64.0 in | Wt 186.8 lb

## 2016-01-04 DIAGNOSIS — N184 Chronic kidney disease, stage 4 (severe): Secondary | ICD-10-CM

## 2016-01-04 DIAGNOSIS — E1142 Type 2 diabetes mellitus with diabetic polyneuropathy: Secondary | ICD-10-CM

## 2016-01-04 DIAGNOSIS — E1122 Type 2 diabetes mellitus with diabetic chronic kidney disease: Secondary | ICD-10-CM | POA: Diagnosis present

## 2016-01-04 DIAGNOSIS — M545 Low back pain: Secondary | ICD-10-CM

## 2016-01-04 DIAGNOSIS — I10 Essential (primary) hypertension: Secondary | ICD-10-CM | POA: Diagnosis not present

## 2016-01-04 DIAGNOSIS — G8929 Other chronic pain: Secondary | ICD-10-CM

## 2016-01-04 DIAGNOSIS — F411 Generalized anxiety disorder: Secondary | ICD-10-CM | POA: Diagnosis not present

## 2016-01-04 LAB — GLUCOSE, CAPILLARY: Glucose-Capillary: 186 mg/dL — ABNORMAL HIGH (ref 65–99)

## 2016-01-04 LAB — POCT GLYCOSYLATED HEMOGLOBIN (HGB A1C): Hemoglobin A1C: 8

## 2016-01-04 MED ORDER — ESCITALOPRAM OXALATE 10 MG PO TABS: 10.0000 mg | ORAL_TABLET | Freq: Every day | ORAL | 1 refills | Status: DC

## 2016-01-04 MED ORDER — GABAPENTIN 300 MG PO CAPS: 300.0000 mg | ORAL_CAPSULE | Freq: Two times a day (BID) | ORAL | 3 refills | Status: DC

## 2016-01-04 MED ORDER — CLONAZEPAM 0.5 MG PO TABS: 0.5000 mg | ORAL_TABLET | Freq: Two times a day (BID) | ORAL | 0 refills | Status: DC | PRN

## 2016-01-04 NOTE — Patient Instructions (Signed)
It was great seeing you today! We have addressed the following issues today  1. Blood pressure: your blood pressure is 150/70. Your goal blood pressure is less than 140/90. Continue amlodipine.  I recommend you follow up in one week.  2. Diabetes: continue your new prescription for Levemir and NovoLog 3. Neuropathy: I have reduced the gabapentin to 300 mg twice a day due to your kidney function 4. Anxiety: I refilled your prescription for Lexapro. I have also given a prescription for Klonopin. However, I'm worried that Klonopin can make you sedated and increase your risk of fall.     If we did any lab work today, and the results require attention, either me or my nurse will get in touch with you. If everything is normal, you will get a letter in mail. If you don't hear from [place] in two weeks, please give [place] a call. Otherwise, I look forward to talking with you again at our next visit. If you have any questions or concerns before then, please call the clinic at [phone].  Please bring all your medications to every doctors visit   Sign up for My Chart to have easy access to your labs results, and communication with your Primary care physician.    Please check-out at the front desk before leaving the clinic.   Take Care,

## 2016-01-04 NOTE — Progress Notes (Signed)
PO
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
## 2016-01-05 ENCOUNTER — Inpatient Hospital Stay: Payer: Medicare Other | Admitting: Student

## 2016-01-05 MED ORDER — GLUCOSE BLOOD VI STRP: ORAL_STRIP | 12 refills | Status: DC

## 2016-01-05 MED ORDER — LANCETS MISC: 0 refills | Status: DC

## 2016-01-05 MED ORDER — ACCU-CHEK AVIVA PLUS W/DEVICE KIT: 1.0000 | PACK | Freq: Three times a day (TID) | 0 refills | Status: DC

## 2016-01-05 NOTE — Assessment & Plan Note (Signed)
Refilled his clonazepam 0.5 mg twice a day as needed for anxiety/sleep. I discussed about my concern with this medication including sedation and increased risk of fall as well as addiction. The goal is to wean him off this medication. I also emphasized the importance of taking his Lexapro as prescribed.
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
## 2016-01-05 NOTE — Assessment & Plan Note (Signed)
Reduced his gabapentin to 300 mg twice a day due to his reduced GFR.

## 2016-01-05 NOTE — Assessment & Plan Note (Addendum)
No signs and symptoms of hypo-or hyperglycemia. Point-of-care glucose 186 today (this is about 3 hours after he ate his lunch). Will continue Levemir 38 units at bedtime and NovoLog 5 units 3 times a day. Patient to bring his glucose log sheets to his next visit.  Will consider introducing Ace inhibitors at low dose next visit if his blood pressure tolerates

## 2016-01-05 NOTE — Assessment & Plan Note (Addendum)
Initial blood pressure 164/69. Repeat blood pressure 150/70. We will continue amlodipine 5 mg daily.  I have collected his bottles of old blood pressure medications and handed to the nurse for disposal. Follow-up in one week. We may consider resuming lisinopril at low-dose dose given history of diabetes and CKD. Otherwise, patient without chest pain, dyspnea or orthopnea today. His exam is remarkable for trace edema bilaterally.

## 2016-01-08 ENCOUNTER — Ambulatory Visit: Payer: Medicare Other | Admitting: Student

## 2016-01-12 ENCOUNTER — Telehealth: Payer: Self-pay | Admitting: Student

## 2016-01-12 NOTE — Telephone Encounter (Signed)
FMLA form dropped off for at front desk for completion.  Verified that patient section of form has been completed.  Last DOS with PCP was 01-04-2016.  Placed form in team folder to be completed by clinical staff. Also, patient asks to fax form to [employer] at [phone].  Rosa A Charlies Constable

## 2016-01-15 NOTE — Telephone Encounter (Signed)
Clinical info completed on FMLA form.  Placed form in Dr. Juliann Pares box for completion. Please fax and also scan copy into pt chart once completed.  Katharina Caper, Christino Mcglinchey D, [place]

## 2016-01-16 ENCOUNTER — Encounter: Payer: Self-pay | Admitting: Physician Assistant

## 2016-01-16 ENCOUNTER — Ambulatory Visit: Payer: Medicare Other | Admitting: Student

## 2016-01-18 NOTE — Progress Notes (Signed)
Reviewed, completed, and signed form.  Note routed to RN team inbasket and placed completed form in Clinic RN's office (wall pocket above desk).  Taye T Gonfa, MD  

## 2016-01-18 NOTE — Telephone Encounter (Signed)
Received ppw from Dr. Cyndia Skeeters. Will fax to [facility] [phone]. Lattie Haw has been informed and will call [employer] to confirm receipt. Ottis Stain, CMA

## 2016-01-18 NOTE — Telephone Encounter (Signed)
Pt's daughter Lattie Haw called in to f/u on status of ppwk. Advised pt that according to the notes, ppwk was forwarded to PCP, by nurse, and awaiting completion.Lattie Haw requesting to have ppwk completed by deadline of 01/19/16.Marland Kitchen Requesting to have it faxed upon completion to [employer] at [phone]... Also requesting to p/u a paper copy, for her records.. States pt has an appt on 10/30, and she can p/u her copy at that time. Please assist.

## 2016-01-22 ENCOUNTER — Ambulatory Visit (INDEPENDENT_AMBULATORY_CARE_PROVIDER_SITE_OTHER): Payer: Medicare Other | Admitting: Student

## 2016-01-22 VITALS — BP 160/84 | HR 61 | Temp 97.8°F | Ht 64.0 in | Wt 175.6 lb

## 2016-01-22 DIAGNOSIS — E1122 Type 2 diabetes mellitus with diabetic chronic kidney disease: Secondary | ICD-10-CM

## 2016-01-22 DIAGNOSIS — F411 Generalized anxiety disorder: Secondary | ICD-10-CM | POA: Diagnosis not present

## 2016-01-22 DIAGNOSIS — M545 Low back pain: Secondary | ICD-10-CM

## 2016-01-22 DIAGNOSIS — G8929 Other chronic pain: Secondary | ICD-10-CM

## 2016-01-22 DIAGNOSIS — N184 Chronic kidney disease, stage 4 (severe): Secondary | ICD-10-CM | POA: Diagnosis not present

## 2016-01-22 DIAGNOSIS — M5440 Lumbago with sciatica, unspecified side: Secondary | ICD-10-CM | POA: Diagnosis not present

## 2016-01-22 DIAGNOSIS — I10 Essential (primary) hypertension: Secondary | ICD-10-CM

## 2016-01-22 DIAGNOSIS — Z0000 Encounter for general adult medical examination without abnormal findings: Secondary | ICD-10-CM

## 2016-01-22 LAB — BASIC METABOLIC PANEL WITH GFR
BUN: 61 mg/dL — ABNORMAL HIGH (ref 7–25)
CHLORIDE: 99 mmol/L (ref 98–110)
CO2: 20 mmol/L (ref 20–31)
Calcium: 9.6 mg/dL (ref 8.6–10.3)
Creat: 3.48 mg/dL — ABNORMAL HIGH (ref 0.70–1.25)
GFR, EST AFRICAN AMERICAN: 20 mL/min — AB (ref >=60)
GFR, EST NON AFRICAN AMERICAN: 17 mL/min — AB (ref >=60)
Glucose, Bld: 244 mg/dL — ABNORMAL HIGH (ref 65–99)
POTASSIUM: 4.7 mmol/L (ref 3.5–5.3)
SODIUM: 133 mmol/L — AB (ref 135–146)

## 2016-01-22 MED ORDER — LISINOPRIL 5 MG PO TABS: 5.0000 mg | ORAL_TABLET | Freq: Every day | ORAL | 3 refills | Status: DC

## 2016-01-22 MED ORDER — INSULIN DETEMIR 100 UNIT/ML ~~LOC~~ SOLN: 40.0000 [IU] | Freq: Every day | SUBCUTANEOUS | 11 refills | Status: DC

## 2016-01-22 MED ORDER — TETANUS-DIPHTH-ACELL PERTUSSIS 5-2.5-18.5 LF-MCG/0.5 IM SUSP: 0.5000 mL | Freq: Once | INTRAMUSCULAR | 0 refills | Status: AC

## 2016-01-22 MED ORDER — ZOSTER VACCINE LIVE 19400 UNT/0.65ML ~~LOC~~ SUSR: 0.6500 mL | Freq: Once | SUBCUTANEOUS | 0 refills | Status: AC

## 2016-01-22 MED ORDER — OXYCODONE-ACETAMINOPHEN 5-325 MG PO TABS: 1.0000 | ORAL_TABLET | Freq: Three times a day (TID) | ORAL | 0 refills | Status: DC | PRN

## 2016-01-22 MED ORDER — CLONAZEPAM 0.5 MG PO TABS: 0.5000 mg | ORAL_TABLET | Freq: Two times a day (BID) | ORAL | 0 refills | Status: DC | PRN

## 2016-01-22 NOTE — Patient Instructions (Addendum)
It was great seeing you today! We have addressed the following issues today  1. Blood pressure: Your goal blood pressure is less than 140/90. I have added a new medication called Lisinopril. Please pick up this prescription and start taking it today. I recommend you come back and see [place] in 2 weeks. 2  Diabetes: I have increased your Levemir to 40 units twice a day. Please use the logbook we gave you to recur your blood sugar and bring to your next visit in 2 weeks.  3. Anxiety: Continue taking Lexapro. Please be cautious with Klonopin especially with the pain medicine. Take it only when you badly need it.   4. Back pain: I refilled a prescription for Percocet today. Take this medication only for severe pain.   5. Please take the medication list I gave you today to your pharmacy to ensure that you at getting the right medications.     If we did any lab work today, and the results require attention, either me or my nurse will get in touch with you. If everything is normal, you will get a letter in mail. If you don't hear from [place] in two weeks, please give [place] a call. Otherwise, we look forward to seeing you again at your next visit. If you have any questions or concerns before then, please call the clinic at [phone].   Please bring all your medications to every doctors visit   Sign up for My Chart to have easy access to your labs results, and communication with your Primary care physician.     Please check-out at the front desk before leaving the clinic.    Take Care,

## 2016-01-22 NOTE — Progress Notes (Signed)
Subjective:    Patient ID: Joseph Mcdonald is a 65 y.o. old male. Patient is here with his wife for follow-up on diabetes and hypertension. He also like to discuss about chronic back pain HPI #Hypertension:not controlled. Intermittently checks his blood pressure at home using a wrist cuff. Wife stated that his blood pressure was a letter over 200/100 one day and she had to give him her lisinopril 10 mg once. She is only on amlodipine 5 mg daily. He denies chest pain, shortness of breath or orthopnea. She reports good compliance with amlodipine.   #Diabetes: improving. He is on NovoLog and Levemir. He denies symptoms of hypo-or hyperglycemia. Denies fever, chills or skin lesion. Reports good compliance with his insulins. Fasting blood glucose ranges from 119-288. Prelunch blood glucose ranges from 68-224. Pre-dinner blood glucose ranges from 146-313. Bedtime blood glucose ranges from 160-306.   #Anxiety: improving. Feels anxious almost everyday usually from noon to evening. Worries about not being able to be in  control of things. Not being able to get around due to transportation. They recently moved into a mobile house. He is not happy with the new place because it does have enough space get around (outside). He stated that he lives close to a road with high-traffic which makes it hard to get around. He is on Lexapro and reports good compliance. He feels he needs Klonopin when his anxiety is severe.   #Chronic back pain: Reports a pulling sensation in his lower back when he tries to get up from sitting. This happened three times this morning. Reports having similar back pain every day. Denies radiation. Reports getting on his left leg due to pain. Reports numbness and tingling in his legs. He has neuropathy and takes gabapentin. He denies fever, chills, saddle anesthesia, urinary or fecal incontinence, fatigue or unintentional weight loss.  Patient gets his medications from [facility].  PMH: reviewed  SH: Never a smoker. Denies alcohol and recreational drug use.   Review of Systems Per HPI Objective:   Vitals:   01/22/16 1352 01/22/16 1506  BP: (!) 175/78 (!) 160/84  Pulse: 61   Temp: 97.8 F (36.6 C)   TempSrc: Oral   Weight: 175 lb 9.6 oz (79.7 kg)   Height: 5\' 4"  (1.626 m)     GEN: appears well, no apparent distress. CVS: RRR, normal s1 and s2, no murmurs, no edema, dorsalis pedis pulses 1+ bilaterally RESP: no increased work of breathing, good air movement bilaterally, no crackles or wheeze GI: Bowel sounds present and normal, soft, non-tender,non-distended.  MSK: Tenderness to palpation over lumbar spines and paraspinal muscles. No apparent swelling overlying skin changes. SKIN: No apparent skin lesion. No foot ulcer. Toenails appropriately trimmed. NEURO: alert and oriented appropriately, motor 5 out of 5 in both extremities. Sensation intact in all dermatomes. A 7 point  monofilament exam within normal limits PSYCH: appropriate mood and affect     Assessment & Plan:  Essential hypertension Not at goal. He was taken off most of his blood pressure medications during recent admission out of concern for hypotension. He was discharged on amlodipine 5 mg daily. His blood pressure is 160/84 today. I have added lisinopril 5 mg daily. This could also protect his kidneys from diabetes. Obtained BMP as well. Advised to check his blood pressure intermittently at pharmacy. Follow-up in 2 weeks. We will assess his blood pressure and repeat BMP at that time.   Type 2 diabetes mellitus ([place]) Improving. Last A1c 8  on 01/04/2016. He brought his home blood glucose log to this visit. He has one low blood sugar to 68 over the last 3 weeks. He denies symptoms of hyperglycemia or hypoglycemia.  He denies fever, chills or skin lesion. Based on his blood glucose log I decided to increase his Levemir from 38 units twice a day to 40 units twice a day.  A 7 point  monofilament exam was within normal limits. He has 1+ dorsalis pedis pulses bilaterally. He has no history of claudication. He has never smoked. -urine microalbumin to creatinine ratio -Follow up in 2 weeks  Generalized anxiety disorder Improving. Continues to feel anxious. Recently about not being able to be in control of things. He reports good compliance with Lexapro. He still uses his Klonopin. Discussed my concern about his Klonopin especially with his pain medicine. This could impair him and increases his risk of fall. Patient voiced understanding. The ultimate goal is to get him off this medication and continue with Lexapro. I gave him prescription for Klonopin 0.5 mg, #20 to use it up to twice a day as needed for anxiety. He agrees to use this only if he needs it badly. I have also discussed about conservative measures such as deep breathing and relaxation techniques.  Chronic back pain Unchanged from previous. No red flags. Signed pain contract today. Obtain UDS as well.  -Gave prescription for Percocet 5/325 mg, #60 with a direction to take up to 3 times a day as needed for severe pain. Discussed about side effects including sedation and increased risk of fall, especially with Klonopin.   Routine adult health maintenance Diabetic foot exam within normal limits except for 1+ DP/PT pulses bilaterally.  Gave prescription for Tdap and Zostavax He is resistant to colonoscopy. I will discuss this again next visit.  Encouraged him to see an eye doctor

## 2016-01-23 DIAGNOSIS — Z0000 Encounter for general adult medical examination without abnormal findings: Secondary | ICD-10-CM | POA: Insufficient documentation

## 2016-01-23 LAB — MICROALBUMIN / CREATININE URINE RATIO
Creatinine, Urine: 91 mg/dL (ref 20–370)
MICROALB UR: 198.5 mg/dL
MICROALB/CREAT RATIO: 2181 ug/mg{creat} — AB (ref <30)

## 2016-01-23 LAB — PAIN MGMT, PROFILE 5 W/CONF, U
AMPHETAMINES: NEGATIVE ng/mL (ref <500)
BARBITURATES: NEGATIVE ng/mL (ref <300)
BENZODIAZEPINES: NEGATIVE ng/mL (ref <100)
COCAINE METABOLITE: NEGATIVE ng/mL (ref <150)
CREATININE: 78.2 mg/dL (ref >=20.0)
MARIJUANA METABOLITE: NEGATIVE ng/mL (ref <20)
Methadone Metabolite: NEGATIVE ng/mL (ref <100)
OPIATES: NEGATIVE ng/mL (ref <100)
OXIDANT: NEGATIVE ug/mL (ref <200)
Oxycodone: NEGATIVE ng/mL (ref <100)
PH: 5.97 (ref 4.5–9.0)

## 2016-01-23 NOTE — Assessment & Plan Note (Signed)
Improving. Last A1c 8 on 01/04/2016. He brought his home blood glucose log to this visit. He has one low blood sugar to 68 over the last 3 weeks. He denies symptoms of hyperglycemia or hypoglycemia.  He denies fever, chills or skin lesion. Based on his blood glucose log I decided to increase his Levemir from 38 units twice a day to 40 units twice a day.  A 7 point monofilament exam was within normal limits. He has 1+ dorsalis pedis pulses bilaterally. He has no history of claudication. He has never smoked. -urine microalbumin to creatinine ratio -Follow up in 2 weeks

## 2016-01-23 NOTE — Assessment & Plan Note (Signed)
Diabetic foot exam within normal limits except for 1+ DP/PT pulses bilaterally.  Gave prescription for Tdap and Zostavax He is resistant to colonoscopy. I will discuss this again next visit.  Encouraged him to see an eye doctor

## 2016-01-23 NOTE — Assessment & Plan Note (Signed)
Improving. Continues to feel anxious. Recently about not being able to be in control of things. He reports good compliance with Lexapro. He still uses his Klonopin. Discussed my concern about his Klonopin especially with his pain medicine. This could impair him and increases his risk of fall. Patient voiced understanding. The ultimate goal is to get him off this medication and continue with Lexapro. I gave him prescription for Klonopin 0.5 mg, #20 to use it up to twice a day as needed for anxiety. He agrees to use this only if he needs it badly. I have also discussed about conservative measures such as deep breathing and relaxation techniques.

## 2016-01-23 NOTE — Assessment & Plan Note (Signed)
Not at goal. He was taken off most of his blood pressure medications during recent admission out of concern for hypotension. He was discharged on amlodipine 5 mg daily. His blood pressure is 160/84 today. I have added lisinopril 5 mg daily. This could also protect his kidneys from diabetes. Obtained BMP as well. Advised to check his blood pressure intermittently at pharmacy. Follow-up in 2 weeks. We will assess his blood pressure and repeat BMP at that time.

## 2016-01-23 NOTE — Assessment & Plan Note (Signed)
Unchanged from previous. No red flags. Signed pain contract today. Obtain UDS as well.  -Gave prescription for Percocet 5/325 mg, #60 with a direction to take up to 3 times a day as needed for severe pain. Discussed about side effects including sedation and increased risk of fall, especially with Klonopin.

## 2016-01-29 ENCOUNTER — Other Ambulatory Visit: Payer: Self-pay | Admitting: *Deleted

## 2016-01-29 DIAGNOSIS — E1142 Type 2 diabetes mellitus with diabetic polyneuropathy: Secondary | ICD-10-CM

## 2016-01-29 MED ORDER — GABAPENTIN 300 MG PO CAPS: 300.0000 mg | ORAL_CAPSULE | Freq: Two times a day (BID) | ORAL | 3 refills | Status: DC

## 2016-01-30 ENCOUNTER — Encounter: Payer: Medicare Other | Admitting: Physician Assistant

## 2016-01-30 NOTE — Progress Notes (Deleted)
Cardiology Office Note:    Date:  01/30/2016   ID:  Joseph Mcdonald, DOB [birth year], MRN [id number]  PCP:  Mercy Riding, MD  Cardiologist:  ***  Electrophysiologist:  ***  Referring MD: Mercy Riding, MD   No chief complaint on file. ***  History of Present Illness:    Joseph Mcdonald is a 65 y.o. male with a hx of CAD status post CABG in 2011, CKD, DM, HTN, HL, alcoholism. Last seen in the office by Dr. Harrington Challenger 12/14.  He was admitted 10/3-10/10 with evidence of hypotensive shock. He presented with altered mental status after a syncopal event with a blood pressure 71/61 and lactic acidosis as well as acute on chronic renal failure. Etiology was thought to be dehydration in the setting of ACE inhibitor therapy. He was treated with IV fluids and vasopressors. Chest x-ray did demonstrate left lower lobe pneumonia thought to be related to aspiration. He was followed by CCM and treated with antibiotics. Cardiology was asked to see the patient secondary to an abnormal ECG. ECG changes were consistent with global ischemia thought to be related to hypotension. He ruled in for non-STEMI.  He did require IV Lasix for volume overload after IV fluid resuscitation. The patient was thought to be high risk for contrast induced nephropathy 2/2 CKD. Elevated troponins were overall felt to be related to demand ischemia. Echocardiogram demonstrated normal LV function. He had moderate diastolic dysfunction and moderate left atrial enlargement. Nuclear perfusion study demonstrated no ischemia. EF was 40% and study was read as intermediate risk. However, this was felt to be incorrect as his EF was normal on echocardiogram. Medical therapy was continued.   He returns for follow-up.  ***  Prior CV studies that were reviewed today include:    Myoview 01/01/16 No ischemia, EF 30-44%; intermediate risk secondary to reduced systolic function  Echo [id number] Moderate concentric LVH, EF 55-60, normal wall  motion, grade 2 diastolic dysfunction, calcified aortic valve (mean gradient 10 mmHg), moderate LAE  Past Medical History:  Diagnosis Date  . Anxiety   . Depression   . Diabetes mellitus without complication ([place])   . Hypertension   . Peripheral neuropathy [facility])     Past Surgical History:  Procedure Laterality Date  . CORONARY ARTERY BYPASS GRAFT      Current Medications: No outpatient prescriptions have been marked as taking for the 01/30/16 encounter (Appointment) with Liliane Shi, PA-C.     Allergies:   Penicillins   Social History   Social History  . Marital status: Married    Spouse name: N/A  . Number of children: N/A  . Years of education: N/A   Occupational History  . Disabled/Retired    Social History Main Topics  . Smoking status: Never Smoker  . Smokeless tobacco: Never Used  . Alcohol use No  . Drug use: No  . Sexual activity: Not on file   Other Topics Concern  . Not on file   Social History Narrative   Pt lives in [place] with wife.  He  Has 13 children.  He is disabled from [place].     Family History:  The patient's ***family history includes Heart attack in his brother, brother, and father.   ROS:   Please see the history of present illness.    ROS All other systems reviewed and are negative.   EKGs/Labs/Other Test Reviewed:    EKG:  EKG is *** ordered today.  The ekg ordered today demonstrates ***  Recent Labs: 12/26/2015: Magnesium 1.7; TSH 1.544 12/28/2015: ALT 14; B Natriuretic Peptide 1,057.5 01/02/2016: Hemoglobin 9.2; Platelets 242 01/22/2016: BUN 61; Creat 3.48; Potassium 4.7; Sodium 133   Recent Lipid Panel    Component Value Date/Time   CHOL 180 12/25/2015 1429   TRIG 255 (H) 12/25/2015 1429   HDL 48 12/25/2015 1429   CHOLHDL 3.8 12/25/2015 1429   VLDL 51 (H) 12/25/2015 1429   LDLCALC 81 12/25/2015 1429     Physical Exam:    VS:  There were no vitals taken for this visit.    Wt Readings from Last 3  Encounters:  01/22/16 175 lb 9.6 oz (79.7 kg)  01/04/16 186 lb 12.8 oz (84.7 kg)  01/02/16 182 lb 11.2 oz (82.9 kg)     ***Physical Exam  ASSESSMENT:    No diagnosis found. PLAN:    In order of problems listed above:  1. CAD - s/p CABG in 2011.  Recent admission with hypovolemic shock and possible sepsis in the setting of pneumonia. He had elevated troponins and ECG changes consistent with non-STEMI. However, elevated troponin was felt to be related to demand ischemia. EF was normal on echocardiogram. Inpatient nuclear study demonstrated no ischemia. Medical therapy was recommended given chronic kidney disease and high risk for contrast induced nephropathy.  *** 2. CKD - *** 3. Hyperlipidemia - *** 4. Hypertension - ***   Medication Adjustments/Labs and Tests Ordered: Current medicines are reviewed at length with the patient today.  Concerns regarding medicines are outlined above.  Medication changes, Labs and Tests ordered today are outlined in the Patient Instructions noted below. There are no Patient Instructions on file for this visit. Signed, Richardson Dopp, PA-C  01/30/2016 8:28 AM    [facility] [place], [place], [place]  [address] Phone: [phone]; Fax: [phone]

## 2016-02-05 ENCOUNTER — Ambulatory Visit (INDEPENDENT_AMBULATORY_CARE_PROVIDER_SITE_OTHER): Payer: Medicare Other | Admitting: Student

## 2016-02-05 DIAGNOSIS — I10 Essential (primary) hypertension: Secondary | ICD-10-CM

## 2016-02-05 DIAGNOSIS — E1122 Type 2 diabetes mellitus with diabetic chronic kidney disease: Secondary | ICD-10-CM | POA: Diagnosis not present

## 2016-02-05 DIAGNOSIS — F411 Generalized anxiety disorder: Secondary | ICD-10-CM | POA: Diagnosis not present

## 2016-02-05 DIAGNOSIS — I2510 Atherosclerotic heart disease of native coronary artery without angina pectoris: Secondary | ICD-10-CM | POA: Diagnosis present

## 2016-02-05 DIAGNOSIS — N184 Chronic kidney disease, stage 4 (severe): Secondary | ICD-10-CM

## 2016-02-05 MED ORDER — LISINOPRIL 5 MG PO TABS: 5.0000 mg | ORAL_TABLET | Freq: Every day | ORAL | 0 refills | Status: DC

## 2016-02-05 MED ORDER — CLONAZEPAM 0.5 MG PO TABS: 0.5000 mg | ORAL_TABLET | Freq: Two times a day (BID) | ORAL | 0 refills | Status: DC | PRN

## 2016-02-05 NOTE — Patient Instructions (Signed)
It was great seeing you today! We have addressed the following issues today  1. Blood pressure: your goal blood pressure is less than 140/90. Please fill the prescription for lisinopril and start taking. I will send the prescription for blood pressure machine to your pharmacy. Please return in two weeks for follow up.   2. Diabetes: please bring your blood glucose numbers to your visit.   3. Anxiety: I recommend deep breathing and relaxation. I also recommend taking the Lexapro daily. Take Klonopin only if you badly need it.    If we did any lab work today, and the results require attention, either me or my nurse will get in touch with you. If everything is normal, you will get a letter in mail. If you don't hear from [place] in two weeks, please give [place] a call. Otherwise, we look forward to seeing you again at your next visit. If you have any questions or concerns before then, please call the clinic at [phone].   Please bring all your medications to every doctors visit   Sign up for My Chart to have easy access to your labs results, and communication with your Primary care physician.     Please check-out at the front desk before leaving the clinic.    Generalized Anxiety Disorder Generalized anxiety disorder (GAD) is a mental disorder. It interferes with life functions, including relationships, work, and school. GAD is different from normal anxiety, which everyone experiences at some point in their lives in response to specific life events and activities. Normal anxiety actually helps [place] prepare for and get through these life events and activities. Normal anxiety goes away after the event or activity is over.  GAD causes anxiety that is not necessarily related to specific events or activities. It also causes excess anxiety in proportion to specific events or activities. The anxiety associated with GAD is also difficult to control. GAD can vary from mild to severe. People with severe GAD  can have intense waves of anxiety with physical symptoms (panic attacks).  SYMPTOMS The anxiety and worry associated with GAD are difficult to control. This anxiety and worry are related to many life events and activities and also occur more days than not for 6 months or longer. People with GAD also have three or more of the following symptoms (one or more in children):  Restlessness.   Fatigue.  Difficulty concentrating.   Irritability.  Muscle tension.  Difficulty sleeping or unsatisfying sleep. DIAGNOSIS GAD is diagnosed through an assessment by your health care provider. Your health care provider will ask you questions aboutyour mood,physical symptoms, and events in your life. Your health care provider may ask you about your medical history and use of alcohol or drugs, including prescription medicines. Your health care provider may also do a physical exam and blood tests. Certain medical conditions and the use of certain substances can cause symptoms similar to those associated with GAD. Your health care provider may refer you to a mental health specialist for further evaluation. TREATMENT The following therapies are usually used to treat GAD:   Medication. Antidepressant medication usually is prescribed for long-term daily control. Antianxiety medicines may be added in severe cases, especially when panic attacks occur.   Talk therapy (psychotherapy). Certain types of talk therapy can be helpful in treating GAD by providing support, education, and guidance. A form of talk therapy called cognitive behavioral therapy can teach you healthy ways to think about and react to daily life events and activities.  Stress managementtechniques. These include yoga, meditation, and exercise and can be very helpful when they are practiced regularly. A mental health specialist can help determine which treatment is best for you. Some people see improvement with one therapy. However, other people  require a combination of therapies.   This information is not intended to replace advice given to you by your health care provider. Make sure you discuss any questions you have with your health care provider.   Document Released: 07/06/2012 Document Revised: 04/01/2014 Document Reviewed: 07/06/2012 Elsevier Interactive Patient Education 2016 [place],

## 2016-02-06 NOTE — Assessment & Plan Note (Signed)
His anxiety is a combination of life stress, poor compliance with his Lexapro and poor coping mechanism.  His daughter is working on finding him a place. The area looking into apartments. I emphasized the importance of compliance with his Lexapro and other medications. I also discouraged them from sharing medications. gave him prescription for Klonopin 0.5 mg, #20 to take up to twice a day as needed for anxiety. I have also discussed about nonpharmacological intervention including deep breathing and relaxation technique and gave him handout.

## 2016-02-06 NOTE — Assessment & Plan Note (Signed)
Uncontrolled likely due to poor compliance. He was supposed to be on lisinopril 5 mg but did not pick this prescription from the pharmacy. I also doubt if he is taking his amlodipine.  His living condition has a significant contribution to this. Some of his medication are at his stepdaughter's house and the others are with his other daughter.  -I gave him a paper prescription for lisinopril to take it to the pharmacy -Emphasized the importance of compliance with his medication -Return in 2 weeks. We will check BMP at that time -I will order him a regular BP cuff for home blood pressure monitoring

## 2016-02-06 NOTE — Progress Notes (Signed)
Subjective:    Patient ID: Joseph Mcdonald is a 65 y.o. old male. Patient here to discuss about his anxiety. He is accompanied to this visit by his daughter HPI #Anxiety: Stable. Patient reports going through a difficult time. Most 2 mobile house few months ago and didn't like it. Then he moved to his friend's house we didn't work either . Currently he lives with his daughter who accompanied him to this visit. He worries about not being able to be in control of things. Not being able to get around due to transportation. He is on Lexapro but not compliant. He stated that some of his medications with his stepdaughter. He is currently with his other daughter. I gave him Klonopin 0.5 mg, #20, to take 2 times a day as needed for anxiety about 13 days ago. He reports taking his last dose yesterday. His daughter reports giving him his Xanax to calm him down.   #Hypertension: Uncontrolled. it is unfortunate that he did not pick his prescription for lisinopril from 2 weeks ago. I'm also concerned about his compliance with medication especially with his living condition. He states that some of his medication are at his stepdaughter's house and the others are with his other daughter.Marland Kitchen He did not bring his medication bottles to this visit. Unsure if he is even taking his amlodipine at this point. He has a wrist cuff for blood pressure check at home. His daughter is asking if he can get a regular cuff  #Diabetes: Improving. Reports compliance with his insulin. Unfortunately he did not bring his blood glucose log to this visit. I gave him a blood glucose log last visit, 2 weeks ago. He does not remember his blood glucose reading either although he reports checking his blood glucose every day. He denies symptoms of hyper or hypoglycemia.   PMH: reviewed  SH: Never a smoker. Denies alcohol and recreational drug use.   Review of Systems Per HPI Objective:   Vitals:   02/05/16 1417 02/05/16 1446  BP:  (!) 175/85 (!) 175/77  Pulse:  62     GEN: appears well, no apparent distress. CVS: RRR, normal s1 and s2, no murmurs, trace edema, dorsalis pedis pulses 1+ bilaterally RESP: no increased work of breathing, good air movement bilaterally, no crackles or wheeze GI: Bowel sounds present and normal, soft, non-tender,non-distended.  SKIN: No apparent skin lesion. NEURO: alert and oriented appropriately, grossly intact PSYCH: appropriate mood and affect    Assessment & Plan:  Generalized anxiety disorder His anxiety is a combination of life stress, poor compliance with his Lexapro and poor coping mechanism.  His daughter is working on finding him a place. The area looking into apartments. I emphasized the importance of compliance with his Lexapro and other medications. I also discouraged them from sharing medications. gave him prescription for Klonopin 0.5 mg, #20 to take up to twice a day as needed for anxiety. I have also discussed about nonpharmacological intervention including deep breathing and relaxation technique and gave him handout.  Essential hypertension Uncontrolled likely due to poor compliance. He was supposed to be on lisinopril 5 mg but did not pick this prescription from the pharmacy. I also doubt if he is taking his amlodipine.  His living condition has a significant contribution to this. Some of his medication are at his stepdaughter's house and the others are with his other daughter.  -I gave him a paper prescription for lisinopril to take it to the pharmacy -Emphasized the importance  of compliance with his medication -Return in 2 weeks. We will check BMP at that time -I will order him a regular BP cuff for home blood pressure monitoring  Type 2 diabetes mellitus ([facility]) Last A1c 8. I think he could benefit from goal A1c of 8 given his comorbidity. Unfortunately didn't bring his blood glucose numbers to this visit. -Gave him blood glucose log again -No changes to his  medication -Follow up in 2 weeks

## 2016-02-06 NOTE — Assessment & Plan Note (Addendum)
Last A1c 8. I think he could benefit from goal A1c of 8 given his comorbidity. Unfortunately didn't bring his blood glucose numbers to this visit. -Gave him blood glucose log again -No changes to his medication -Follow up in 2 weeks

## 2016-02-14 ENCOUNTER — Encounter: Payer: Self-pay | Admitting: Physician Assistant

## 2016-02-19 ENCOUNTER — Encounter: Payer: Self-pay | Admitting: Student

## 2016-02-19 ENCOUNTER — Ambulatory Visit (INDEPENDENT_AMBULATORY_CARE_PROVIDER_SITE_OTHER): Payer: Medicare Other | Admitting: Student

## 2016-02-19 VITALS — BP 170/70 | HR 71 | Temp 98.3°F | Resp 16 | Wt 173.2 lb

## 2016-02-19 DIAGNOSIS — N184 Chronic kidney disease, stage 4 (severe): Secondary | ICD-10-CM

## 2016-02-19 DIAGNOSIS — I10 Essential (primary) hypertension: Secondary | ICD-10-CM

## 2016-02-19 DIAGNOSIS — E1122 Type 2 diabetes mellitus with diabetic chronic kidney disease: Secondary | ICD-10-CM

## 2016-02-19 DIAGNOSIS — G8929 Other chronic pain: Secondary | ICD-10-CM

## 2016-02-19 DIAGNOSIS — E1142 Type 2 diabetes mellitus with diabetic polyneuropathy: Secondary | ICD-10-CM | POA: Diagnosis not present

## 2016-02-19 DIAGNOSIS — I2510 Atherosclerotic heart disease of native coronary artery without angina pectoris: Secondary | ICD-10-CM | POA: Diagnosis not present

## 2016-02-19 DIAGNOSIS — M5440 Lumbago with sciatica, unspecified side: Secondary | ICD-10-CM | POA: Diagnosis not present

## 2016-02-19 DIAGNOSIS — F411 Generalized anxiety disorder: Secondary | ICD-10-CM

## 2016-02-19 LAB — BASIC METABOLIC PANEL WITH GFR
BUN: 32 mg/dL — AB (ref 7–25)
CALCIUM: 8.8 mg/dL (ref 8.6–10.3)
CO2: 21 mmol/L (ref 20–31)
Chloride: 106 mmol/L (ref 98–110)
Creat: 2.81 mg/dL — ABNORMAL HIGH (ref 0.70–1.25)
GFR, EST AFRICAN AMERICAN: 26 mL/min — AB (ref >=60)
GFR, Est Non African American: 22 mL/min — ABNORMAL LOW (ref >=60)
GLUCOSE: 78 mg/dL (ref 65–99)
POTASSIUM: 4.4 mmol/L (ref 3.5–5.3)
Sodium: 138 mmol/L (ref 135–146)

## 2016-02-19 MED ORDER — CLONAZEPAM 0.5 MG PO TABS: 0.5000 mg | ORAL_TABLET | Freq: Two times a day (BID) | ORAL | 0 refills | Status: DC | PRN

## 2016-02-19 MED ORDER — ESCITALOPRAM OXALATE 10 MG PO TABS: 10.0000 mg | ORAL_TABLET | Freq: Every day | ORAL | 1 refills | Status: DC

## 2016-02-19 MED ORDER — AMLODIPINE BESYLATE 10 MG PO TABS: 10.0000 mg | ORAL_TABLET | Freq: Two times a day (BID) | ORAL | 0 refills | Status: DC

## 2016-02-19 MED ORDER — OXYCODONE-ACETAMINOPHEN 5-325 MG PO TABS: 1.0000 | ORAL_TABLET | Freq: Three times a day (TID) | ORAL | 0 refills | Status: DC | PRN

## 2016-02-19 MED ORDER — GABAPENTIN 300 MG PO CAPS: 300.0000 mg | ORAL_CAPSULE | Freq: Two times a day (BID) | ORAL | 3 refills | Status: DC

## 2016-02-19 NOTE — Patient Instructions (Addendum)
It was great seeing you today! We have addressed the following issues today  1. Your blood pressure is 170/70. Your goal blood pressure is less than 140/90. I have increased his amlodipine to 10 mg daily. You can take 2 of the 5 mg tablets and then peak your new prescription when you ran out. Please come back and see [place] in one month.  2.  Diabetes: Place to stop doing the sliding scale. Inject 40 units of Levemir at bedtime, and 5 units of NovoLog 3 times a day 15 minutes before each meals. Please check your blood sugar 4 times a day (before each meals and at bedtime). Please write down your blood sugar numbers and bring to your next visit. Follow up in 4 weeks.   3.  Anxiety: I have refilled his Klonopin today. Please take it only if you badly need it. Continue taking your Lexapro.     If we did any lab work today, and the results require attention, either me or my nurse will get in touch with you. If everything is normal, you will get a letter in mail. If you don't hear from [place] in two weeks, please give [place] a call. Otherwise, we look forward to seeing you again at your next visit. If you have any questions or concerns before then, please call the clinic at [phone].   Please bring all your medications to every doctors visit   Sign up for My Chart to have easy access to your labs results, and communication with your Primary care physician.     Please check-out at the front desk before leaving the clinic.    Take Care,

## 2016-02-19 NOTE — Progress Notes (Signed)
   Subjective:    Patient ID: Joseph Mcdonald is a 65 y.o. old male.  HPI #Diabetes: brought his blood glucose numbers to this visit. His fasting blood glucose ranges from 80's to upper 200's. He reports using sliding scale Novolog instead of 5 units three times a day. He says he has been doing this for long time as recommended by previous PCP. His wife reports symptoms of hypoglycemia such as diaphoresis, feeling anxious when his blood glucose was in 80's. Denies symptoms of hyperglycemia. Recently started on ACEi about two weeks ago. Reports tolerating this.   #Hypertension: brought his medication bottles to this visit. Reports good compliance with his medication this time. He denies chest pain, shortness of breath, headache, vision changes or other neurologic symptoms.   PMH: reviewed   Review of Systems Per HPI Objective:   Vitals:   02/19/16 1400 02/19/16 1428  BP: (!) 173/71 (!) 170/70  Pulse: 71   Resp: 16   Temp: 98.3 F (36.8 C)   TempSrc: Oral   Weight: 78.6 kg (173 lb 3.2 oz)     GEN: appears well, no apparent distress. CVS: RRR, normal s1 and s2, no murmurs, trace edema RESP: no increased work of breathing, good air movement bilaterally, no crackles or wheeze GI: Bowel sounds present and normal, soft, non-tender,non-distended SKIN: no apparent lesion NEURO: alert and oriented appropriately, no gross defecits  PSYCH: appropriate mood and affect     Assessment & Plan:  Type 2 diabetes mellitus (HCC) Continue Levemir at 40 units daily Novolog 5 units three times a day with meals. No SSI Continue CBG 4 times a day (before meals and at bedtime) Encourage to have regular meals around the clock. Follow up in one month. Advised to bring his blood glucose numbers to that visit   Essential hypertension Not controlled. Repeat BP remained high at 170/70. Increased his Amlodipine to 10 mg daily Continue Lisinopril at 5 mg for now BMP today If BMP stable and  BP stays high, will increase his lisinopril to 10 mg next.   Chronic back pain No acute changes. No red flags. He started walking on treadmill. No fall history. Refilled his percocet 5/325 mg three times a day for pain. #90  Generalized anxiety disorder It continues to be a challenge to get him off benzo's. He says he was out of his Lexapro and couldn't get a refill on it when he called the pharmacy. We have never received refill request from his pharmacy. Refilled his Lexapro today. Gave prescription for Klonopin 0.5 mg twice a day for anxiety, #20. Stressed my concern about this medication with his percocet again. Advised him to use only if he badly needs it. The ultimate goal is to librate him from benzo.

## 2016-02-20 ENCOUNTER — Encounter: Payer: Self-pay | Admitting: Student

## 2016-02-20 ENCOUNTER — Other Ambulatory Visit: Payer: Self-pay | Admitting: *Deleted

## 2016-02-20 DIAGNOSIS — M109 Gout, unspecified: Secondary | ICD-10-CM | POA: Insufficient documentation

## 2016-02-20 LAB — HM DIABETES EYE EXAM

## 2016-02-20 MED ORDER — ALLOPURINOL 100 MG PO TABS: 100.0000 mg | ORAL_TABLET | Freq: Every day | ORAL | 2 refills | Status: DC

## 2016-02-20 NOTE — Assessment & Plan Note (Signed)
Not controlled. Repeat BP remained high at 170/70. Increased his Amlodipine to 10 mg daily Continue Lisinopril at 5 mg for now BMP today If BMP stable and BP stays high, will increase his lisinopril to 10 mg next.

## 2016-02-20 NOTE — Assessment & Plan Note (Signed)
No acute changes. No red flags. He started walking on treadmill. No fall history. Refilled his percocet 5/325 mg three times a day for pain. #90

## 2016-02-20 NOTE — Assessment & Plan Note (Addendum)
It continues to be a challenge to get him off benzo's. He says he was out of his Lexapro and couldn't get a refill on it when he called the pharmacy. We have never received refill request from his pharmacy. Refilled his Lexapro today. Gave prescription for Klonopin 0.5 mg twice a day for anxiety, #20. Stressed my concern about this medication with his percocet again. Advised him to use only if he badly needs it. The ultimate goal is to librate him from benzo.

## 2016-02-20 NOTE — Assessment & Plan Note (Addendum)
Continue Levemir at 40 units daily Novolog 5 units three times a day with meals. No SSI Continue CBG 4 times a day (before meals and at bedtime) Encourage to have regular meals around the clock. Follow up in one month. Advised to bring his blood glucose numbers to that visit

## 2016-02-20 NOTE — Progress Notes (Signed)
Result letter on BMP. Creatinine improving.

## 2016-03-11 ENCOUNTER — Telehealth: Payer: Self-pay | Admitting: *Deleted

## 2016-03-11 DIAGNOSIS — M5440 Lumbago with sciatica, unspecified side: Principal | ICD-10-CM

## 2016-03-11 DIAGNOSIS — G8929 Other chronic pain: Secondary | ICD-10-CM

## 2016-03-11 DIAGNOSIS — F411 Generalized anxiety disorder: Secondary | ICD-10-CM

## 2016-03-11 NOTE — Telephone Encounter (Signed)
Patient wife wanted to know if MD could write rx's for percocet and klonopin so she can pick them up when she's here Wednesday. Patient states they are due to be filled next week but she just wanted to have them so he would not be out for the holiday.

## 2016-03-13 MED ORDER — CLONAZEPAM 0.5 MG PO TABS: 0.5000 mg | ORAL_TABLET | Freq: Two times a day (BID) | ORAL | 0 refills | Status: DC | PRN

## 2016-03-13 MED ORDER — OXYCODONE-ACETAMINOPHEN 5-325 MG PO TABS: 1.0000 | ORAL_TABLET | Freq: Three times a day (TID) | ORAL | 0 refills | Status: DC | PRN

## 2016-03-13 NOTE — Telephone Encounter (Signed)
Called and talked to patient. Patient okay with spouse picking up the prescriptions for him. Printed his percocet and clonazepam and left at the front desk for his spouse to pick up.

## 2016-03-28 ENCOUNTER — Ambulatory Visit: Payer: Medicare Other | Admitting: Student

## 2016-04-18 ENCOUNTER — Encounter: Payer: Self-pay | Admitting: Student

## 2016-04-18 ENCOUNTER — Ambulatory Visit (INDEPENDENT_AMBULATORY_CARE_PROVIDER_SITE_OTHER): Payer: Medicare Other | Admitting: Student

## 2016-04-18 VITALS — BP 130/70 | HR 50 | Temp 98.1°F | Ht 64.0 in | Wt 173.0 lb

## 2016-04-18 DIAGNOSIS — R001 Bradycardia, unspecified: Secondary | ICD-10-CM

## 2016-04-18 DIAGNOSIS — Z0000 Encounter for general adult medical examination without abnormal findings: Secondary | ICD-10-CM

## 2016-04-18 DIAGNOSIS — G4700 Insomnia, unspecified: Secondary | ICD-10-CM | POA: Diagnosis not present

## 2016-04-18 DIAGNOSIS — G8929 Other chronic pain: Secondary | ICD-10-CM | POA: Diagnosis not present

## 2016-04-18 DIAGNOSIS — F411 Generalized anxiety disorder: Secondary | ICD-10-CM

## 2016-04-18 DIAGNOSIS — E1122 Type 2 diabetes mellitus with diabetic chronic kidney disease: Secondary | ICD-10-CM

## 2016-04-18 DIAGNOSIS — M5440 Lumbago with sciatica, unspecified side: Secondary | ICD-10-CM | POA: Diagnosis not present

## 2016-04-18 DIAGNOSIS — N184 Chronic kidney disease, stage 4 (severe): Secondary | ICD-10-CM | POA: Diagnosis not present

## 2016-04-18 DIAGNOSIS — M545 Low back pain: Secondary | ICD-10-CM

## 2016-04-18 LAB — POCT GLYCOSYLATED HEMOGLOBIN (HGB A1C): Hemoglobin A1C: 7.5

## 2016-04-18 MED ORDER — OXYCODONE-ACETAMINOPHEN 5-325 MG PO TABS: 1.0000 | ORAL_TABLET | Freq: Three times a day (TID) | ORAL | 0 refills | Status: DC | PRN

## 2016-04-18 MED ORDER — TRAZODONE HCL 50 MG PO TABS: 50.0000 mg | ORAL_TABLET | Freq: Every evening | ORAL | 0 refills | Status: DC | PRN

## 2016-04-18 NOTE — Progress Notes (Signed)
Subjective:    Joseph Mcdonald is a 66 y.o. old male here for chronic back pain  HPI Back pain: no change. Reports walking about 5 laps around the park almost every day. He now lives with his sister. Reports getting on treadmill for about 15-30 minutes a day. Percocet makes pain bearable when he exercises. He denies new changes to the nature of his pain. Denies fever, night sweat, appetite changes, unintentional weight loss, urinary and fecal incontinence, saddle anesthesia or recent fall. He denies smoking, drinking or drug use. He quite drinking more than 6 years ago.   Insomnia: Says Clonazepam helps to fall asleep.We have been titrating his benzo use down over the last several months and we have come down as low as 20 tablets of clonazepam 0.5 mg a month. I have started him on Lexapro as I wean down clonazepam. Initially compliance with Lexapro was an issue which has improved recently. He is at increase risk for fall given opiate and benzo use at the same time. He is open to try trazodone for sleep this time.    PMH/Problem List: has Hyperlipidemia; Diabetic neuropathy ([place]); Essential hypertension; Coronary artery disease involving native heart without angina pectoris; Alcohol withdrawal ([place]); Type 2 diabetes mellitus ([place]); Depression; Generalized anxiety disorder; Chronic back pain; Secondary hyperparathyroidism of renal origin ([place]); Anemia of chronic disease; Abnormal ECG; Syncope and collapse; Chronic kidney disease, stage 4 (severe) ([place]); History of ETOH abuse; NSTEMI (non-ST elevated myocardial infarction) ([place]); Routine adult health maintenance; Gout; Insomnia; and Bradycardia on his problem list.   has a past medical history of Anxiety; Depression; Diabetes mellitus without complication ([place]); Hypertension; and Peripheral neuropathy ([place]).  FH:  Family History  Problem Relation Age of Onset  . Heart attack Father   . Heart attack Brother   . Heart attack Brother     [facility] Social History    Substance Use Topics  . Smoking status: Never Smoker  . Smokeless tobacco: Never Used  . Alcohol use No    Immunizations needed: none  Review of Systems Review of systems negative except for pertinent positives and negatives in history of present illness above.     Objective:     Vitals:   04/18/16 1522  BP: 130/70  Pulse: (!) 50  Temp: 98.1 F (36.7 C)  TempSrc: Oral  SpO2: 99%  Weight: 78.5 kg (173 lb)  Height: 5\' 4"  (1.626 m)    Physical Exam GEN: appears well, ambulating without difficulty, normal gait, no apparent distress. Oropharynx: mmm without erythema or exudation HEM: negative for cervical or periauricular lymphadenopathies CVS: HR 56, RR, nl S1&S2, no murmurs, no edema, cap refills < 2 secs RESP: speaks in full sentence, no IWOB, CTAB GI: BS present & normal, soft, NTND MSK: no focal tenderness or notable swelling over his back, full range of motion.  SKIN: no apparent skin lesion NEURO: alert and oiented appropriately, no gross defecits  PSYCH: euthymic mood with congruent affect    Assessment and Plan:  Chronic back pain Back pain stable. No red flag on history of exam. Per patient, percocet keeps pain under control and enable him to be more active. No fall. Data base reviewed and no red flag noted. Gave three prescriptions for Percocet 5/325 mg #90 each for the next three months.   Insomnia Discussed about sleep hygiene. Patient agrees to try trazodone instead of Clonzepam. Concurrent use of the later with percocet increases his risk of fall. We have been weaning his benzo over the  last three to four months. Anxiety appears to be well controlled with improved compliance with Lexapro.   Routine adult health maintenance Patient reports eye exam at [facility] in [place]. Will obtain result.  He continues to resist colonoscopy.   Type 2 diabetes mellitus (HCC) Well controlled. My thought is,  improved mood and better pain control seems to be  helping. He is more active. He lost 13 lbs in the last 3 months. Congratulated on this.  No changes to his medication. Follow up in three months.   Bradycardia He is asymptomatic. He is not on AV nodal blocking medication. Will monitor.    Orders Placed This Encounter  Procedures  . HgB A1c    Return in about 3 months (around 07/17/2016) for DM and HTN.  Mercy Riding, MD 04/20/16 Pager: [phone]

## 2016-04-18 NOTE — Patient Instructions (Signed)
It was great seeing you today! We have addressed the following issues today  1. Back pain: We gave you three prescriptions for Percocet for the next 3 months. Keep up with the exercise.  2.   Insomnia: I have sent a prescription for trazodone to your pharmacy 3.   Diabetes: A1c 7.5 today. Continue medications. Please come back and see [place] three months.    If we did any lab work today, and the results require attention, either me or my nurse will get in touch with you. If everything is normal, you will get a letter in mail. If you don't hear from [place] in two weeks, please give [place] a call. Otherwise, we look forward to seeing you again at your next visit. If you have any questions or concerns before then, please call the clinic at [phone].   Please bring all your medications to every doctors visit   Sign up for My Chart to have easy access to your labs results, and communication with your Primary care physician.     Please check-out at the front desk before leaving the clinic.    Take Care,

## 2016-04-20 DIAGNOSIS — R001 Bradycardia, unspecified: Secondary | ICD-10-CM | POA: Insufficient documentation

## 2016-04-20 DIAGNOSIS — G4700 Insomnia, unspecified: Secondary | ICD-10-CM | POA: Insufficient documentation

## 2016-04-20 NOTE — Assessment & Plan Note (Signed)
Back pain stable. No red flag on history of exam. Per patient, percocet keeps pain under control and enable him to be more active. No fall. Data base reviewed and no red flag noted. Gave three prescriptions for Percocet 5/325 mg #90 each for the next three months.

## 2016-04-20 NOTE — Assessment & Plan Note (Signed)
Discussed about sleep hygiene. Patient agrees to try trazodone instead of Clonzepam. Concurrent use of the later with percocet increases his risk of fall. We have been weaning his benzo over the last three to four months. Anxiety appears to be well controlled with improved compliance with Lexapro.

## 2016-04-20 NOTE — Assessment & Plan Note (Signed)
He is asymptomatic. He is not on AV nodal blocking medication. Will monitor.

## 2016-04-20 NOTE — Assessment & Plan Note (Signed)
Patient reports eye exam at [facility] in [place]. Will obtain result.  He continues to resist colonoscopy.

## 2016-04-20 NOTE — Assessment & Plan Note (Signed)
Well controlled. My thought is,  improved mood and better pain control seems to be helping. He is more active. He lost 13 lbs in the last 3 months. Congratulated on this.  No changes to his medication. Follow up in three months.

## 2016-04-24 ENCOUNTER — Other Ambulatory Visit: Payer: Self-pay | Admitting: *Deleted

## 2016-04-24 DIAGNOSIS — F411 Generalized anxiety disorder: Secondary | ICD-10-CM

## 2016-04-25 MED ORDER — ESCITALOPRAM OXALATE 10 MG PO TABS: 10.0000 mg | ORAL_TABLET | Freq: Every day | ORAL | 1 refills | Status: DC

## 2016-04-29 ENCOUNTER — Telehealth: Payer: Self-pay | Admitting: Student

## 2016-04-29 NOTE — Telephone Encounter (Signed)
-----   Message from Mercy Riding, MD sent at 04/18/2016  8:34 PM EST ----- Regarding: Eye exam [facility] center in [place], [place] Address: [address], [place], [place] [address] [phone]  Thank you! Bretta Bang

## 2016-04-29 NOTE — Telephone Encounter (Signed)
I have requested a dm eye exam report  from [facility]. Their office will fax the report to our office.    [place]

## 2016-05-06 ENCOUNTER — Encounter: Payer: Self-pay | Admitting: Student

## 2016-05-17 ENCOUNTER — Other Ambulatory Visit: Payer: Self-pay | Admitting: Student

## 2016-05-17 ENCOUNTER — Telehealth: Payer: Self-pay | Admitting: Student

## 2016-05-17 DIAGNOSIS — G4700 Insomnia, unspecified: Secondary | ICD-10-CM

## 2016-05-20 ENCOUNTER — Other Ambulatory Visit: Payer: Self-pay | Admitting: Student

## 2016-05-20 ENCOUNTER — Telehealth: Payer: Self-pay | Admitting: *Deleted

## 2016-05-20 ENCOUNTER — Other Ambulatory Visit: Payer: Self-pay | Admitting: *Deleted

## 2016-05-20 DIAGNOSIS — I10 Essential (primary) hypertension: Secondary | ICD-10-CM

## 2016-05-20 DIAGNOSIS — N184 Chronic kidney disease, stage 4 (severe): Principal | ICD-10-CM

## 2016-05-20 DIAGNOSIS — E1122 Type 2 diabetes mellitus with diabetic chronic kidney disease: Secondary | ICD-10-CM

## 2016-05-20 MED ORDER — INSULIN DETEMIR 100 UNIT/ML ~~LOC~~ SOLN: 40.0000 [IU] | Freq: Every day | SUBCUTANEOUS | 11 refills | Status: DC

## 2016-05-20 MED ORDER — INSULIN ASPART 100 UNIT/ML ~~LOC~~ SOLN: 5.0000 [IU] | Freq: Three times a day (TID) | SUBCUTANEOUS | 11 refills | Status: DC

## 2016-05-20 MED ORDER — AMLODIPINE BESYLATE 10 MG PO TABS: 10.0000 mg | ORAL_TABLET | Freq: Every day | ORAL | 11 refills | Status: DC

## 2016-05-20 NOTE — Telephone Encounter (Signed)
Refilled the correct one. Thanks! Bretta Bang

## 2016-05-20 NOTE — Telephone Encounter (Signed)
Received a refill request for Novolog Flexpen: Inject 3-30 Units 4 times a day per sliding scale from [employer].  Medication s not listed on current med list.  Fax placed in provider box for review.  Please advise. Derl Barrow, RN

## 2016-05-28 ENCOUNTER — Telehealth: Payer: Self-pay | Admitting: *Deleted

## 2016-05-28 NOTE — Telephone Encounter (Signed)
Received refill request for Ulticare 31G inj #100.  Not listed on current med list.  Fax placed in provider box for review.  Derl Barrow, RN

## 2016-05-31 ENCOUNTER — Other Ambulatory Visit: Payer: Self-pay | Admitting: Student

## 2016-05-31 DIAGNOSIS — N184 Chronic kidney disease, stage 4 (severe): Principal | ICD-10-CM

## 2016-05-31 DIAGNOSIS — E1122 Type 2 diabetes mellitus with diabetic chronic kidney disease: Secondary | ICD-10-CM

## 2016-05-31 MED ORDER — INSULIN PEN NEEDLE 31G X 8 MM MISC: 11 refills | Status: DC

## 2016-05-31 NOTE — Progress Notes (Signed)
Pen needle order signed by Dr. Andria Frames and placed in out going fax box by Dr. Cyndia Skeeters #100 Direction: use one 4 times a day Refills:11

## 2016-06-08 ENCOUNTER — Other Ambulatory Visit: Payer: Self-pay | Admitting: Student

## 2016-06-08 DIAGNOSIS — G4700 Insomnia, unspecified: Secondary | ICD-10-CM

## 2016-06-17 ENCOUNTER — Other Ambulatory Visit: Payer: Self-pay | Admitting: Student

## 2016-06-17 DIAGNOSIS — G4700 Insomnia, unspecified: Secondary | ICD-10-CM

## 2016-06-20 ENCOUNTER — Other Ambulatory Visit: Payer: Self-pay | Admitting: *Deleted

## 2016-06-20 DIAGNOSIS — F411 Generalized anxiety disorder: Secondary | ICD-10-CM

## 2016-06-20 MED ORDER — ESCITALOPRAM OXALATE 10 MG PO TABS: 10.0000 mg | ORAL_TABLET | Freq: Every day | ORAL | 3 refills | Status: DC

## 2016-07-09 ENCOUNTER — Other Ambulatory Visit: Payer: Self-pay | Admitting: *Deleted

## 2016-07-09 MED ORDER — INSULIN DETEMIR 100 UNIT/ML ~~LOC~~ SOLN: 40.0000 [IU] | Freq: Every day | SUBCUTANEOUS | 11 refills | Status: DC

## 2016-07-18 ENCOUNTER — Ambulatory Visit (INDEPENDENT_AMBULATORY_CARE_PROVIDER_SITE_OTHER): Payer: Medicare Other | Admitting: Student

## 2016-07-18 VITALS — BP 124/60 | HR 60 | Temp 98.0°F | Wt 180.0 lb

## 2016-07-18 DIAGNOSIS — G8929 Other chronic pain: Secondary | ICD-10-CM | POA: Diagnosis not present

## 2016-07-18 DIAGNOSIS — M5440 Lumbago with sciatica, unspecified side: Secondary | ICD-10-CM | POA: Diagnosis not present

## 2016-07-18 DIAGNOSIS — I10 Essential (primary) hypertension: Secondary | ICD-10-CM

## 2016-07-18 DIAGNOSIS — E1122 Type 2 diabetes mellitus with diabetic chronic kidney disease: Secondary | ICD-10-CM

## 2016-07-18 DIAGNOSIS — Z0000 Encounter for general adult medical examination without abnormal findings: Secondary | ICD-10-CM | POA: Diagnosis not present

## 2016-07-18 DIAGNOSIS — N184 Chronic kidney disease, stage 4 (severe): Secondary | ICD-10-CM | POA: Diagnosis not present

## 2016-07-18 LAB — POCT GLYCOSYLATED HEMOGLOBIN (HGB A1C): HEMOGLOBIN A1C: 8.4

## 2016-07-18 MED ORDER — OXYCODONE-ACETAMINOPHEN 5-325 MG PO TABS: 1.0000 | ORAL_TABLET | Freq: Three times a day (TID) | ORAL | 0 refills | Status: DC | PRN

## 2016-07-18 MED ORDER — INSULIN GLARGINE 100 UNIT/ML SOLOSTAR PEN: 40.0000 [IU] | PEN_INJECTOR | Freq: Every day | SUBCUTANEOUS | 11 refills | Status: DC

## 2016-07-18 MED ORDER — INSULIN ASPART 100 UNIT/ML FLEXPEN: 7.0000 [IU] | PEN_INJECTOR | Freq: Three times a day (TID) | SUBCUTANEOUS | 11 refills | Status: DC

## 2016-07-18 NOTE — Patient Instructions (Signed)
It was great seeing you today! We have addressed the following issues today 1. Diabetes: Your A1c is 8.4 today. It it was 7.5 for about 3 months ago. I recommend cutting down on the amount of milk you drink, not more than 2 cups a day and exercising more (see below for more).  I changed the Levemir to Lantus. You inject 40 units at bed time  NovoLog 7 units 15 minutes before meals 3 times a day.  Check your blood sugar 4 times a day (before breakfast, lunch and dinner and before bedtime) two to three days a week. Write down the numbers on the sheet we gave you and bring to your next visit.   Please come back and see [place] in 2-3 weeks.   Please bring all your medication bottles to that visit.   2. Back pain: I gave her a prescription for Percocet for one month. Please come back and see [place] before you next refill  3. Colon cancer screening: I strongly recommend you schedule colonoscopy as soon as possible  If we did any lab work today, and the results require attention, either me or my nurse will get in touch with you. If everything is normal, you will get a letter in mail and a message via . If you don't hear from [place] in two weeks, please give [place] a call. Otherwise, we look forward to seeing you again at your next visit. If you have any questions or concerns before then, please call the clinic at [phone].  Please bring all your medications to every doctors visit  Sign up for My Chart to have easy access to your labs results, and communication with your Primary care physician.    Please check-out at the front desk before leaving the clinic.    Take Care,   Dr. Cyndia Skeeters  Portion Size    Choose healthier foods such as 100% whole grains, vegetables, fruits, beans, nut seeds, olive oil, most vegetable oils, fat-free dietary, wild game and fish.   Avoid sweet tea, other sweetened beverages, soda, fruit juice, cold cereal and milk and trans fat.   Eat at least 3 meals and 1-2 snacks  per day.  Aim for no more than 5 hours between eating.  Eat breakfast within one hour of getting up.    Exercise at least 150 minutes per week, including weight resistance exercises 3 or 4 times per week.   Try to lose at least 7-10% of your current body weight.             Colon Cancer  People with early colon cancer usually have no warning signs or symptoms.  If found early, most patients can be cured, but if found when it has already spread, the chance of survival is not as good.  Colon cancer is the second most common cause of concern is in the [place] with over 56,000 deaths from colon cancer in 2005  Colon cancer is a common, treatable disease. Screening tests can find a cancer  early, before you have symptoms, and make it more likely that you will survive the disease.  Who needs to be tested? If you are age 42-75 yrs, you should be tested for colon cancer.  Ways to be tested:  A colonoscopy the best test to detect colon cancer. It requires you to drink a bowel preparation to clean out your colon before the test. During this test, a tube with a camera inserted into your rectum and examines  your entire colon. You can be given medicine to make you sleepy during the exam. Therefore, you will not be able to drive immediately after the test. There is a small risk of bowel injury during the test.   Stool cards that you can take home and take a sample of your stool is another option. The cards are not as good as colonoscopy at detecting cancer, but the tests are easier and cheaper.   To schedule the colonoscopy, you can call one of the 3 options below:  [facility]. Phone number: [phone]  [facility]. Phone number: [phone]  [facility]: Phone number [phone]

## 2016-07-18 NOTE — Assessment & Plan Note (Addendum)
Worse. A1c 8.4% today. It was 7.5% about 3 months ago. Dietary indiscretion seems to be an issue. He reports drinking up to 6 cups of 2% milk a day. He gained about 7 pounds since his last visit 3 months ago.   I recommended cutting down on the amount of milky drinks a day, not more than 2 cups a day.  Discussed about diet and exercise including portion size and gave handout  I changed the Levemir to Lantus, 40 units at bed time  NovoLog 7 units 15 minutes before meals 3 times a day.  Check your blood sugar 4 times a day (before breakfast, lunch and dinner and before bedtime) two to three days a week. Write down the numbers on the sheet we gave you and bring to your next visit.   Follow-up in 2-3 weeks  On ACE inhibitors for renal protection, up-to-date on eye and foot exam. Hard his pneumonia vaccination.   BMP today

## 2016-07-18 NOTE — Progress Notes (Signed)
Subjective:    Joseph Mcdonald is a 66 y.o. old male here for follow-up on diabetes  HPI Diabetes: A1c 8.4% today. It was 7.5 about 3 months ago. He reports good compliance with his medication. He is on Levemir 40 units at bedtime and NovoLog 5 units 3 times a day with meals. Reports checking his blood sugars 4 times a day before he eats. He says his blood sugar usually runs in the low 200s but he did not bring his blood glucose log. He reports drinking up to 6 cups of 2% milk a day lately. Often have potatoes for lunch and dinner. Denies soft drink or juices. Reports walking on treadmill every day for 20 minutes.  Denies vision changes, numbness or tingling in his arms or legs, polyuria or other symptoms of hyper or hypoglycemia.   PMH/Problem List: has Hyperlipidemia; Diabetic neuropathy ([place]); Essential hypertension; Coronary artery disease involving native heart without angina pectoris; Alcohol withdrawal ([place]); Type 2 diabetes mellitus ([place]); Depression; Generalized anxiety disorder; Chronic back pain; Secondary hyperparathyroidism of renal origin ([place]); Anemia of chronic disease; Abnormal ECG; Syncope and collapse; Chronic kidney disease, stage 4 (severe) ([place]); History of ETOH abuse; Routine adult health maintenance; Gout; Insomnia; and Bradycardia on his problem list.   has a past medical history of Anxiety; Depression; Diabetes mellitus without complication ([place]); Hypertension; and Peripheral neuropathy ([place]).  FH:  Family History  Problem Relation Age of Onset  . Heart attack Father   . Heart attack Brother   . Heart attack Brother     [facility] Social History  Substance Use Topics  . Smoking status: Never Smoker  . Smokeless tobacco: Never Used  . Alcohol use No    Review of Systems Review of systems negative except for pertinent positives and negatives in history of present illness above.     Objective:     Vitals:   07/18/16 1341  BP: 124/60  Pulse: 60  Temp: 98 F (36.7 C)    TempSrc: Oral  SpO2: 98%  Weight: 81.6 kg (180 lb)    Physical Exam GEN: appears well, no apparent distress CVS: HR 56, RR, nl S1&S2, no murmurs, trace edema RESP: speaks in full sentence, no IWOB, CTAB MSK: no focal tenderness or notable swelling SKIN: no apparent skin lesion NEURO: alert and oiented appropriately, no gross defecits  PSYCH: euthymic mood with congruent affect    Assessment and Plan:  Type 2 diabetes mellitus ([place]) Worse. A1c 8.4% today. It was 7.5% about 3 months ago. Dietary indiscretion seems to be an issue. He reports drinking up to 6 cups of 2% milk a day. He gained about 7 pounds since his last visit 3 months ago.   I recommended cutting down on the amount of milky drinks a day, not more than 2 cups a day.  Discussed about diet and exercise including portion size and gave handout  I changed the Levemir to Lantus, 40 units at bed time  NovoLog 7 units 15 minutes before meals 3 times a day.  Check your blood sugar 4 times a day (before breakfast, lunch and dinner and before bedtime) two to three days a week. Write down the numbers on the sheet we gave you and bring to your next visit.   Follow-up in 2-3 weeks  On ACE inhibitors for renal protection, up-to-date on eye and foot exam. Hard his pneumonia vaccination.   BMP today   Chronic back pain Toward the end of the encounter, patient reported that he is  about to run out of his chronic pain medication.   Give him Percocet 5/325 mg, #90 for 1 month  Patient to return for evaluation of his back before his next refill  Routine adult health maintenance Encouraged him to have his colonoscopy for colon cancer screening and gave him the phone numbers to schedule this  Orders Placed This Encounter  Procedures  . Basic metabolic panel  . POCT HgB A1C (CPT I3431156)    Return in about 2 weeks (around 08/01/2016) for DM check.  Mercy Riding, MD 07/18/16 Pager: [phone]

## 2016-07-18 NOTE — Assessment & Plan Note (Signed)
Encouraged him to have his colonoscopy for colon cancer screening and gave him the phone numbers to schedule this

## 2016-07-18 NOTE — Assessment & Plan Note (Signed)
Toward the end of the encounter, patient reported that he is about to run out of his chronic pain medication.   Give him Percocet 5/325 mg, #90 for 1 month  Patient to return for evaluation of his back before his next refill

## 2016-07-19 ENCOUNTER — Encounter: Payer: Self-pay | Admitting: Student

## 2016-07-19 ENCOUNTER — Other Ambulatory Visit: Payer: Self-pay | Admitting: Student

## 2016-07-19 ENCOUNTER — Other Ambulatory Visit: Payer: Self-pay | Admitting: *Deleted

## 2016-07-19 DIAGNOSIS — E875 Hyperkalemia: Secondary | ICD-10-CM

## 2016-07-19 DIAGNOSIS — G4700 Insomnia, unspecified: Secondary | ICD-10-CM

## 2016-07-19 DIAGNOSIS — I10 Essential (primary) hypertension: Secondary | ICD-10-CM

## 2016-07-19 LAB — BASIC METABOLIC PANEL
BUN/Creatinine Ratio: 17 (ref 10–24)
BUN: 51 mg/dL — ABNORMAL HIGH (ref 8–27)
CALCIUM: 8.7 mg/dL (ref 8.6–10.2)
CHLORIDE: 96 mmol/L (ref 96–106)
CO2: 18 mmol/L (ref 18–29)
Creatinine, Ser: 3.05 mg/dL — ABNORMAL HIGH (ref 0.76–1.27)
GFR calc non Af Amer: 20 mL/min/{1.73_m2} — ABNORMAL LOW (ref >59)
GFR, EST AFRICAN AMERICAN: 23 mL/min/{1.73_m2} — AB (ref >59)
Glucose: 319 mg/dL — ABNORMAL HIGH (ref 65–99)
POTASSIUM: 5.5 mmol/L — AB (ref 3.5–5.2)
SODIUM: 132 mmol/L — AB (ref 134–144)

## 2016-07-19 MED ORDER — TRAZODONE HCL 50 MG PO TABS: 50.0000 mg | ORAL_TABLET | Freq: Every evening | ORAL | 0 refills | Status: DC | PRN

## 2016-07-19 NOTE — Progress Notes (Signed)
BMP with hyperglycemia (mild DKA) and Hyper-K to 5.5. Called and talked to his daughter, Lattie Haw. Recommended stopping lisinopril and taking his insulin diligently. Recommended lab visits in one to 2 week for another BMP.

## 2016-07-19 NOTE — Progress Notes (Signed)
Future order for BMP. Patient with hyperkalemia and mild DKA on recent BMP

## 2016-08-22 ENCOUNTER — Ambulatory Visit: Payer: Medicare Other | Admitting: Student

## 2016-08-23 ENCOUNTER — Ambulatory Visit (INDEPENDENT_AMBULATORY_CARE_PROVIDER_SITE_OTHER): Payer: Medicare Other | Admitting: Student

## 2016-08-23 ENCOUNTER — Encounter: Payer: Self-pay | Admitting: Student

## 2016-08-23 VITALS — BP 125/80 | HR 50 | Temp 98.5°F | Ht 64.0 in | Wt 181.8 lb

## 2016-08-23 DIAGNOSIS — R001 Bradycardia, unspecified: Secondary | ICD-10-CM

## 2016-08-23 DIAGNOSIS — N184 Chronic kidney disease, stage 4 (severe): Secondary | ICD-10-CM

## 2016-08-23 DIAGNOSIS — G4700 Insomnia, unspecified: Secondary | ICD-10-CM

## 2016-08-23 DIAGNOSIS — I10 Essential (primary) hypertension: Secondary | ICD-10-CM | POA: Diagnosis not present

## 2016-08-23 DIAGNOSIS — E1122 Type 2 diabetes mellitus with diabetic chronic kidney disease: Secondary | ICD-10-CM | POA: Diagnosis present

## 2016-08-23 DIAGNOSIS — Z79891 Long term (current) use of opiate analgesic: Secondary | ICD-10-CM | POA: Diagnosis not present

## 2016-08-23 DIAGNOSIS — M5440 Lumbago with sciatica, unspecified side: Secondary | ICD-10-CM

## 2016-08-23 DIAGNOSIS — G8929 Other chronic pain: Secondary | ICD-10-CM | POA: Diagnosis not present

## 2016-08-23 DIAGNOSIS — E1142 Type 2 diabetes mellitus with diabetic polyneuropathy: Secondary | ICD-10-CM

## 2016-08-23 DIAGNOSIS — E7800 Pure hypercholesterolemia, unspecified: Secondary | ICD-10-CM | POA: Diagnosis not present

## 2016-08-23 DIAGNOSIS — M545 Low back pain: Secondary | ICD-10-CM

## 2016-08-23 MED ORDER — OXYCODONE-ACETAMINOPHEN 5-325 MG PO TABS: 1.0000 | ORAL_TABLET | Freq: Three times a day (TID) | ORAL | 0 refills | Status: DC | PRN

## 2016-08-23 MED ORDER — NALOXONE HCL 2 MG/2ML IJ SOSY: 0.4000 mg | PREFILLED_SYRINGE | INTRAMUSCULAR | 0 refills | Status: DC | PRN

## 2016-08-23 MED ORDER — LIRAGLUTIDE 18 MG/3ML ~~LOC~~ SOPN: PEN_INJECTOR | SUBCUTANEOUS | 0 refills | Status: DC

## 2016-08-23 MED ORDER — GABAPENTIN 300 MG PO CAPS: 300.0000 mg | ORAL_CAPSULE | Freq: Two times a day (BID) | ORAL | 3 refills | Status: DC

## 2016-08-23 MED ORDER — TRAZODONE HCL 50 MG PO TABS: 50.0000 mg | ORAL_TABLET | Freq: Every evening | ORAL | 3 refills | Status: DC | PRN

## 2016-08-23 MED ORDER — ATORVASTATIN CALCIUM 40 MG PO TABS: 40.0000 mg | ORAL_TABLET | Freq: Every day | ORAL | 3 refills | Status: DC

## 2016-08-23 MED ORDER — AMLODIPINE BESYLATE 10 MG PO TABS: 10.0000 mg | ORAL_TABLET | Freq: Every day | ORAL | 3 refills | Status: DC

## 2016-08-23 NOTE — Assessment & Plan Note (Signed)
Normotensive. He is on amlodipine 10 mg daily. We will check his BMP today.

## 2016-08-23 NOTE — Assessment & Plan Note (Signed)
Refilled his gabapentin today.

## 2016-08-23 NOTE — Progress Notes (Signed)
Subjective:    Joseph Mcdonald is a 66 y.o. old male here for back pain and diabetes. Patient is here with his daughter.   HPI Back pain: this is chronic issue. He described his pain as steady and sharp. Pain character unchanged but worse. Pain is over his lower back. No radiation. Denies, weakness, numbness, tingling, fever, chills, fatigue, night sweats, bowel or bladder issue, saddle anesthesia/paresthesia or fall. Gained about 2 pounds since he was here about a month ago.  Has neuropathy at baseline. Pain improves with Percocet. He says his last dose was yesterday about 1 PM. He is still trying to walk and be active. He asks if we can increase his Percocet dose.   Diabetes: he brought his blood glucose log to this visit. His fasting and pre-meal blood glucose ranges from 117-250. He is usually in upper 100's. He reports good compliance with his Lantus insulin and NovoLog. He reports cutting down on the amount of milky drinks. He says he is trying to follow the portion size with discussed at the last visit. Unfortunately he gained 2 pounds since I saw him last. Denies symptoms of hypoglycemia or hyperglycemia.   PMH/Problem List: has Hyperlipidemia; Diabetic neuropathy ([place]); Essential hypertension; Coronary artery disease involving native heart without angina pectoris; Alcohol withdrawal ([place]); Type 2 diabetes mellitus ([place]); Depression; Generalized anxiety disorder; Chronic back pain; Secondary hyperparathyroidism of renal origin ([place]); Anemia of chronic disease; Abnormal ECG; Syncope and collapse; Chronic kidney disease, stage 4 (severe) ([place]); History of ETOH abuse; Routine adult health maintenance; Gout; Insomnia; and Bradycardia on his problem list.   has a past medical history of Anxiety; Depression; Diabetes mellitus without complication ([place]); Hypertension; and Peripheral neuropathy.  FH:  Family History  Problem Relation Age of Onset  . Heart attack Father   . Heart attack Brother   . Heart  attack Brother     [facility] Social History  Substance Use Topics  . Smoking status: Never Smoker  . Smokeless tobacco: Never Used  . Alcohol use No    Review of Systems Review of systems negative except for pertinent positives and negatives in history of present illness above.     Objective:     Vitals:   08/23/16 1340  BP: 125/80  Pulse: (!) 50  Temp: 98.5 F (36.9 C)  TempSrc: Oral  SpO2: 97%  Weight: 181 lb 12.8 oz (82.5 kg)  Height: 5\' 4"  (1.626 m)    Physical Exam GEN: appears well, no apparent distress. CVS: RRR, nl S1&S2, no murmurs, trace edema RESP: no IWOB, good air movement bilaterally, CTAB GI: Obese,  BS present & normal, soft, NTND MSK: Back:  No step offs, some tenderness to palpation over lumbar paraspinous muscles bilaterally.  Straight leg and counter straight leg raise negative  Neuro exam in LE: motor 5/5 in in the knee flexors and extenders, ankle dorsiflexors and plantar flexors, light sensation intact in L4-S1 dermatomes bilaterally. He has trace edema bilaterally.   SKIN: no apparent skin lesion NEURO: alert and oiented appropriately, no gross defecits  PSYCH: euthymic mood with congruent affect    Assessment and Plan:  Chronic back pain Chronic. No significant change. No red flags. Neuro exam benign. He says he was unable to walk as he used to due to pain.  -Gave 3 prescriptions for Percocet 5/325 mg, #90 each for the next 3 months -Sent prescription for Narcan to his pharmacy -UDS obtained today -No red flag on database  Type 2 diabetes mellitus ([place]) Last  A1c 8.4%. Fasting and pre-meal blood glucose in the range of 117-250. Reports good compliance with his insulin. -Added Victoza today. He will start with 0.6 mg daily and increase it to 1.2 mg after one week. Recommended going up to 1.8 mg if his blood glucose continues to be greater than 140 after two weeks. -Patient to call and let me know if consequent blood glucose less 100    -Discussed about hypoglycemia symptoms and management -Patient to continue checking CBG before meals and write them.  -Gave him glucose log sheet.  -He is off his lisinopril due to hyperkalemia. Checking BMP today.   Essential hypertension Normotensive. He is on amlodipine 10 mg daily. We will check his BMP today.  Diabetic neuropathy ([facility]) Refilled his gabapentin today.  Bradycardia Continues to have heart rate in 50s. He has first degree AVB. Not on any nodal blocking medication. He is asymptomatic from this.  Orders Placed This Encounter  Procedures  . Basic metabolic panel  . ToxASSURE Select 13 (MW), Urine    Return in about 2 months (around 10/23/2016) for Diabetes.  Mercy Riding, MD 08/23/16 Pager: [phone]

## 2016-08-23 NOTE — Assessment & Plan Note (Addendum)
Chronic. No significant change. No red flags. Neuro exam benign. He says he was unable to walk as he used to due to pain.  -Gave 3 prescriptions for Percocet 5/325 mg, #90 each for the next 3 months -Sent prescription for Narcan to his pharmacy -UDS obtained today -No red flag on database

## 2016-08-23 NOTE — Assessment & Plan Note (Addendum)
Last A1c 8.4%. Fasting and pre-meal blood glucose in the range of 117-250. Reports good compliance with his insulin. -Added Victoza today. He will start with 0.6 mg daily and increase it to 1.2 mg after one week. Recommended going up to 1.8 mg if his blood glucose continues to be greater than 140 after two weeks. -Patient to call and let me know if consequent blood glucose less 100  -Discussed about hypoglycemia symptoms and management -Patient to continue checking CBG before meals and write them.  -Gave him glucose log sheet.  -He is off his lisinopril due to hyperkalemia. Checking BMP today.

## 2016-08-23 NOTE — Assessment & Plan Note (Addendum)
Continues to have heart rate in 50s. He has first degree AVB. Not on any nodal blocking medication. He is asymptomatic from this.

## 2016-08-23 NOTE — Patient Instructions (Signed)
It was great seeing you today! We have addressed the following issues today 1. Diabetes: I have added a new medication called Victoza. Follow the instruction on this. Continue using the insulin.  2.   Back pain: I have refilled your prescription.   If we did any lab work today, and the results require attention, either me or my nurse will get in touch with you. If everything is normal, you will get a letter in mail and a message via . If you don't hear from [place] in two weeks, please give [place] a call. Otherwise, we look forward to seeing you again at your next visit. If you have any questions or concerns before then, please call the clinic at [phone].  Please bring all your medications to every doctors visit  Sign up for My Chart to have easy access to your labs results, and communication with your Primary care physician.    Please check-out at the front desk before leaving the clinic.    Hypoglycemia Hypoglycemia is when the sugar (glucose) level in the blood is too low. Symptoms of low blood sugar may include:  Feeling: ? Hungry. ? Worried or nervous (anxious). ? Sweaty and clammy. ? Confused. ? Dizzy. ? Sleepy. ? Sick to your stomach (nauseous).  Having: ? A fast heartbeat. ? A headache. ? A change in your vision. ? Jerky movements that you cannot control (seizure). ? Nightmares. ? Tingling or no feeling (numbness) around the mouth, lips, or tongue.  Having trouble with: ? Talking. ? Paying attention (concentrating). ? Moving (coordination). ? Sleeping.  Shaking.  Passing out (fainting).  Getting upset easily (irritability).  Low blood sugar can happen to people who have diabetes and people who do not have diabetes. Low blood sugar can happen quickly, and it can be an emergency. Treating Low Blood Sugar Low blood sugar is often treated by eating or drinking something sugary right away. If you can think clearly and swallow safely, follow the 15:15 rule:  Take 15  grams of a fast-acting carb (carbohydrate). Some fast-acting carbs are: ? 1 tube of glucose gel. ? 3 sugar tablets (glucose pills). ? 6-8 pieces of hard candy. ? 4 oz (120 mL) of fruit juice. ? 4 oz (120 mL) of regular (not diet) soda.  Check your blood sugar 15 minutes after you take the carb.  If your blood sugar is still at or below 70 mg/dL (3.9 mmol/L), take 15 grams of a carb again.  If your blood sugar does not go above 70 mg/dL (3.9 mmol/L) after 3 tries, get help right away.  After your blood sugar goes back to normal, eat a meal or a snack within 1 hour.  Treating Very Low Blood Sugar If your blood sugar is at or below 54 mg/dL (3 mmol/L), you have very low blood sugar (severe hypoglycemia). This is an emergency. Do not wait to see if the symptoms will go away. Get medical help right away. Call your local emergency services (911 in the [place] Do not drive yourself to the hospital. If you have very low blood sugar and you cannot eat or drink, you may need a glucagon shot (injection). A family member or friend should learn how to check your blood sugar and how to give you a glucagon shot. Ask your doctor if you need to have a glucagon shot kit at home. Follow these instructions at home: General instructions  Avoid any diets that cause you to not eat enough food. Talk  with your doctor before you start any new diet.  Take over-the-counter and prescription medicines only as told by your doctor.  Limit alcohol to no more than 1 drink per day for nonpregnant women and 2 drinks per day for men. One drink equals 12 oz of beer, 5 oz of wine, or 1 oz of hard liquor.  Keep all follow-up visits as told by your doctor. This is important. If You Have Diabetes:   Make sure you know the symptoms of low blood sugar.  Always keep a source of sugar with you, such as: ? Sugar. ? Sugar tablets. ? Glucose gel. ? Fruit juice. ? Regular soda (not diet soda). ? Milk. ? Hard  candy. ? Honey.  Take your medicines as told.  Follow your exercise and meal plan. ? Eat on time. Do not skip meals. ? Follow your sick day plan when you cannot eat or drink normally. Make this plan ahead of time with your doctor.  Check your blood sugar as often as told by your doctor. Always check before and after exercise.  Share your diabetes care plan with: ? Your work or school. ? People you live with.  Check your pee (urine) for ketones: ? When you are sick. ? As told by your doctor.  Carry a card or wear jewelry that says you have diabetes. If You Have Low Blood Sugar From Other Causes:   Check your blood sugar as often as told by your doctor.  Follow instructions from your doctor about what you cannot eat or drink. Contact a doctor if:  You have trouble keeping your blood sugar in your target range.  You have low blood sugar often. Get help right away if:  You still have symptoms after you eat or drink something sugary.  Your blood sugar is at or below 54 mg/dL (3 mmol/L).  You have jerky movements that you cannot control.  You pass out. These symptoms may be an emergency. Do not wait to see if the symptoms will go away. Get medical help right away. Call your local emergency services (911 in the [place] Do not drive yourself to the hospital. This information is not intended to replace advice given to you by your health care provider. Make sure you discuss any questions you have with your health care provider. Document Released: 06/05/2009 Document Revised: 08/17/2015 Document Reviewed: 04/14/2015 Elsevier Interactive Patient Education  2018 [place],   Dr. Cyndia Skeeters

## 2016-08-24 LAB — BASIC METABOLIC PANEL
BUN / CREAT RATIO: 16 (ref 10–24)
BUN: 54 mg/dL — ABNORMAL HIGH (ref 8–27)
CO2: 17 mmol/L — AB (ref 18–29)
CREATININE: 3.29 mg/dL — AB (ref 0.76–1.27)
Calcium: 8.6 mg/dL (ref 8.6–10.2)
Chloride: 103 mmol/L (ref 96–106)
GFR calc Af Amer: 21 mL/min/{1.73_m2} — ABNORMAL LOW (ref >59)
GFR, EST NON AFRICAN AMERICAN: 18 mL/min/{1.73_m2} — AB (ref >59)
GLUCOSE: 135 mg/dL — AB (ref 65–99)
Potassium: 4.9 mmol/L (ref 3.5–5.2)
SODIUM: 136 mmol/L (ref 134–144)

## 2016-08-26 ENCOUNTER — Other Ambulatory Visit: Payer: Self-pay | Admitting: *Deleted

## 2016-08-26 DIAGNOSIS — M109 Gout, unspecified: Secondary | ICD-10-CM

## 2016-08-26 MED ORDER — ALLOPURINOL 100 MG PO TABS: 100.0000 mg | ORAL_TABLET | Freq: Every day | ORAL | 2 refills | Status: DC

## 2016-08-28 ENCOUNTER — Other Ambulatory Visit: Payer: Self-pay | Admitting: *Deleted

## 2016-08-28 LAB — TOXASSURE SELECT 13 (MW), URINE

## 2016-08-28 MED ORDER — POLYETHYLENE GLYCOL 3350 17 G PO PACK: 17.0000 g | PACK | Freq: Two times a day (BID) | ORAL | 0 refills | Status: DC

## 2016-08-30 ENCOUNTER — Encounter: Payer: Self-pay | Admitting: Student

## 2016-08-30 DIAGNOSIS — C4491 Basal cell carcinoma of skin, unspecified: Secondary | ICD-10-CM | POA: Insufficient documentation

## 2016-08-31 ENCOUNTER — Encounter (HOSPITAL_COMMUNITY): Payer: Self-pay

## 2016-08-31 ENCOUNTER — Emergency Department (HOSPITAL_COMMUNITY): Payer: Medicare Other

## 2016-08-31 ENCOUNTER — Emergency Department (HOSPITAL_COMMUNITY)
Admission: EM | Admit: 2016-08-31 | Discharge: 2016-09-01 | Disposition: A | Discharge status: Home / Self Care | Payer: Medicare Other | Attending: Emergency Medicine | Admitting: Emergency Medicine

## 2016-08-31 DIAGNOSIS — Z794 Long term (current) use of insulin: Secondary | ICD-10-CM | POA: Diagnosis not present

## 2016-08-31 DIAGNOSIS — Z79899 Other long term (current) drug therapy: Secondary | ICD-10-CM | POA: Insufficient documentation

## 2016-08-31 DIAGNOSIS — Z79891 Long term (current) use of opiate analgesic: Secondary | ICD-10-CM | POA: Diagnosis not present

## 2016-08-31 DIAGNOSIS — S01312A Laceration without foreign body of left ear, initial encounter: Secondary | ICD-10-CM

## 2016-08-31 DIAGNOSIS — W01198A Fall on same level from slipping, tripping and stumbling with subsequent striking against other object, initial encounter: Secondary | ICD-10-CM | POA: Diagnosis not present

## 2016-08-31 DIAGNOSIS — F1011 Alcohol abuse, in remission: Secondary | ICD-10-CM | POA: Insufficient documentation

## 2016-08-31 DIAGNOSIS — I129 Hypertensive chronic kidney disease with stage 1 through stage 4 chronic kidney disease, or unspecified chronic kidney disease: Secondary | ICD-10-CM | POA: Insufficient documentation

## 2016-08-31 DIAGNOSIS — C4491 Basal cell carcinoma of skin, unspecified: Secondary | ICD-10-CM | POA: Insufficient documentation

## 2016-08-31 DIAGNOSIS — F10120 Alcohol abuse with intoxication, uncomplicated: Secondary | ICD-10-CM | POA: Insufficient documentation

## 2016-08-31 DIAGNOSIS — Y939 Activity, unspecified: Secondary | ICD-10-CM | POA: Insufficient documentation

## 2016-08-31 DIAGNOSIS — N184 Chronic kidney disease, stage 4 (severe): Secondary | ICD-10-CM | POA: Insufficient documentation

## 2016-08-31 DIAGNOSIS — Z880 Allergy status to penicillin: Secondary | ICD-10-CM | POA: Diagnosis not present

## 2016-08-31 DIAGNOSIS — F10920 Alcohol use, unspecified with intoxication, uncomplicated: Secondary | ICD-10-CM

## 2016-08-31 DIAGNOSIS — N2581 Secondary hyperparathyroidism of renal origin: Secondary | ICD-10-CM | POA: Insufficient documentation

## 2016-08-31 DIAGNOSIS — E0822 Diabetes mellitus due to underlying condition with diabetic chronic kidney disease: Secondary | ICD-10-CM | POA: Insufficient documentation

## 2016-08-31 DIAGNOSIS — Y929 Unspecified place or not applicable: Secondary | ICD-10-CM | POA: Diagnosis not present

## 2016-08-31 DIAGNOSIS — Z951 Presence of aortocoronary bypass graft: Secondary | ICD-10-CM | POA: Diagnosis not present

## 2016-08-31 DIAGNOSIS — Y999 Unspecified external cause status: Secondary | ICD-10-CM | POA: Diagnosis not present

## 2016-08-31 NOTE — ED Triage Notes (Signed)
To room via EMS.  Pt at [employer], per wife drank most of fifth of liquor.  Wife thought pt had been sober for the past 2 years. Pt fell hit side of left head on wooden toy box.  Lac to ear noted.  Skin tears to elbows.  Pt is alert to person, place, not time, situation.
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
## 2016-08-31 NOTE — ED Notes (Signed)
Taken to xray at this time. 

## 2016-09-01 DIAGNOSIS — S01312A Laceration without foreign body of left ear, initial encounter: Secondary | ICD-10-CM | POA: Diagnosis not present

## 2016-09-01 LAB — COMPREHENSIVE METABOLIC PANEL
ALK PHOS: 61 U/L (ref 38–126)
ALT: 20 U/L (ref 17–63)
ANION GAP: 10 (ref 5–15)
AST: 23 U/L (ref 15–41)
Albumin: 3.8 g/dL (ref 3.5–5.0)
BILIRUBIN TOTAL: 0.3 mg/dL (ref 0.3–1.2)
BUN: 37 mg/dL — ABNORMAL HIGH (ref 6–20)
CALCIUM: 8.1 mg/dL — AB (ref 8.9–10.3)
CO2: 19 mmol/L — ABNORMAL LOW (ref 22–32)
Chloride: 106 mmol/L (ref 101–111)
Creatinine, Ser: 3.21 mg/dL — ABNORMAL HIGH (ref 0.61–1.24)
GFR calc non Af Amer: 19 mL/min — ABNORMAL LOW (ref >60)
GFR, EST AFRICAN AMERICAN: 21 mL/min — AB (ref >60)
Glucose, Bld: 154 mg/dL — ABNORMAL HIGH (ref 65–99)
Potassium: 4.2 mmol/L (ref 3.5–5.1)
SODIUM: 135 mmol/L (ref 135–145)
TOTAL PROTEIN: 7.1 g/dL (ref 6.5–8.1)

## 2016-09-01 LAB — CBC WITH DIFFERENTIAL/PLATELET
BASOS ABS: 0 10*3/uL (ref 0.0–0.1)
BASOS PCT: 0 %
Eosinophils Absolute: 0.5 10*3/uL (ref 0.0–0.7)
Eosinophils Relative: 7 %
HCT: 31.8 % — ABNORMAL LOW (ref 39.0–52.0)
HEMOGLOBIN: 10.9 g/dL — AB (ref 13.0–17.0)
Lymphocytes Relative: 21 %
Lymphs Abs: 1.5 10*3/uL (ref 0.7–4.0)
MCH: 30.8 pg (ref 26.0–34.0)
MCHC: 34.3 g/dL (ref 30.0–36.0)
MCV: 89.8 fL (ref 78.0–100.0)
Monocytes Absolute: 0.5 10*3/uL (ref 0.1–1.0)
Monocytes Relative: 6 %
NEUTROS PCT: 66 %
Neutro Abs: 4.9 10*3/uL (ref 1.7–7.7)
Platelets: 268 10*3/uL (ref 150–400)
RBC: 3.54 MIL/uL — AB (ref 4.22–5.81)
RDW: 13.1 % (ref 11.5–15.5)
WBC: 7.4 10*3/uL (ref 4.0–10.5)

## 2016-09-01 LAB — ETHANOL: ALCOHOL ETHYL (B): 273 mg/dL — AB (ref <5)

## 2016-09-01 MED ORDER — CEPHALEXIN 500 MG PO CAPS: 500.0000 mg | ORAL_CAPSULE | Freq: Four times a day (QID) | ORAL | 0 refills | Status: AC

## 2016-09-01 MED ORDER — CEPHALEXIN 250 MG PO CAPS
500.0000 mg | ORAL_CAPSULE | Freq: Once | ORAL | Status: AC
  Administered 2016-09-01: 500 mg via ORAL
  Filled 2016-09-01: qty 2

## 2016-09-01 MED ORDER — LIDOCAINE HCL (PF) 2 % IJ SOLN
20.0000 mL | Freq: Once | INTRAMUSCULAR | Status: AC
  Administered 2016-09-01: 20 mL via INTRADERMAL
  Filled 2016-09-01: qty 20

## 2016-09-01 MED ORDER — BACITRACIN-NEOMYCIN-POLYMYXIN OINTMENT TUBE
TOPICAL_OINTMENT | Freq: Two times a day (BID) | CUTANEOUS | Status: DC
  Administered 2016-09-01: 04:00:00 via TOPICAL
  Filled 2016-09-01: qty 14.17

## 2016-09-01 NOTE — ED Notes (Signed)
Attempted to reach family members by phone without success. Patient requested I try his brother again.  His name is Gwyndolyn Saxon.  Number is [phone].

## 2016-09-01 NOTE — ED Notes (Signed)
Left ear continues to bleed despite holding pressure.  Physician made aware.  Using combat gauze and pressure at this time.  To continue to monitor.

## 2016-09-01 NOTE — ED Notes (Signed)
Joseph Mcdonald (son) will come pick up patient around 9:15 to 9:45

## 2016-09-01 NOTE — ED Notes (Signed)
Attempted to contact spouse, Levada Dy - no response.

## 2016-09-01 NOTE — ED Notes (Signed)
This RN called patient's brother to come and pick up patient. No answer. Will try again.

## 2016-09-01 NOTE — ED Notes (Signed)
Found wandering around room.  Bedpans in cabinet noted to be full of urine.  Assisted back to bed.

## 2016-09-01 NOTE — ED Notes (Signed)
Bleeding to left ear under control at this time.  Head wrapped per provider request.  Tolerated well.

## 2016-09-24 ENCOUNTER — Ambulatory Visit: Payer: Medicare Other | Admitting: *Deleted

## 2016-09-26 ENCOUNTER — Encounter: Payer: Self-pay | Admitting: Student

## 2016-10-08 ENCOUNTER — Other Ambulatory Visit: Payer: Self-pay | Admitting: *Deleted

## 2016-10-08 DIAGNOSIS — E1122 Type 2 diabetes mellitus with diabetic chronic kidney disease: Secondary | ICD-10-CM

## 2016-10-08 DIAGNOSIS — N184 Chronic kidney disease, stage 4 (severe): Principal | ICD-10-CM

## 2016-10-08 MED ORDER — LIRAGLUTIDE 18 MG/3ML ~~LOC~~ SOPN: PEN_INJECTOR | SUBCUTANEOUS | 0 refills | Status: DC

## 2016-10-15 ENCOUNTER — Other Ambulatory Visit: Payer: Self-pay | Admitting: *Deleted

## 2016-10-15 DIAGNOSIS — E1122 Type 2 diabetes mellitus with diabetic chronic kidney disease: Secondary | ICD-10-CM

## 2016-10-15 DIAGNOSIS — N184 Chronic kidney disease, stage 4 (severe): Principal | ICD-10-CM

## 2016-10-15 MED ORDER — LIRAGLUTIDE 18 MG/3ML ~~LOC~~ SOPN: PEN_INJECTOR | SUBCUTANEOUS | 3 refills | Status: DC

## 2016-10-17 ENCOUNTER — Other Ambulatory Visit: Payer: Self-pay | Admitting: Student

## 2016-10-17 DIAGNOSIS — N184 Chronic kidney disease, stage 4 (severe): Principal | ICD-10-CM

## 2016-10-17 DIAGNOSIS — E1122 Type 2 diabetes mellitus with diabetic chronic kidney disease: Secondary | ICD-10-CM

## 2016-10-17 MED ORDER — LIRAGLUTIDE 18 MG/3ML ~~LOC~~ SOPN: PEN_INJECTOR | SUBCUTANEOUS | 3 refills | Status: DC

## 2016-10-24 ENCOUNTER — Ambulatory Visit: Payer: Medicare Other | Admitting: Student

## 2016-11-18 ENCOUNTER — Other Ambulatory Visit: Payer: Self-pay | Admitting: Student

## 2016-11-18 ENCOUNTER — Telehealth: Payer: Self-pay | Admitting: *Deleted

## 2016-11-18 DIAGNOSIS — G8929 Other chronic pain: Secondary | ICD-10-CM

## 2016-11-18 DIAGNOSIS — M5440 Lumbago with sciatica, unspecified side: Principal | ICD-10-CM

## 2016-11-18 MED ORDER — OXYCODONE-ACETAMINOPHEN 5-325 MG PO TABS: 1.0000 | ORAL_TABLET | Freq: Three times a day (TID) | ORAL | 0 refills | Status: DC | PRN

## 2016-11-18 NOTE — Telephone Encounter (Signed)
Printed his Percocet prescription and left at the front desk. Called the patient and notified him.

## 2016-11-18 NOTE — Progress Notes (Signed)
Printed his prescription for Percocet and left at the front desk for patient to pick up.

## 2016-11-18 NOTE — Telephone Encounter (Signed)
Patient states he has an appointment with PCP on 9/13 but will be out of oxycodone before then. Patient requesting enough medication to last him until his appointment.

## 2016-11-18 NOTE — Telephone Encounter (Signed)
Patient called again stating he will be out of medication Saturday.  Derl Barrow, RN

## 2016-12-05 ENCOUNTER — Encounter: Payer: Self-pay | Admitting: Student

## 2016-12-05 ENCOUNTER — Ambulatory Visit (INDEPENDENT_AMBULATORY_CARE_PROVIDER_SITE_OTHER): Payer: Medicare Other | Admitting: Student

## 2016-12-05 VITALS — BP 154/60 | HR 58 | Temp 98.7°F | Ht 64.0 in | Wt 174.0 lb

## 2016-12-05 DIAGNOSIS — E1142 Type 2 diabetes mellitus with diabetic polyneuropathy: Secondary | ICD-10-CM

## 2016-12-05 DIAGNOSIS — M5440 Lumbago with sciatica, unspecified side: Secondary | ICD-10-CM | POA: Diagnosis not present

## 2016-12-05 DIAGNOSIS — N184 Chronic kidney disease, stage 4 (severe): Secondary | ICD-10-CM

## 2016-12-05 DIAGNOSIS — Z794 Long term (current) use of insulin: Secondary | ICD-10-CM

## 2016-12-05 DIAGNOSIS — E1122 Type 2 diabetes mellitus with diabetic chronic kidney disease: Secondary | ICD-10-CM

## 2016-12-05 DIAGNOSIS — Z23 Encounter for immunization: Secondary | ICD-10-CM

## 2016-12-05 DIAGNOSIS — I10 Essential (primary) hypertension: Secondary | ICD-10-CM

## 2016-12-05 DIAGNOSIS — G8929 Other chronic pain: Secondary | ICD-10-CM

## 2016-12-05 LAB — POCT GLYCOSYLATED HEMOGLOBIN (HGB A1C): Hemoglobin A1C: 7.6

## 2016-12-05 MED ORDER — OXYCODONE-ACETAMINOPHEN 5-325 MG PO TABS: 1.0000 | ORAL_TABLET | Freq: Three times a day (TID) | ORAL | 0 refills | Status: DC | PRN

## 2016-12-05 MED ORDER — GLUCAGON (RDNA) 1 MG IJ KIT: 1.0000 mg | PACK | Freq: Once | INTRAMUSCULAR | 12 refills | Status: DC | PRN

## 2016-12-05 MED ORDER — INSULIN GLARGINE 100 UNIT/ML SOLOSTAR PEN: 40.0000 [IU] | PEN_INJECTOR | Freq: Every day | SUBCUTANEOUS | 11 refills | Status: DC

## 2016-12-05 MED ORDER — INSULIN GLARGINE 100 UNIT/ML SOLOSTAR PEN: 20.0000 [IU] | PEN_INJECTOR | Freq: Every day | SUBCUTANEOUS | 11 refills | Status: DC

## 2016-12-05 NOTE — Assessment & Plan Note (Signed)
Chronic issue. No red flags for cauda equina, fracture, infection or malignancy. [place] database reviewed and no red flags noted.  -Refilled his Percocet for the next 3 months. -He has narcan at home

## 2016-12-05 NOTE — Patient Instructions (Signed)
It was great seeing you today! We have addressed the following issues today Diabetes: your A1c is 7.6% today. It was 8.4%. Your goal A1c is <8.0%. See below for more information about A1c.  1. Check your blood glucose 4 times a day (15 minutes before each main meal and about 10 pm) 2-3 days a week and write down the numbers on the book we gave you 2. Inject 20 units of Lantus before bedtime (at 10 PM) 3. Inject 7 units of NovoLog 3 times a day (15 minutes before each meal) 4. Continue using your Victoza.  5. Eat regular meals (breakfast, lunch and dinner) around-the-clock. You may snack as needed. See below about few tips and recommendations on diet and exercise.  6. Watch for symptoms of low blood sugar (hypoglycemia). Read below about these symptoms and management. 7. Please come back and see [place] in three months 8. Please bring all your medication bottles to that visit.   Back pain: I have refilled your pain medications today.  What is A1c:  The A1C test result reflects your average blood sugar level for the past two to three months. Specifically, the A1C test measures what percentage of your hemoglobin - a protein in red blood cells that carries oxygen - is coated with sugar (glycated). The higher your A1C level, the poorer your blood sugar control and the higher your risk of diabetes complications. Portion Size    Choose healthier foods such as 100% whole grains, vegetables, fruits, beans, nut seeds, olive oil, most vegetable oils, fat-free dietary, wild game and fish.   Avoid sweet tea, other sweetened beverages, soda, fruit juice, cold cereal and milk and trans fat.   Eat at least 3 meals and 1-2 snacks per day.  Aim for no more than 5 hours between eating.  Eat breakfast within one hour of getting up.    Exercise at least 150 minutes per week, including weight resistance exercises 3 or 4 times per week.   Try to lose at least 7-10% of your current body  weight.   Hypoglycemia Hypoglycemia is when the sugar (glucose) level in the blood is too low. Symptoms of low blood sugar may include:  Feeling: ? Hungry. ? Worried or nervous (anxious). ? Sweaty and clammy. ? Confused. ? Dizzy. ? Sleepy. ? Sick to your stomach (nauseous).  Having: ? A fast heartbeat. ? A headache. ? A change in your vision. ? Jerky movements that you cannot control (seizure). ? Nightmares. ? Tingling or no feeling (numbness) around the mouth, lips, or tongue.  Having trouble with: ? Talking. ? Paying attention (concentrating). ? Moving (coordination). ? Sleeping.  Shaking.  Passing out (fainting).  Getting upset easily (irritability).  Low blood sugar can happen to people who have diabetes and people who do not have diabetes. Low blood sugar can happen quickly, and it can be an emergency. Treating Low Blood Sugar Low blood sugar is often treated by eating or drinking something sugary right away. If you can think clearly and swallow safely, follow the 15:15 rule:  Take 15 grams of a fast-acting carb (carbohydrate). Some fast-acting carbs are: ? 1 tube of glucose gel. ? 3 sugar tablets (glucose pills). ? 6-8 pieces of hard candy. ? 4 oz (120 mL) of fruit juice. ? 4 oz (120 mL) of regular (not diet) soda.  Check your blood sugar 15 minutes after you take the carb.  If your blood sugar is still at or below 70 mg/dL (3.9 mmol/L), take  15 grams of a carb again.  If your blood sugar does not go above 70 mg/dL (3.9 mmol/L) after 3 tries, get help right away.  After your blood sugar goes back to normal, eat a meal or a snack within 1 hour.  Treating Very Low Blood Sugar If your blood sugar is at or below 54 mg/dL (3 mmol/L), you have very low blood sugar (severe hypoglycemia). This is an emergency. Do not wait to see if the symptoms will go away. Get medical help right away. Call your local emergency services (911 in the [place] Do not drive yourself to  the hospital. If you have very low blood sugar and you cannot eat or drink, you may need a glucagon shot (injection). A family member or friend should learn how to check your blood sugar and how to give you a glucagon shot. Ask your doctor if you need to have a glucagon shot kit at home. Follow these instructions at home: General instructions  Avoid any diets that cause you to not eat enough food. Talk with your doctor before you start any new diet.  Take over-the-counter and prescription medicines only as told by your doctor.  Limit alcohol to no more than 1 drink per day for nonpregnant women and 2 drinks per day for men. One drink equals 12 oz of beer, 5 oz of wine, or 1 oz of hard liquor.  Keep all follow-up visits as told by your doctor. This is important. If You Have Diabetes:   Make sure you know the symptoms of low blood sugar.  Always keep a source of sugar with you, such as: ? Sugar. ? Sugar tablets. ? Glucose gel. ? Fruit juice. ? Regular soda (not diet soda). ? Milk. ? Hard candy. ? Honey.  Take your medicines as told.  Follow your exercise and meal plan. ? Eat on time. Do not skip meals. ? Follow your sick day plan when you cannot eat or drink normally. Make this plan ahead of time with your doctor.  Check your blood sugar as often as told by your doctor. Always check before and after exercise.  Share your diabetes care plan with: ? Your work or school. ? People you live with.  Check your pee (urine) for ketones: ? When you are sick. ? As told by your doctor.  Carry a card or wear jewelry that says you have diabetes. If You Have Low Blood Sugar From Other Causes:   Check your blood sugar as often as told by your doctor.  Follow instructions from your doctor about what you cannot eat or drink. Contact a doctor if:  You have trouble keeping your blood sugar in your target range.  You have low blood sugar often. Get help right away if:  You still  have symptoms after you eat or drink something sugary.  Your blood sugar is at or below 54 mg/dL (3 mmol/L).  You have jerky movements that you cannot control.  You pass out. These symptoms may be an emergency. Do not wait to see if the symptoms will go away. Get medical help right away. Call your local emergency services (911 in the [place] Do not drive yourself to the hospital. This information is not intended to replace advice given to you by your health care provider. Make sure you discuss any questions you have with your health care provider. Document Released: 06/05/2009 Document Revised: 08/17/2015 Document Reviewed: 04/14/2015 Elsevier Interactive Patient Education  [employer].

## 2016-12-05 NOTE — Assessment & Plan Note (Signed)
Well controlled. A1c down from 8.4-7.6 today. Unfortunately, he is not sure if he is using Lantus or not. He was supposed to be on Lantus 40 units daily.  1. Check your blood glucose 4 times a day (15 minutes before each main meal and about 10 pm) 2-3 days a week and write down the numbers on the book we gave you 2. Inject 20 units of Lantus before bedtime (at 10 PM) 3. Inject 7 units of NovoLog 3 times a day (15 minutes before each meal) 4. Continue using your Victoza.  5. Eat regular meals (breakfast, lunch and dinner) around-the-clock. You may snack as needed. Handout given. 6. Watch for symptoms of low blood sugar (hypoglycemia). Handout given. 7. Please come back and see [place] in three months 8. Please bring all your medication bottles to that visit.  - glucagon 1 MG injection; Inject 1 mg into the muscle once as needed.  Dispense: 1 each; Refill: 12

## 2016-12-05 NOTE — Assessment & Plan Note (Signed)
Systolic blood pressure elevated to 150s. Diastolic is only 60. Lisinopril was discontinued due to worsening renal function. He has history of orthostatic syncope in the past. -Will continue amlodipine. He is at risks for fall with low DBP and bradycardia. He also had history of orthostasis in the past. - Basic metabolic panel

## 2016-12-05 NOTE — Progress Notes (Signed)
Subjective:    Joseph Mcdonald is a 66 y.o. old male here for follow up on his diabetes and back pain. Patient is here with his daughter.  HPI Diabetes: checks his blood glucose at home. He says he forgot to bring his blood glucose logs to the visit. Lowest fasting blood glucose 81. The highest is 254. Mostly below 200. He remembers using Novolog 7 units three times a day but doesn't remember using Lantus. He uses Victoza. Walking everyday. Denies drinking soda, juice, beer. Reports eating vegetables. Denies vision change. He is followed by an eye doctor. Last visit about 10 months ago but the exam was incomplete. Denies numbness in his legs.   Back pain: chronic issue. No changes. Pain likely due to degenerative disc disease/osteoarthritis. He is on chronic opiates. He states that pain medicine helped him move around more. He walks daily. He denies fever, bowel or bladder issue, saddle anesthesia, numbness or tingling, and expected weight change or night sweats. He states he stumbled and fell once three months ago. No significant injury.   HTN: lisinopril stopped by nephrologist due to worsening renal function. Reports taking his Norvasc this morning.  PMH/Problem List: has Hyperlipidemia; Diabetic neuropathy ([place]); Essential hypertension; Coronary artery disease involving native heart without angina pectoris; Alcohol withdrawal ([place]); Type 2 diabetes mellitus ([place]); Depression; Generalized anxiety disorder; Chronic back pain; Secondary hyperparathyroidism of renal origin ([place]); Anemia of chronic disease; Abnormal ECG; Syncope and collapse; Chronic kidney disease, stage 4 (severe) ([place]); History of ETOH abuse; Routine adult health maintenance; Gout; Insomnia; Bradycardia; and Basal cell carcinoma on his problem list.   has a past medical history of Anxiety; Depression; Diabetes mellitus without complication ([place]); Hypertension; and Peripheral neuropathy.  FH:  Family History  Problem Relation Age of Onset   . Heart attack Father   . Heart attack Brother   . Heart attack Brother     [facility] Social History  Substance Use Topics  . Smoking status: Never Smoker  . Smokeless tobacco: Never Used  . Alcohol use No    Review of Systems Review of systems negative except for pertinent positives and negatives in history of present illness above.     Objective:     Vitals:   12/05/16 1101  BP: (!) 154/60  Pulse: (!) 58  Temp: 98.7 F (37.1 C)  TempSrc: Oral  SpO2: 98%  Weight: 174 lb (78.9 kg)  Height: 5\' 4"  (1.626 m)   Body mass index is 29.87 kg/m.  Physical Exam GEN: appears well, no apparent distress. CVS: bradycardic to 56,RR, nl S1&S2, no murmurs, trace edema RESP: no IWOB, good air movement bilaterally, CTAB MSK: no focal tenderness or notable swelling. No tenderness to palpation over paraspinal spinal muscles. Neurovascular exam within normal limits in both lower extremities. He has good range of motion in lumbar spines. SKIN: no apparent skin lesion NEURO: alert and oiented appropriately, no gross deficits  PSYCH: euthymic mood with congruent affect    Assessment and Plan:  1. Type 2 diabetes mellitus with stage 4 chronic kidney disease, with long-term current use of insulin ([facility]) Well controlled. A1c down from 8.4-7.6 today. Unfortunately, he is not sure if he is using Lantus or not. He was supposed to be on Lantus 40 units daily.  1. Check your blood glucose 4 times a day (15 minutes before each main meal and about 10 pm) 2-3 days a week and write down the numbers on the book we gave you 2. Inject 20 units of Lantus  before bedtime (at 10 PM) 3. Inject 7 units of NovoLog 3 times a day (15 minutes before each meal) 4. Continue using your Victoza.  5. Eat regular meals (breakfast, lunch and dinner) around-the-clock. You may snack as needed. Handout given. 6. Watch for symptoms of low blood sugar (hypoglycemia). Handout given. 7. Please come back and see [place] in three  months 8. Please bring all your medication bottles to that visit.  - glucagon 1 MG injection; Inject 1 mg into the muscle once as needed.  Dispense: 1 each; Refill: 12  2. Chronic low back pain with sciatica, sciatica laterality unspecified, unspecified back pain laterality: chronic issue. No red flags for cauda equina, fracture, infection or malignancy. [employer] database reviewed and no red flags noted.  -Refilled his Percocet for the next 3 months. -He has narcan at home  3. Essential hypertension: systolic blood pressure elevated to 150s. Diastolic is only 60. Lisinopril was discontinued due to worsening renal function. He has history of orthostatic syncope in the past. -Will continue amlodipine. He is at risks for fall with low DBP and bradycardia. He also had history of orthostasis in the past. - Basic metabolic panel  4. Need for immunization against influenza - Flu Vaccine QUAD 36+ mos IM  5. Screening for colon cancer: I urged him to get his colonoscopy. Gave him phone numbers to call and schedule.  Return in about 1 month (around 01/04/2017) for Annual wellness visit.  Mercy Riding, MD 12/05/16 Pager: [phone]

## 2016-12-06 LAB — BASIC METABOLIC PANEL
BUN / CREAT RATIO: 14 (ref 10–24)
BUN: 45 mg/dL — ABNORMAL HIGH (ref 8–27)
CHLORIDE: 102 mmol/L (ref 96–106)
CO2: 18 mmol/L — ABNORMAL LOW (ref 20–29)
Calcium: 9 mg/dL (ref 8.6–10.2)
Creatinine, Ser: 3.27 mg/dL — ABNORMAL HIGH (ref 0.76–1.27)
GFR calc Af Amer: 21 mL/min/{1.73_m2} — ABNORMAL LOW (ref >59)
GFR calc non Af Amer: 19 mL/min/{1.73_m2} — ABNORMAL LOW (ref >59)
GLUCOSE: 261 mg/dL — AB (ref 65–99)
Potassium: 4.9 mmol/L (ref 3.5–5.2)
SODIUM: 137 mmol/L (ref 134–144)

## 2017-01-08 ENCOUNTER — Ambulatory Visit (INDEPENDENT_AMBULATORY_CARE_PROVIDER_SITE_OTHER): Payer: Medicare Other | Admitting: *Deleted

## 2017-01-08 ENCOUNTER — Encounter: Payer: Self-pay | Admitting: *Deleted

## 2017-01-08 ENCOUNTER — Other Ambulatory Visit: Payer: Self-pay | Admitting: *Deleted

## 2017-01-08 VITALS — BP 166/64 | HR 44 | Temp 97.6°F | Ht 64.0 in | Wt 182.8 lb

## 2017-01-08 DIAGNOSIS — Z23 Encounter for immunization: Secondary | ICD-10-CM

## 2017-01-08 DIAGNOSIS — Z0000 Encounter for general adult medical examination without abnormal findings: Secondary | ICD-10-CM

## 2017-01-08 DIAGNOSIS — M109 Gout, unspecified: Secondary | ICD-10-CM

## 2017-01-08 DIAGNOSIS — Z1211 Encounter for screening for malignant neoplasm of colon: Secondary | ICD-10-CM

## 2017-01-08 NOTE — Patient Instructions (Addendum)
Joseph Mcdonald, Thank you for taking time to come for yourMedicare Wellness Visit. I appreciate your ongoing commitment to your health goals. Please review the following plan we discussed and let me know if I can assist you in the future.   These are the goals we discussed:  Goals    . maintain current level of physical activity          Walking 30 minutes every day    . Weight (lb) < 167 lb (75.8 kg) (pt-stated)          7 % weight loss        Diabetes and Foot Care Diabetes may cause you to have problems because of poor blood supply (circulation) to your feet and legs. This may cause the skin on your feet to become thinner, break easier, and heal more slowly. Your skin may become dry, and the skin may peel and crack. You may also have nerve damage in your legs and feet causing decreased feeling in them. You may not notice minor injuries to your feet that could lead to infections or more serious problems. Taking care of your feet is one of the most important things you can do for yourself. Follow these instructions at home:  Wear shoes at all times, even in the house. Do not go barefoot. Bare feet are easily injured.  Check your feet daily for blisters, cuts, and redness. If you cannot see the bottom of your feet, use a mirror or ask someone for help.  Wash your feet with warm water (do not use hot water) and mild soap. Then pat your feet and the areas between your toes until they are completely dry. Do not soak your feet as this can dry your skin.  Apply a moisturizing lotion or petroleum jelly (that does not contain alcohol and is unscented) to the skin on your feet and to dry, brittle toenails. Do not apply lotion between your toes.  Trim your toenails straight across. Do not dig under them or around the cuticle. File the edges of your nails with an emery board or nail file.  Do not cut corns or calluses or try to remove them with medicine.  Wear clean socks or stockings every  day. Make sure they are not too tight. Do not wear knee-high stockings since they may decrease blood flow to your legs.  Wear shoes that fit properly and have enough cushioning. To break in new shoes, wear them for just a few hours a day. This prevents you from injuring your feet. Always look in your shoes before you put them on to be sure there are no objects inside.  Do not cross your legs. This may decrease the blood flow to your feet.  If you find a minor scrape, cut, or break in the skin on your feet, keep it and the skin around it clean and dry. These areas may be cleansed with mild soap and water. Do not cleanse the area with peroxide, alcohol, or iodine.  When you remove an adhesive bandage, be sure not to damage the skin around it.  If you have a wound, look at it several times a day to make sure it is healing.  Do not use heating pads or hot water bottles. They may burn your skin. If you have lost feeling in your feet or legs, you may not know it is happening until it is too late.  Make sure your health care provider performs a complete foot  exam at least annually or more often if you have foot problems. Report any cuts, sores, or bruises to your health care provider immediately. Contact a health care provider if:  You have an injury that is not healing.  You have cuts or breaks in the skin.  You have an ingrown nail.  You notice redness on your legs or feet.  You feel burning or tingling in your legs or feet.  You have pain or cramps in your legs and feet.  Your legs or feet are numb.  Your feet always feel cold. Get help right away if:  There is increasing redness, swelling, or pain in or around a wound.  There is a red line that goes up your leg.  Pus is coming from a wound.  You develop a fever or as directed by your health care provider.  You notice a bad smell coming from an ulcer or wound. This information is not intended to replace advice given to you by  your health care provider. Make sure you discuss any questions you have with your health care provider. Document Released: 03/08/2000 Document Revised: 08/17/2015 Document Reviewed: 08/18/2012 Elsevier Interactive Patient Education  2017 [place] Prevention in the Home Falls can cause injuries. They can happen to people of all ages. There are many things you can do to make your home safe and to help prevent falls. What can I do on the outside of my home?  Regularly fix the edges of walkways and driveways and fix any cracks.  Remove anything that might make you trip as you walk through a door, such as a raised step or threshold.  Trim any bushes or trees on the path to your home.  Use bright outdoor lighting.  Clear any walking paths of anything that might make someone trip, such as rocks or tools.  Regularly check to see if handrails are loose or broken. Make sure that both sides of any steps have handrails.  Any raised decks and porches should have guardrails on the edges.  Have any leaves, snow, or ice cleared regularly.  Use sand or salt on walking paths during winter.  Clean up any spills in your garage right away. This includes oil or grease spills. What can I do in the bathroom?  Use night lights.  Install grab bars by the toilet and in the tub and shower. Do not use towel bars as grab bars.  Use non-skid mats or decals in the tub or shower.  If you need to sit down in the shower, use a plastic, non-slip stool.  Keep the floor dry. Clean up any water that spills on the floor as soon as it happens.  Remove soap buildup in the tub or shower regularly.  Attach bath mats securely with double-sided non-slip rug tape.  Do not have throw rugs and other things on the floor that can make you trip. What can I do in the bedroom?  Use night lights.  Make sure that you have a light by your bed that is easy to reach.  Do not use any sheets or blankets that are  too big for your bed. They should not hang down onto the floor.  Have a firm chair that has side arms. You can use this for support while you get dressed.  Do not have throw rugs and other things on the floor that can make you trip. What can I do in the kitchen?  Clean up any  spills right away.  Avoid walking on wet floors.  Keep items that you use a lot in easy-to-reach places.  If you need to reach something above you, use a strong step stool that has a grab bar.  Keep electrical cords out of the way.  Do not use floor polish or wax that makes floors slippery. If you must use wax, use non-skid floor wax.  Do not have throw rugs and other things on the floor that can make you trip. What can I do with my stairs?  Do not leave any items on the stairs.  Make sure that there are handrails on both sides of the stairs and use them. Fix handrails that are broken or loose. Make sure that handrails are as long as the stairways.  Check any carpeting to make sure that it is firmly attached to the stairs. Fix any carpet that is loose or worn.  Avoid having throw rugs at the top or bottom of the stairs. If you do have throw rugs, attach them to the floor with carpet tape.  Make sure that you have a light switch at the top of the stairs and the bottom of the stairs. If you do not have them, ask someone to add them for you. What else can I do to help prevent falls?  Wear shoes that: ? Do not have high heels. ? Have rubber bottoms. ? Are comfortable and fit you well. ? Are closed at the toe. Do not wear sandals.  If you use a stepladder: ? Make sure that it is fully opened. Do not climb a closed stepladder. ? Make sure that both sides of the stepladder are locked into place. ? Ask someone to hold it for you, if possible.  Clearly mark and make sure that you can see: ? Any grab bars or handrails. ? First and last steps. ? Where the edge of each step is.  Use tools that help you move  around (mobility aids) if they are needed. These include: ? Canes. ? Walkers. ? Scooters. ? Crutches.  Turn on the lights when you go into a dark area. Replace any light bulbs as soon as they burn out.  Set up your furniture so you have a clear path. Avoid moving your furniture around.  If any of your floors are uneven, fix them.  If there are any pets around you, be aware of where they are.  Review your medicines with your doctor. Some medicines can make you feel dizzy. This can increase your chance of falling. Ask your doctor what other things that you can do to help prevent falls. This information is not intended to replace advice given to you by your health care provider. Make sure you discuss any questions you have with your health care provider. Document Released: 01/05/2009 Document Revised: 08/17/2015 Document Reviewed: 04/15/2014 Elsevier Interactive Patient Education  2018 [place] Maintenance, Male A healthy lifestyle and preventive care is important for your health and wellness. Ask your health care provider about what schedule of regular examinations is right for you. What should I know about weight and diet? Eat a Healthy Diet  Eat plenty of vegetables, fruits, whole grains, low-fat dairy products, and lean protein.  Do not eat a lot of foods high in solid fats, added sugars, or salt.  Maintain a Healthy Weight Regular exercise can help you achieve or maintain a healthy weight. You should:  Do at least 150 minutes of exercise each  week. The exercise should increase your heart rate and make you sweat (moderate-intensity exercise).  Do strength-training exercises at least twice a week.  Watch Your Levels of Cholesterol and Blood Lipids  Have your blood tested for lipids and cholesterol every 5 years starting at 67 years of age. If you are at high risk for heart disease, you should start having your blood tested when you are 67 years old. You may need  to have your cholesterol levels checked more often if: ? Your lipid or cholesterol levels are high. ? You are older than 67 years of age. ? You are at high risk for heart disease.  What should I know about cancer screening? Many types of cancers can be detected early and may often be prevented. Lung Cancer  You should be screened every year for lung cancer if: ? You are a current smoker who has smoked for at least 30 years. ? You are a former smoker who has quit within the past 15 years.  Talk to your health care provider about your screening options, when you should start screening, and how often you should be screened.  Colorectal Cancer  Routine colorectal cancer screening usually begins at 67 years of age and should be repeated every 5-10 years until you are 67 years old. You may need to be screened more often if early forms of precancerous polyps or small growths are found. Your health care provider may recommend screening at an earlier age if you have risk factors for colon cancer.  Your health care provider may recommend using home test kits to check for hidden blood in the stool.  A small camera at the end of a tube can be used to examine your colon (sigmoidoscopy or colonoscopy). This checks for the earliest forms of colorectal cancer.  Prostate and Testicular Cancer  Depending on your age and overall health, your health care provider may do certain tests to screen for prostate and testicular cancer.  Talk to your health care provider about any symptoms or concerns you have about testicular or prostate cancer.  Skin Cancer  Check your skin from head to toe regularly.  Tell your health care provider about any new moles or changes in moles, especially if: ? There is a change in a mole's size, shape, or color. ? You have a mole that is larger than a pencil eraser.  Always use sunscreen. Apply sunscreen liberally and repeat throughout the day.  Protect yourself by wearing  long sleeves, pants, a wide-brimmed hat, and sunglasses when outside.  What should I know about heart disease, diabetes, and high blood pressure?  If you are 24-71 years of age, have your blood pressure checked every 3-5 years. If you are 48 years of age or older, have your blood pressure checked every year. You should have your blood pressure measured twice-once when you are at a hospital or clinic, and once when you are not at a hospital or clinic. Record the average of the two measurements. To check your blood pressure when you are not at a hospital or clinic, you can use: ? An automated blood pressure machine at a pharmacy. ? A home blood pressure monitor.  Talk to your health care provider about your target blood pressure.  If you are between 98-25 years old, ask your health care provider if you should take aspirin to prevent heart disease.  Have regular diabetes screenings by checking your fasting blood sugar level. ? If you are at a  normal weight and have a low risk for diabetes, have this test once every three years after the age of 35. ? If you are overweight and have a high risk for diabetes, consider being tested at a younger age or more often.  A one-time screening for abdominal aortic aneurysm (AAA) by ultrasound is recommended for men aged 16-75 years who are current or former smokers. What should I know about preventing infection? Hepatitis B If you have a higher risk for hepatitis B, you should be screened for this virus. Talk with your health care provider to find out if you are at risk for hepatitis B infection. Hepatitis C Blood testing is recommended for:  Everyone born from [birth year] through 1966.  Anyone with known risk factors for hepatitis C.  Sexually Transmitted Diseases (STDs)  You should be screened each year for STDs including gonorrhea and chlamydia if: ? You are sexually active and are younger than 68 years of age. ? You are older than 67 years of age and your  health care provider tells you that you are at risk for this type of infection. ? Your sexual activity has changed since you were last screened and you are at an increased risk for chlamydia or gonorrhea. Ask your health care provider if you are at risk.  Talk with your health care provider about whether you are at high risk of being infected with HIV. Your health care provider may recommend a prescription medicine to help prevent HIV infection.  What else can I do?  Schedule regular health, dental, and eye exams.  Stay current with your vaccines (immunizations).  Do not use any tobacco products, such as cigarettes, chewing tobacco, and e-cigarettes. If you need help quitting, ask your health care provider.  Limit alcohol intake to no more than 2 drinks per day. One drink equals 12 ounces of beer, 5 ounces of wine, or 1 ounces of hard liquor.  Do not use street drugs.  Do not share needles.  Ask your health care provider for help if you need support or information about quitting drugs.  Tell your health care provider if you often feel depressed.  Tell your health care provider if you have ever been abused or do not feel safe at home. This information is not intended to replace advice given to you by your health care provider. Make sure you discuss any questions you have with your health care provider. Document Released: 09/07/2007 Document Revised: 11/08/2015 Document Reviewed: 12/13/2014 Elsevier Interactive Patient Education  [employer].

## 2017-01-08 NOTE — Progress Notes (Signed)
Subjective:   Joseph Mcdonald is a 66 y.o. male who presents with daughter for an Initial Medicare Annual Wellness Visit.  Cardiac Risk Factors include: advanced age (>33mn, >>40women);diabetes mellitus;dyslipidemia;hypertension;male gender;family history of premature cardiovascular disease;obesity (BMI >30kg/m2)    Objective:    Today's Vitals   01/08/17 1110  BP: (!) 172/50  Pulse: (!) 43  Temp: 97.6 F (36.4 C)  SpO2: 99%  Weight: 182 lb 12.8 oz (82.9 kg)  Height: '5\' 4"'  (1.626 m)  PainSc: 5   PainLoc: Back   Body mass index is 31.38 kg/m.  BP recheck 170/60 on right 166/64 on left HR recheck 44 verified manually  Patient states he has white coat syndrome and would like to see what BP is at pharmacy. Patient will schedule appt with PCP if pharmacy reading > 1162systolic.  Current Medications (verified) Outpatient Encounter Prescriptions as of 01/08/2017  Medication Sig  . allopurinol (ZYLOPRIM) 100 MG tablet Take 1 tablet (100 mg total) by mouth daily.  .Marland KitchenamLODipine (NORVASC) 10 MG tablet Take 1 tablet (10 mg total) by mouth daily.  .Marland Kitchenaspirin 81 MG tablet Take 1 tablet (81 mg total) by mouth daily.  .Marland Kitchenatorvastatin (LIPITOR) 40 MG tablet Take 1 tablet (40 mg total) by mouth daily.  . Blood Glucose Monitoring Suppl (ACCU-CHEK AVIVA PLUS) w/Device KIT 1 kit by Does not apply route 4 (four) times daily -  before meals and at bedtime.  . calcium acetate (PHOSLO) 667 MG capsule Take 667 mg by mouth 3 (three) times daily with meals.  .Marland Kitchenescitalopram (LEXAPRO) 10 MG tablet Take 1 tablet (10 mg total) by mouth daily.  .Marland Kitchengabapentin (NEURONTIN) 300 MG capsule Take 1 capsule (300 mg total) by mouth 2 (two) times daily.  .Marland Kitchenglucose blood (ACCU-CHEK AVIVA PLUS) test strip Checked your blood glucose 4 times a day (before meals and at bedtime)  . insulin aspart (NOVOLOG FLEXPEN) 100 UNIT/ML FlexPen Inject 7 Units into the skin 3 (three) times daily with meals.  . Insulin  Glargine (LANTUS SOLOSTAR) 100 UNIT/ML Solostar Pen Inject 20 Units into the skin daily at 10 pm.  . Insulin Pen Needle 31G X 8 MM MISC Use one needle to inject insulin 4 times a day  . Lancets MISC Use to check your blood glucose 4 times a day (before meals and at bedtime)  . liraglutide 18 MG/3ML SOPN Inject 1.2 mg once daily; if your blood glucose is greater than 140 mg/dL, may increase to 1.8 mg once daily.  .Marland KitchenoxyCODONE-acetaminophen (ROXICET) 5-325 MG tablet Take 1 tablet by mouth every 8 (eight) hours as needed for severe pain.  . polyethylene glycol (MIRALAX / GLYCOLAX) packet Take 17 g by mouth 2 (two) times daily.  . traZODone (DESYREL) 50 MG tablet Take 1 tablet (50 mg total) by mouth at bedtime as needed for sleep.  .Marland Kitchenglucagon 1 MG injection Inject 1 mg into the muscle once as needed. (Patient not taking: Reported on 01/08/2017)  . naloxone (NARCAN) 2 MG/2ML injection Inject 0.4 mLs (0.4 mg total) into the muscle as needed (for opiod overdose. May repeat in 2-3 minutes if needed.).  .Marland KitchenNARCAN 4 MG/0.1ML LIQD nasal spray kit   . [DISCONTINUED] BOOSTRIX 5-2.5-18.5 injection   . [DISCONTINUED] oxyCODONE-acetaminophen (ROXICET) 5-325 MG tablet Take 1 tablet by mouth every 8 (eight) hours as needed for severe pain.  . [DISCONTINUED] oxyCODONE-acetaminophen (ROXICET) 5-325 MG tablet Take 1 tablet by mouth every 8 (eight) hours as needed  for severe pain. (Patient not taking: Reported on 01/08/2017)  . [DISCONTINUED] polyethylene glycol powder (GLYCOLAX/MIRALAX) powder   . [DISCONTINUED] ZOSTAVAX 96283 UNT/0.65ML injection TO BE ADMINISTERED BY PHARMACIST FOR IMMUNIZATION   No facility-administered encounter medications on file as of 01/08/2017.     Allergies (verified) Penicillins   History: Past Medical History:  Diagnosis Date  . Anxiety   . Depression   . Diabetes mellitus without complication ([place])   . Hypertension   . Peripheral neuropathy    Past Surgical History:  Procedure  Laterality Date  . CORONARY ARTERY BYPASS GRAFT     Family History  Problem Relation Age of Onset  . Diabetes Father   . Heart attack Brother   . Heart disease Brother   . Diabetes Mother   . Heart disease Mother   . Congestive Heart Failure Mother   . Heart disease Brother   . Heart attack Brother    Social History   Occupational History  . Disabled/Retired    Social History Main Topics  . Smoking status: Never Smoker  . Smokeless tobacco: Never Used  . Alcohol use No  . Drug use: No  . Sexual activity: Yes   Tobacco Counseling Patient has never smoked and has no plans to start.  Activities of Daily Living In your present state of health, do you have any difficulty performing the following activities: 01/08/2017  Hearing? N  Vision? Y  Difficulty concentrating or making decisions? N  Walking or climbing stairs? N  Dressing or bathing? N  Doing errands, shopping? N  Preparing Food and eating ? N  Using the Toilet? N  In the past six months, have you accidently leaked urine? N  Do you have problems with loss of bowel control? N  Managing your Medications? N  Managing your Finances? N  Housekeeping or managing your Housekeeping? N  Some recent data might be hidden   Home Safety:  My home has a working smoke alarm:  Yes X 2           My home throw rugs have been fastened down to the floor or removed:  No, discussed removing or tacking down I have a non-slip surface or non-slip mats in the bathtub and shower:  Non-slip surface        All my home's stairs have handrails, including any outdoor stairs  One level home with 2 outside steps with handrails          My home's floors, stairs and hallways are free from clutter, wires and cords:  Yes     I have animals in my home  No I wear seatbelts consistently:  Yes   Immunizations and Health Maintenance Immunization History  Administered Date(s) Administered  . Influenza,inj,Quad PF,6+ Mos 11/20/2015, 12/05/2016  .  Pneumococcal Conjugate-13 12/25/2015  . Tdap 01/23/2016  . Zoster 01/22/2016   Health Maintenance Due  Topic Date Due  . COLONOSCOPY  07/22/1999  . PNA vac Low Risk Adult (2 of 2 - PPSV23) 12/24/2016   Pneumovax vaccine administered today Patient given FIT with instructions from lab staff   Patient Care Team: Mercy Riding, MD as PCP - General (Family Medicine) Fay Records, MD as Consulting Physician (Cardiology) Adegoroye, Wynona Luna, MD (Specialist)  Indicate any recent Medical Services you may have received from other than [facility] providers in the past year (date may be approximate).    Assessment:   This is a routine wellness examination for Joseph Mcdonald.   Hearing/Vision  screen  Hearing Screening   Method: Audiometry   '125Hz'  '250Hz'  '500Hz'  '1000Hz'  '2000Hz'  '3000Hz'  '4000Hz'  '6000Hz'  '8000Hz'   Right ear:   40 40 40  Fail    Left ear:   Fail 40 40  Fail      Dietary issues and exercise activities discussed: Current Exercise Habits: Home exercise routine, Type of exercise: walking, Time (Minutes): 30, Frequency (Times/Week): 7, Weekly Exercise (Minutes/Week): 210, Intensity: Mild, Exercise limited by: orthopedic condition(s) (Back pain)  Goals    . maintain current level of physical activity          Walking 30 minutes every day    . Weight (lb) < 167 lb (75.8 kg) (pt-stated)          7 % weight loss      Depression Screen PHQ 2/9 Scores 01/08/2017 12/05/2016 04/18/2016 01/22/2016  PHQ - 2 Score 0 0 0 6  PHQ- 9 Score - - - 15    Fall Risk Fall Risk  01/08/2017 07/18/2016 04/18/2016 01/22/2016 01/04/2016  Falls in the past year? Yes No No No No  Number falls in past yr: 1 - - - -  Injury with Fall? Yes - - - -  Comment ear laceration - - - -  Risk Factor Category  High Fall Risk - - - -  Risk for fall due to : History of fall(s);Impaired balance/gait - - - -  Follow up Falls evaluation completed;Education provided;Falls prevention discussed - - - -   TUG Test:  Done in 10 seconds.  Falls prevention discussed in detail and literature given.  Cognitive Function: Mini-Cog  Failed with score 2/5    Screening Tests Health Maintenance  Topic Date Due  . COLONOSCOPY  07/22/1999  . PNA vac Low Risk Adult (2 of 2 - PPSV23) 12/24/2016  . FOOT EXAM  01/21/2017  . URINE MICROALBUMIN  01/21/2017  . OPHTHALMOLOGY EXAM  02/19/2017  . HEMOGLOBIN A1C  06/04/2017  . TETANUS/TDAP  02/04/2026  . INFLUENZA VACCINE  Completed  . Hepatitis C Screening  Completed        Plan:    Pneumovax vaccine administered today Patient given FIT with instructions from lab staff  Refill request for allopurinol sent in separate message  I have personally reviewed and noted the following in the patient's chart:   . Medical and social history . Use of alcohol, tobacco or illicit drugs  . Current medications and supplements . Functional ability and status . Nutritional status . Physical activity . Advanced directives . List of other physicians . Hospitalizations, surgeries, and ER visits in previous 12 months . Vitals . Screenings to include cognitive, depression, and falls . Referrals and appointments  In addition, I have reviewed and discussed with patient certain preventive protocols, quality metrics, and best practice recommendations. A written personalized care plan for preventive services as well as general preventive health recommendations were provided to patient.     Velora Heckler, RN   01/08/2017

## 2017-01-09 MED ORDER — ALLOPURINOL 100 MG PO TABS: 100.0000 mg | ORAL_TABLET | Freq: Every day | ORAL | 2 refills | Status: DC

## 2017-01-09 NOTE — Progress Notes (Signed)
I have reviewed this visit and discussed with Lauren Ducatte, RN, BSN, and agree with her documentation.   

## 2017-03-12 ENCOUNTER — Other Ambulatory Visit: Payer: Self-pay

## 2017-03-12 ENCOUNTER — Encounter: Payer: Self-pay | Admitting: Student

## 2017-03-12 ENCOUNTER — Ambulatory Visit (INDEPENDENT_AMBULATORY_CARE_PROVIDER_SITE_OTHER): Payer: Medicare Other | Admitting: Student

## 2017-03-12 VITALS — BP 190/72 | HR 54 | Temp 97.5°F | Ht 64.0 in | Wt 171.6 lb

## 2017-03-12 DIAGNOSIS — E1122 Type 2 diabetes mellitus with diabetic chronic kidney disease: Secondary | ICD-10-CM

## 2017-03-12 DIAGNOSIS — Z794 Long term (current) use of insulin: Secondary | ICD-10-CM | POA: Diagnosis not present

## 2017-03-12 DIAGNOSIS — M5440 Lumbago with sciatica, unspecified side: Secondary | ICD-10-CM

## 2017-03-12 DIAGNOSIS — R112 Nausea with vomiting, unspecified: Secondary | ICD-10-CM | POA: Diagnosis not present

## 2017-03-12 DIAGNOSIS — N184 Chronic kidney disease, stage 4 (severe): Secondary | ICD-10-CM | POA: Diagnosis not present

## 2017-03-12 DIAGNOSIS — I160 Hypertensive urgency: Secondary | ICD-10-CM

## 2017-03-12 DIAGNOSIS — I10 Essential (primary) hypertension: Secondary | ICD-10-CM | POA: Diagnosis not present

## 2017-03-12 DIAGNOSIS — G8929 Other chronic pain: Secondary | ICD-10-CM

## 2017-03-12 LAB — POCT GLYCOSYLATED HEMOGLOBIN (HGB A1C): Hemoglobin A1C: 7.7

## 2017-03-12 MED ORDER — CLONIDINE HCL 0.1 MG PO TABS
0.1000 mg | ORAL_TABLET | Freq: Once | ORAL | Status: AC
  Administered 2017-03-12: 0.1 mg via ORAL

## 2017-03-12 MED ORDER — OXYCODONE-ACETAMINOPHEN 5-325 MG PO TABS: 1.0000 | ORAL_TABLET | Freq: Three times a day (TID) | ORAL | 0 refills | Status: DC | PRN

## 2017-03-12 MED ORDER — CLONIDINE 0.1 MG/24HR TD PTWK: 0.1000 mg | MEDICATED_PATCH | TRANSDERMAL | 12 refills | Status: DC

## 2017-03-12 NOTE — Patient Instructions (Signed)
It was great seeing you today! We have addressed the following issues today -Diabetes: A1c 7.7%.  Your last A1c was 7.6% about 3 months ago.  Your goal A1c is less than 7.5%.  Continue using your Lantus and short acting insulin.  You may stop the Victoza until you nausea and vomiting resolves.  I strongly recommend you see your eye doctor at least every year.  I also recommend you see your dentist every 6 months.  Please keep your toenails trimmed.  -Hypertension: We have started you on a clonidine patch.  Please have this prescription filled and start using it.  Please go to emergency department or call 911 if you have chest pain, shortness of breath or other symptoms concerning to you.  Is come back and see [place] in 2 weeks.  -Back pain: We gave you a prescription for Percocet.  If we did any lab work today, and the results require attention, either me or my nurse will get in touch with you. If everything is normal, you will get a letter in mail and a message via . If you don't hear from [place] in two weeks, please give [place] a call. Otherwise, we look forward to seeing you again at your next visit. If you have any questions or concerns before then, please call the clinic at [phone].  Please bring all your medications to every doctors visit  Sign up for My Chart to have easy access to your labs results, and communication with your Primary care physician.    Please check-out at the front desk before leaving the clinic.    Take Care,   Dr. Cyndia Skeeters

## 2017-03-12 NOTE — Progress Notes (Signed)
Subjective:    Joseph Mcdonald is a 66 y.o. old male here for follow up on diabetes. He is here with his daughter  HPI Diabetes: A1c 7.7% today. 7.6% three months ago. Uses novolog 7 units three times a day, Lantus 20 units at bedtime, victoza 0.6 mg daily. Reports good compliance with his medications. Checks blood glucose in the morning and before meals. Fasting blood glucose ranges from 112-187. [place]al blood glucose ranges from 87-240, usually mid 100's. Walks on treadmill and in the park 3-4 times a week for 15 minutes. Eats cereal, eggs, bread, sausage. Green vegetables with lunch and dinner. Denies drinking juice. Drinks diet pepsi. Sober from EtOH for 6-8 months. Had an eye exam just over a year ago. He is due now. Doesn't remember the last time he went to a dentist. He had all his teeth pulled out.   Nausea and vomiting: since this morning. Emesis x2. Emesis is NBNB. He thinks this is due to his percocet. Reports similar symptoms in the past. Had runny nose and congestion from cold few days ago. Denies fever, chills, headache, sore throat, shortness of breath, chest pain, abdominal pain, blood in stool or dark stool.   Hypertension: on amlodipine 10 mg daily. He reports taking this daily. Doesn't check his blood pressure at home. He reports throwing up his amlodipine this morning. Exercise and diet as above.   PMH/Problem List: has Hyperlipidemia; Diabetic neuropathy ([place]); Essential hypertension; Coronary artery disease involving native heart without angina pectoris; Alcohol withdrawal ([place]); Type 2 diabetes mellitus ([place]); Depression; Generalized anxiety disorder; Chronic back pain; Secondary hyperparathyroidism of renal origin ([place]); Anemia of chronic disease; Abnormal ECG; Syncope and collapse; Chronic kidney disease, stage 4 (severe) ([place]); History of ETOH abuse; Routine adult health maintenance; Gout; Insomnia; Bradycardia; and Basal cell carcinoma on their problem list.   has a past medical  history of Anxiety, Depression, Diabetes mellitus without complication ([place]), Hypertension, and Peripheral neuropathy.  FH:  Family History  Problem Relation Age of Onset  . Diabetes Father   . Heart attack Brother   . Heart disease Brother   . Diabetes Mother   . Heart disease Mother   . Congestive Heart Failure Mother   . Heart disease Brother   . Heart attack Brother     [facility] Social History   Tobacco Use  . Smoking status: Never Smoker  . Smokeless tobacco: Never Used  Substance Use Topics  . Alcohol use: No  . Drug use: No    Review of Systems Review of systems negative except for pertinent positives and negatives in history of present illness above.     Objective:     Vitals:   03/12/17 1102 03/12/17 1211  BP: (!) 222/78 (!) 190/72  Pulse: (!) 54   Temp: (!) 97.5 F (36.4 C)   TempSrc: Oral   SpO2: 98%   Weight: 171 lb 9.6 oz (77.8 kg)   Height: '5\' 4"'  (1.626 m)    Body mass index is 29.46 kg/m.  Physical Exam  GEN: appears well, no apparent distress. Head: normocephalic and atraumatic  Eyes: conjunctiva without injection, sclera anicteric Nares: no rhinorrhea, congestion or erythema Oropharynx: mmm without erythema or exudation. No dentition HEM: negative for cervical or periauricular lymphadenopathies CVS: RRR, nl s1 & s2, no murmurs, no edema,  2+ DP & PT pulses bilaterally RESP: no IWOB, good air movement bilaterally, CTAB GI: limited exam due to body habitus but BS present & normal, soft, NTND GU: no suprapubic or  CVA tenderness MSK: no focal tenderness or notable swelling SKIN: no apparent skin lesion ENDO: negative thyromegally NEURO: alert and oiented appropriately, no gross deficits  PSYCH: euthymic mood with congruent affect  Diabetic Foot Exam: Inspection: no skin lesion, ulcer or callus. Toenails slightly long Vascular: DP & PT pulses 2+ bilaterally Neuro: Intact 9-point monofilament exam bilaterally    Assessment and Plan:  1. Type  2 diabetes mellitus with stage 4 chronic kidney disease, with long-term current use of insulin ([place]): fairly controlled. A1c stable at 7.7%. Foot exam normal except for some enlarged toe nails. Advised him to keep his toe nails short. Will continue insulin (Lantus and Novolog). Advised him to hold Victoza until nausea and emesis resolves. Encouraged him to see an eye doctor at least annually. Encouraged him to go to dentist unless told otherwise by a dentist. Discussed about diet and exercise. Gave him handout as well. Followed up in 6 months.   2. Hypertension/Hypertensive urgency: initial BP 222/78. He is on amlodipine 10 mg daily. Vomited his morning dose today. He is asymptomatic except for nausea and emesis. I suspect his nausea and emesis to be due to Victoza. It could also be due to uremia given his worsening renal function. Gave him clonidine 0.1 mg once in clinic. BP improved to 190/72. Added clonidine patch 0.1 mg to his regimen. Discussed return precautions and encouraged to seem immediate care if chest pain, shortness of breath, severe headache or focal neuro deficits. - cloNIDine (CATAPRES) tablet 0.1 mg - cloNIDine (CATAPRES - DOSED IN MG/24 HR) 0.1 mg/24hr patch; Place 1 patch (0.1 mg total) onto the skin once a week.  Dispense: 4 patch; Refill: 12 - CMP14+EGFR - Follow up in two weeks  3. Non-intractable vomiting with nausea, unspecified vomiting type: likely due to [place]. Could also be due to percocet or uremia from his worsening renal function. Advised him to stop the victoza until his feels well. Will check his BMP today.  4. Chronic low back pain with sciatica, sciatica laterality unspecified, unspecified back pain laterality: on percocet. He says percocet makes his pain manageable and enables him to be more active. Reports keeping his bottle safe. Denies fall. No other red flags for diversion, malignancy, infectious process or fracture. Neuro exam grossly intact. -  oxyCODONE-acetaminophen (ROXICET) 5-325 MG tablet; Take 1 tablet by mouth every 8 (eight) hours as needed for severe pain.  Dispense: 90 tablet; Refill: 0  Return in about 2 weeks (around 03/26/2017) for Hypertension.  Mercy Riding, MD 03/12/17 Pager: [phone]

## 2017-03-13 ENCOUNTER — Telehealth: Payer: Self-pay | Admitting: Student

## 2017-03-13 DIAGNOSIS — E875 Hyperkalemia: Secondary | ICD-10-CM

## 2017-03-13 LAB — CMP14+EGFR
A/G RATIO: 1.6 (ref 1.2–2.2)
ALBUMIN: 4.1 g/dL (ref 3.6–4.8)
ALT: 11 IU/L (ref 0–44)
AST: 12 IU/L (ref 0–40)
Alkaline Phosphatase: 67 IU/L (ref 39–117)
BUN / CREAT RATIO: 15 (ref 10–24)
BUN: 62 mg/dL — AB (ref 8–27)
CHLORIDE: 101 mmol/L (ref 96–106)
CO2: 18 mmol/L — ABNORMAL LOW (ref 20–29)
Calcium: 9.4 mg/dL (ref 8.6–10.2)
Creatinine, Ser: 4.27 mg/dL — ABNORMAL HIGH (ref 0.76–1.27)
GFR calc non Af Amer: 13 mL/min/{1.73_m2} — ABNORMAL LOW (ref >59)
GFR, EST AFRICAN AMERICAN: 15 mL/min/{1.73_m2} — AB (ref >59)
GLOBULIN, TOTAL: 2.6 g/dL (ref 1.5–4.5)
Glucose: 157 mg/dL — ABNORMAL HIGH (ref 65–99)
POTASSIUM: 5.5 mmol/L — AB (ref 3.5–5.2)
Sodium: 139 mmol/L (ref 134–144)
TOTAL PROTEIN: 6.7 g/dL (ref 6.0–8.5)

## 2017-03-13 MED ORDER — SODIUM POLYSTYRENE SULFONATE 15 GM/60ML PO SUSP: 30.0000 g | Freq: Once | ORAL | 0 refills | Status: AC

## 2017-03-13 NOTE — Telephone Encounter (Signed)
CMP significant for hyperkalemia to 5.5, serum creatinine 4.27 and bicarb 18.  I discussed this with his nephrologist at [facility] nephrology.  Per his nephrologist, he had hyperkalemia to 5.7 about a month ago.  He says we should not be worried about the potassium but can give him a one-time dose of Kayexalate 30 g.  He said the most important thing is patient taking his sodium bicarbonate.   I called patient and discussed about the lab results.  I sent a prescription for Kayexalate to [facility] in [place] per patient's request.  I also stressed about the importance of taking his sodium bicarbonate.  Advised him to seek immediate care or call 911 if he has any funny feeling in his chest or other symptoms concerning to him.  Patient has a follow-up in our clinic in 2 weeks.

## 2017-03-20 ENCOUNTER — Telehealth: Payer: Self-pay | Admitting: *Deleted

## 2017-03-20 NOTE — Telephone Encounter (Signed)
Faxed these today per Dr. Cyndia Skeeters. Katharina Caper, April D, [place]

## 2017-03-20 NOTE — Telephone Encounter (Signed)
-----   Message from Mercy Riding, MD sent at 03/13/2017 11:28 AM EST ----- Regarding: Lab result [place] Team, May someone fax his last blood test result (CMP) to his nephrologist at [phone]. Thank you! Bretta Bang

## 2017-03-27 ENCOUNTER — Ambulatory Visit: Payer: Medicare Other | Admitting: Student

## 2017-04-02 ENCOUNTER — Other Ambulatory Visit: Payer: Self-pay | Admitting: Student

## 2017-04-02 DIAGNOSIS — I10 Essential (primary) hypertension: Secondary | ICD-10-CM

## 2017-04-02 MED ORDER — HYDRALAZINE HCL 50 MG PO TABS: 50.0000 mg | ORAL_TABLET | Freq: Three times a day (TID) | ORAL | 1 refills | Status: DC

## 2017-04-10 ENCOUNTER — Ambulatory Visit (INDEPENDENT_AMBULATORY_CARE_PROVIDER_SITE_OTHER): Payer: Medicare Other | Admitting: Student

## 2017-04-10 ENCOUNTER — Encounter: Payer: Self-pay | Admitting: Student

## 2017-04-10 VITALS — BP 140/80 | HR 57 | Temp 97.8°F | Wt 173.6 lb

## 2017-04-10 DIAGNOSIS — M109 Gout, unspecified: Secondary | ICD-10-CM

## 2017-04-10 DIAGNOSIS — I10 Essential (primary) hypertension: Secondary | ICD-10-CM | POA: Diagnosis present

## 2017-04-10 DIAGNOSIS — Z794 Long term (current) use of insulin: Secondary | ICD-10-CM

## 2017-04-10 DIAGNOSIS — E1122 Type 2 diabetes mellitus with diabetic chronic kidney disease: Secondary | ICD-10-CM

## 2017-04-10 DIAGNOSIS — N184 Chronic kidney disease, stage 4 (severe): Secondary | ICD-10-CM | POA: Diagnosis not present

## 2017-04-10 MED ORDER — FEBUXOSTAT 40 MG PO TABS: 40.0000 mg | ORAL_TABLET | Freq: Every day | ORAL | 0 refills | Status: DC

## 2017-04-10 MED ORDER — NIFEDIPINE ER OSMOTIC RELEASE 60 MG PO TB24: 60.0000 mg | ORAL_TABLET | Freq: Every day | ORAL | 0 refills | Status: DC

## 2017-04-10 NOTE — Progress Notes (Signed)
Subjective:    Joseph Mcdonald is a 67 y.o. old male here for follow up on hypertension.  He is here with his daughter.  HPI Hypertension: Patient with history of poorly controlled hypertension. He was seen in clinic about a months ago.  At that time, he had hypertensive urgency and was started on clonidine patch.  However, his blood pressure continued to be high.  She was recently seen by his nephrologist who stopped his Norvasc and started him on hydralazine 50 mg 3 times daily and nifedipine 60 mg ER daily.  He has been on hydralazine for the last 1 week.  He started taking his nifedipine this morning.  He reports checking his blood pressure at home.  He says he has a wrist cuff.  He says his systolic blood pressure was in 160s-90s.  Reports walking on treadmill for 15 minutes 3 days a week.  He watch his salt intake.  Reports drinking a lot of diet soda. Denies dizziness, shortness of breath, chest pain or focal numbness, tingling or weakness.  PMH/Problem List: has Hyperlipidemia; Diabetic neuropathy ([place]); Essential hypertension; Coronary artery disease involving native heart without angina pectoris; Alcohol withdrawal ([place]); Type 2 diabetes mellitus ([place]); Depression; Generalized anxiety disorder; Chronic back pain; Secondary hyperparathyroidism of renal origin ([place]); Anemia of chronic disease; Abnormal ECG; Syncope and collapse; Chronic kidney disease, stage 4 (severe) ([place]); History of ETOH abuse; Routine adult health maintenance; Gout; Insomnia; Bradycardia; and Basal cell carcinoma on their problem list.   has a past medical history of Anxiety, Depression, Diabetes mellitus without complication ([place]), Hypertension, and Peripheral neuropathy.  FH:  Family History  Problem Relation Age of Onset  . Diabetes Father   . Heart attack Brother   . Heart disease Brother   . Diabetes Mother   . Heart disease Mother   . Congestive Heart Failure Mother   . Heart disease Brother   . Heart attack Brother      [facility] Social History   Tobacco Use  . Smoking status: Never Smoker  . Smokeless tobacco: Never Used  Substance Use Topics  . Alcohol use: No  . Drug use: No    Review of Systems Review of systems negative except for pertinent positives and negatives in history of present illness above.     Objective:     Vitals:   04/10/17 1116  BP: 140/80  Pulse: (!) 57  Temp: 97.8 F (36.6 C)  TempSrc: Oral  SpO2: 97%  Weight: 173 lb 9.6 oz (78.7 kg)   Body mass index is 29.8 kg/m.  Physical Exam  GEN: appears well, no apparent distress. CVS: 60/min, RR, nl s1 & s2, no murmurs, trace edema RESP: no IWOB, good air movement bilaterally, CTAB GI: BS present & normal, soft, NTND MSK: no focal tenderness or notable swelling SKIN: no apparent skin lesion NEURO: alert and oiented appropriately, no gross deficits  PSYCH: euthymic mood with congruent affect    Assessment and Plan:  1. Essential hypertension: Blood pressure 140/80.  We will continue clonidine patch weekly, hydralazine 50 mg 3 times daily and nifedipine 60 mg ER daily.  He just started nifedipine this morning.  Advised him to let us know if he has dizziness or other symptoms concerning to him.  Recommended checking his blood pressure at home.  Given a prescription for blood pressure cuff.  Discussed about lifestyle change including diet and exercise.  Gave him handout as well.  Follow-up in 3 months.  2. Type 2 diabetes mellitus  with stage 4 chronic kidney disease, with long-term current use of insulin [facility]): he has no further nausea, emesis or abdominal pain.  I recommended resuming his Victoza at 0.6 mg daily.  He may go up to 1.2 mg after 1 week if he has no symptoms.  He is interested in wearable glucose monitor.  He says [place] [facility] will fax over the request form. I will sign when I receive those forms.  Follow-up in 3 months.  Return in about 3 months (around 07/09/2017) for Hypertension and diabetes.  Mercy Riding,  MD 04/10/17 Pager: [phone]

## 2017-04-10 NOTE — Patient Instructions (Addendum)
It was great seeing you today! We have addressed the following issues today  Blood pressure: Your blood pressure is 140/80 today.  Continue taking your medications.  Check your blood pressure once a day as we discussed in the office.  Get a new blood pressure cuff and check your blood pressure over your upper arm as we discussed in the office.  Please keep your blood pressure log and take to your next appointment.   Diabetes: You may restart your Victoza at 0.6 mg daily.  You can increase to 1.2 mg daily if you have not nausea, vomiting or belly pain.    If we did any lab work today, and the results require attention, either me or my nurse will get in touch with you. If everything is normal, you will get a letter in mail and a message via . If you don't hear from [place] in two weeks, please give [place] a call. Otherwise, we look forward to seeing you again at your next visit. If you have any questions or concerns before then, please call the clinic at [phone].  Please bring all your medications to every doctors visit  Sign up for My Chart to have easy access to your labs results, and communication with your Primary care physician.    Please check-out at the front desk before leaving the clinic.    Take Care,   Dr. Cyndia Skeeters  Portion Size   Choose healthier foods such as 100% whole grains, vegetables, fruits, beans, nut seeds, olive oil, most vegetable oils, fat-free dietary, wild game and fish.   Avoid sweet tea, other sweetened beverages, soda, fruit juice, cold cereal and milk and trans fat.   Eat at least 3 meals and 1-2 snacks per day.  Aim for no more than 5 hours between eating.  Eat breakfast within one hour of getting up.    Exercise at least 150 minutes per week, including weight resistance exercises 3 or 4 times per week.   Try to lose at least 7-10% of your current body weight.   Limit your salt (Sodium) intake to less than 2 gm (2000 mg) a day if you have conditions such  as  elevated blood pressure, heart failure...    You may also read about DASH and/or Mediterranean diet at the following web site if you have blood pressure or heart condition. IdentityList.se LACodes.co.za

## 2017-04-11 ENCOUNTER — Encounter: Payer: Self-pay | Admitting: Student

## 2017-04-22 ENCOUNTER — Telehealth: Payer: Self-pay | Admitting: Student

## 2017-04-22 DIAGNOSIS — G8929 Other chronic pain: Secondary | ICD-10-CM

## 2017-04-22 DIAGNOSIS — M5440 Lumbago with sciatica, unspecified side: Principal | ICD-10-CM

## 2017-04-22 MED ORDER — OXYCODONE-ACETAMINOPHEN 5-325 MG PO TABS: 1.0000 | ORAL_TABLET | Freq: Three times a day (TID) | ORAL | 0 refills | Status: DC | PRN

## 2017-04-22 NOTE — Telephone Encounter (Signed)
Pt is calling in for a refill on his Oxycodone. It is due to refill on Friday or Saturday this week. He is calling in 72 hour timeframe so that he he doesn't call at the last minute. [facility] also [facility] [place]

## 2017-04-25 ENCOUNTER — Encounter: Payer: Self-pay | Admitting: Student

## 2017-05-15 ENCOUNTER — Encounter: Payer: Self-pay | Admitting: Student

## 2017-05-21 ENCOUNTER — Other Ambulatory Visit: Payer: Self-pay | Admitting: Student

## 2017-05-21 DIAGNOSIS — M5440 Lumbago with sciatica, unspecified side: Principal | ICD-10-CM

## 2017-05-21 DIAGNOSIS — G8929 Other chronic pain: Secondary | ICD-10-CM

## 2017-05-21 NOTE — Telephone Encounter (Signed)
Patient refill for [facility] at [facility] [place] [place] cannot be approved via faxed request.  Please call pharmacy to see what they need in order to get medication refilled.

## 2017-05-24 MED ORDER — OXYCODONE-ACETAMINOPHEN 5-325 MG PO TABS: 1.0000 | ORAL_TABLET | Freq: Three times a day (TID) | ORAL | 0 refills | Status: DC | PRN

## 2017-06-03 ENCOUNTER — Telehealth: Payer: Self-pay

## 2017-06-03 DIAGNOSIS — N184 Chronic kidney disease, stage 4 (severe): Principal | ICD-10-CM

## 2017-06-03 DIAGNOSIS — E1122 Type 2 diabetes mellitus with diabetic chronic kidney disease: Secondary | ICD-10-CM

## 2017-06-03 NOTE — Telephone Encounter (Signed)
Pt's daughter called requesting refill of test strips be sent to "[facility]". Unable to find pharmacy in system. Attempted to call daughter back- no answer, no voicemail. Will attempt again later. Wallace Cullens, RN

## 2017-06-04 NOTE — Telephone Encounter (Signed)
Please fax RX for test strips  to [facility]  Fax [phone]

## 2017-06-10 ENCOUNTER — Encounter: Payer: Self-pay | Admitting: Student

## 2017-06-10 MED ORDER — GLUCOSE BLOOD VI STRP: ORAL_STRIP | 12 refills | Status: DC

## 2017-06-10 NOTE — Telephone Encounter (Signed)
Faxed Rx for test strip to [phone]

## 2017-06-12 NOTE — Telephone Encounter (Signed)
Opened in error. Joseph Mcdonald, CMA 

## 2017-06-24 ENCOUNTER — Other Ambulatory Visit: Payer: Self-pay

## 2017-06-24 DIAGNOSIS — G8929 Other chronic pain: Secondary | ICD-10-CM

## 2017-06-24 DIAGNOSIS — M5440 Lumbago with sciatica, unspecified side: Principal | ICD-10-CM

## 2017-06-24 NOTE — Telephone Encounter (Signed)
Left message on nurse line requesting refill of oxycodone.  Call back [phone] Wallace Cullens, RN

## 2017-06-25 MED ORDER — OXYCODONE-ACETAMINOPHEN 5-325 MG PO TABS: 1.0000 | ORAL_TABLET | Freq: Three times a day (TID) | ORAL | 0 refills | Status: DC | PRN

## 2017-06-25 NOTE — Telephone Encounter (Signed)
Pt wife is calling to check on the status of their refill request from yesterday. They would like to be called when its ready to be picked up.

## 2017-06-25 NOTE — Telephone Encounter (Signed)
Pt informed[facility] [place] [place]. Ottis Stain, CMA

## 2017-07-03 ENCOUNTER — Telehealth: Payer: Self-pay | Admitting: Student

## 2017-07-03 NOTE — Telephone Encounter (Signed)
Attempted to call patient to discuss about his gout medication (Febuxostat) given recent FDA warning of increased cardiac and overall mortality with this medication. He was started on this medication by his nephrologist. Realized that patient has an upcoming appointment with me in 5 days. Will discuss this at that time.

## 2017-07-07 ENCOUNTER — Encounter: Payer: Self-pay | Admitting: Student

## 2017-07-08 ENCOUNTER — Ambulatory Visit (INDEPENDENT_AMBULATORY_CARE_PROVIDER_SITE_OTHER): Payer: Medicare Other | Admitting: Student

## 2017-07-08 ENCOUNTER — Encounter: Payer: Self-pay | Admitting: Student

## 2017-07-08 ENCOUNTER — Other Ambulatory Visit: Payer: Self-pay

## 2017-07-08 DIAGNOSIS — N184 Chronic kidney disease, stage 4 (severe): Secondary | ICD-10-CM

## 2017-07-08 DIAGNOSIS — E1121 Type 2 diabetes mellitus with diabetic nephropathy: Secondary | ICD-10-CM

## 2017-07-08 DIAGNOSIS — E1122 Type 2 diabetes mellitus with diabetic chronic kidney disease: Secondary | ICD-10-CM | POA: Insufficient documentation

## 2017-07-08 DIAGNOSIS — Z794 Long term (current) use of insulin: Secondary | ICD-10-CM

## 2017-07-08 LAB — POCT GLYCOSYLATED HEMOGLOBIN (HGB A1C): HEMOGLOBIN A1C: 7.2

## 2017-07-08 MED ORDER — ALLOPURINOL 100 MG PO TABS: 100.0000 mg | ORAL_TABLET | Freq: Every day | ORAL | 6 refills | Status: DC

## 2017-07-08 MED ORDER — INSULIN GLARGINE 100 UNIT/ML SOLOSTAR PEN: 20.0000 [IU] | PEN_INJECTOR | Freq: Every morning | SUBCUTANEOUS | 11 refills | Status: DC

## 2017-07-08 MED ORDER — LIRAGLUTIDE 18 MG/3ML ~~LOC~~ SOPN: PEN_INJECTOR | SUBCUTANEOUS | 3 refills | Status: AC

## 2017-07-08 NOTE — Patient Instructions (Signed)
It was great seeing you today! We have addressed the following issues today Diabetes: your A1c is 7.2% today. It was 7.7%. Your goal A1c is less than 8.0%. See below for more information about A1c.  1. Check your blood glucose before breakfast and dinner 2. Inject 20 units of Lantus in the morning 3. Stop NovoLog 4. You may increase your Victoza to 1.2 mg. If your blood glucose stays over 140, you can increase it further to 1.8 mg as long as you don't have nausea. 5. Eat regular meals (breakfast, lunch and dinner) around-the-clock. You may snack as needed. See below about few tips and recommendations on diet and exercise.  6. Watch for symptoms of low blood sugar (hypoglycemia). Read below about these symptoms and management. 7. Please come back and see [place] in 3 months 8. Please bring all your medication bottles to that visit.   What is A1c:  The A1C test result reflects your average blood sugar level for the past two to three months. Specifically, the A1C test measures what percentage of your hemoglobin - a protein in red blood cells that carries oxygen - is coated with sugar (glycated). The higher your A1C level, the poorer your blood sugar control and the higher your risk of diabetes complications. Portion Size    Choose healthier foods such as 100% whole grains, vegetables, fruits, beans, nut seeds, olive oil, most vegetable oils, fat-free dietary, wild game and fish.   Avoid sweet tea, other sweetened beverages, soda, fruit juice, cold cereal and milk and trans fat.   Eat at least 3 meals and 1-2 snacks per day.  Aim for no more than 5 hours between eating.  Eat breakfast within one hour of getting up.    Exercise at least 150 minutes per week, including weight resistance exercises 3 or 4 times per week.   Try to lose at least 7-10% of your current body weight.   Hypoglycemia Hypoglycemia is when the sugar (glucose) level in the blood is too low. Symptoms of low blood sugar may  include:  Feeling: ? Hungry. ? Worried or nervous (anxious). ? Sweaty and clammy. ? Confused. ? Dizzy. ? Sleepy. ? Sick to your stomach (nauseous).  Having: ? A fast heartbeat. ? A headache. ? A change in your vision. ? Jerky movements that you cannot control (seizure). ? Nightmares. ? Tingling or no feeling (numbness) around the mouth, lips, or tongue.  Having trouble with: ? Talking. ? Paying attention (concentrating). ? Moving (coordination). ? Sleeping.  Shaking.  Passing out (fainting).  Getting upset easily (irritability).  Low blood sugar can happen to people who have diabetes and people who do not have diabetes. Low blood sugar can happen quickly, and it can be an emergency. Treating Low Blood Sugar Low blood sugar is often treated by eating or drinking something sugary right away. If you can think clearly and swallow safely, follow the 15:15 rule:  Take 15 grams of a fast-acting carb (carbohydrate). Some fast-acting carbs are: ? 1 tube of glucose gel. ? 3 sugar tablets (glucose pills). ? 6-8 pieces of hard candy. ? 4 oz (120 mL) of fruit juice. ? 4 oz (120 mL) of regular (not diet) soda.  Check your blood sugar 15 minutes after you take the carb.  If your blood sugar is still at or below 70 mg/dL (3.9 mmol/L), take 15 grams of a carb again.  If your blood sugar does not go above 70 mg/dL (3.9 mmol/L) after 3 tries,  get help right away.  After your blood sugar goes back to normal, eat a meal or a snack within 1 hour.  Treating Very Low Blood Sugar If your blood sugar is at or below 54 mg/dL (3 mmol/L), you have very low blood sugar (severe hypoglycemia). This is an emergency. Do not wait to see if the symptoms will go away. Get medical help right away. Call your local emergency services (911 in the [place] Do not drive yourself to the hospital. If you have very low blood sugar and you cannot eat or drink, you may need a glucagon shot (injection). A family  member or friend should learn how to check your blood sugar and how to give you a glucagon shot. Ask your doctor if you need to have a glucagon shot kit at home. Follow these instructions at home: General instructions  Avoid any diets that cause you to not eat enough food. Talk with your doctor before you start any new diet.  Take over-the-counter and prescription medicines only as told by your doctor.  Limit alcohol to no more than 1 drink per day for nonpregnant women and 2 drinks per day for men. One drink equals 12 oz of beer, 5 oz of wine, or 1 oz of hard liquor.  Keep all follow-up visits as told by your doctor. This is important. If You Have Diabetes:   Make sure you know the symptoms of low blood sugar.  Always keep a source of sugar with you, such as: ? Sugar. ? Sugar tablets. ? Glucose gel. ? Fruit juice. ? Regular soda (not diet soda). ? Milk. ? Hard candy. ? Honey.  Take your medicines as told.  Follow your exercise and meal plan. ? Eat on time. Do not skip meals. ? Follow your sick day plan when you cannot eat or drink normally. Make this plan ahead of time with your doctor.  Check your blood sugar as often as told by your doctor. Always check before and after exercise.  Share your diabetes care plan with: ? Your work or school. ? People you live with.  Check your pee (urine) for ketones: ? When you are sick. ? As told by your doctor.  Carry a card or wear jewelry that says you have diabetes. If You Have Low Blood Sugar From Other Causes:   Check your blood sugar as often as told by your doctor.  Follow instructions from your doctor about what you cannot eat or drink. Contact a doctor if:  You have trouble keeping your blood sugar in your target range.  You have low blood sugar often. Get help right away if:  You still have symptoms after you eat or drink something sugary.  Your blood sugar is at or below 54 mg/dL (3 mmol/L).  You have jerky  movements that you cannot control.  You pass out. These symptoms may be an emergency. Do not wait to see if the symptoms will go away. Get medical help right away. Call your local emergency services (911 in the [place] Do not drive yourself to the hospital. This information is not intended to replace advice given to you by your health care provider. Make sure you discuss any questions you have with your health care provider. Document Released: 06/05/2009 Document Revised: 08/17/2015 Document Reviewed: 04/14/2015 Elsevier Interactive Patient Education  [employer].
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
## 2017-07-14 ENCOUNTER — Encounter: Payer: Self-pay | Admitting: Student

## 2017-07-14 ENCOUNTER — Other Ambulatory Visit: Payer: Self-pay | Admitting: Student

## 2017-07-14 DIAGNOSIS — E1122 Type 2 diabetes mellitus with diabetic chronic kidney disease: Secondary | ICD-10-CM

## 2017-07-14 DIAGNOSIS — Z794 Long term (current) use of insulin: Principal | ICD-10-CM

## 2017-07-14 DIAGNOSIS — N184 Chronic kidney disease, stage 4 (severe): Principal | ICD-10-CM

## 2017-07-14 MED ORDER — BASAGLAR KWIKPEN 100 UNIT/ML ~~LOC~~ SOPN: 20.0000 [IU] | PEN_INJECTOR | Freq: Every morning | SUBCUTANEOUS | 3 refills | Status: DC

## 2017-07-14 NOTE — Progress Notes (Signed)
Changed Lantus to Basaglar due to insurance.

## 2017-07-15 ENCOUNTER — Other Ambulatory Visit: Payer: Self-pay | Admitting: *Deleted

## 2017-07-15 DIAGNOSIS — F411 Generalized anxiety disorder: Secondary | ICD-10-CM

## 2017-07-15 MED ORDER — ESCITALOPRAM OXALATE 10 MG PO TABS: 10.0000 mg | ORAL_TABLET | Freq: Every day | ORAL | 3 refills | Status: DC

## 2017-07-15 NOTE — Telephone Encounter (Signed)
Request came in from [employer] but contacted pt and he would like [facility] [place].

## 2017-07-17 ENCOUNTER — Encounter: Payer: Self-pay | Admitting: Student

## 2017-07-22 ENCOUNTER — Telehealth: Payer: Self-pay

## 2017-07-22 NOTE — Telephone Encounter (Signed)
Please advise him to call clinic for office visit/SDA.  Thank you, Bretta Bang

## 2017-07-22 NOTE — Telephone Encounter (Signed)
Spoke to [place]. She can't get pt into office for a couple of days. She is wondering if pt needs to go back on his fluid pill?  She will call back and schedule an appt as soon as she can line up a ride to our office for the pt. Ottis Stain, CMA

## 2017-07-22 NOTE — Telephone Encounter (Signed)
Pts daughter Lattie Haw calling regarding patient. States since his last OV he has had increased swelling in his hands, and would like Dr. Cyndia Skeeters to know and advise. Lisa's call back [phone] Wallace Cullens, RN

## 2017-07-24 ENCOUNTER — Other Ambulatory Visit: Payer: Self-pay

## 2017-07-24 DIAGNOSIS — G8929 Other chronic pain: Secondary | ICD-10-CM

## 2017-07-24 DIAGNOSIS — M5440 Lumbago with sciatica, unspecified side: Principal | ICD-10-CM

## 2017-07-24 MED ORDER — OXYCODONE-ACETAMINOPHEN 5-325 MG PO TABS: 1.0000 | ORAL_TABLET | Freq: Three times a day (TID) | ORAL | 0 refills | Status: DC | PRN

## 2017-07-30 ENCOUNTER — Emergency Department (HOSPITAL_COMMUNITY): Payer: Medicare Other

## 2017-07-30 ENCOUNTER — Inpatient Hospital Stay (HOSPITAL_COMMUNITY)
Admission: EM | Admit: 2017-07-30 | Discharge: 2017-08-09 | DRG: 673 | Disposition: A | Discharge status: Home Health | Payer: Medicare Other | Attending: Family Medicine | Admitting: Family Medicine

## 2017-07-30 ENCOUNTER — Other Ambulatory Visit: Payer: Self-pay

## 2017-07-30 ENCOUNTER — Inpatient Hospital Stay (HOSPITAL_COMMUNITY): Payer: Medicare Other

## 2017-07-30 ENCOUNTER — Encounter (HOSPITAL_COMMUNITY): Payer: Self-pay

## 2017-07-30 DIAGNOSIS — N184 Chronic kidney disease, stage 4 (severe): Secondary | ICD-10-CM | POA: Diagnosis not present

## 2017-07-30 DIAGNOSIS — I5023 Acute on chronic systolic (congestive) heart failure: Secondary | ICD-10-CM | POA: Diagnosis not present

## 2017-07-30 DIAGNOSIS — I252 Old myocardial infarction: Secondary | ICD-10-CM

## 2017-07-30 DIAGNOSIS — E875 Hyperkalemia: Secondary | ICD-10-CM | POA: Diagnosis present

## 2017-07-30 DIAGNOSIS — Z992 Dependence on renal dialysis: Secondary | ICD-10-CM

## 2017-07-30 DIAGNOSIS — I132 Hypertensive heart and chronic kidney disease with heart failure and with stage 5 chronic kidney disease, or end stage renal disease: Secondary | ICD-10-CM | POA: Diagnosis present

## 2017-07-30 DIAGNOSIS — N185 Chronic kidney disease, stage 5: Secondary | ICD-10-CM | POA: Diagnosis not present

## 2017-07-30 DIAGNOSIS — I2510 Atherosclerotic heart disease of native coronary artery without angina pectoris: Secondary | ICD-10-CM | POA: Diagnosis not present

## 2017-07-30 DIAGNOSIS — Z7982 Long term (current) use of aspirin: Secondary | ICD-10-CM

## 2017-07-30 DIAGNOSIS — E1140 Type 2 diabetes mellitus with diabetic neuropathy, unspecified: Secondary | ICD-10-CM | POA: Diagnosis present

## 2017-07-30 DIAGNOSIS — R740 Nonspecific elevation of levels of transaminase and lactic acid dehydrogenase [LDH]: Secondary | ICD-10-CM

## 2017-07-30 DIAGNOSIS — N179 Acute kidney failure, unspecified: Principal | ICD-10-CM

## 2017-07-30 DIAGNOSIS — D631 Anemia in chronic kidney disease: Secondary | ICD-10-CM | POA: Diagnosis present

## 2017-07-30 DIAGNOSIS — G8929 Other chronic pain: Secondary | ICD-10-CM | POA: Diagnosis present

## 2017-07-30 DIAGNOSIS — Z794 Long term (current) use of insulin: Secondary | ICD-10-CM

## 2017-07-30 DIAGNOSIS — R7989 Other specified abnormal findings of blood chemistry: Secondary | ICD-10-CM

## 2017-07-30 DIAGNOSIS — E1121 Type 2 diabetes mellitus with diabetic nephropathy: Secondary | ICD-10-CM | POA: Diagnosis present

## 2017-07-30 DIAGNOSIS — D696 Thrombocytopenia, unspecified: Secondary | ICD-10-CM | POA: Diagnosis not present

## 2017-07-30 DIAGNOSIS — N2581 Secondary hyperparathyroidism of renal origin: Secondary | ICD-10-CM | POA: Diagnosis present

## 2017-07-30 DIAGNOSIS — I25810 Atherosclerosis of coronary artery bypass graft(s) without angina pectoris: Secondary | ICD-10-CM | POA: Diagnosis not present

## 2017-07-30 DIAGNOSIS — I452 Bifascicular block: Secondary | ICD-10-CM | POA: Diagnosis not present

## 2017-07-30 DIAGNOSIS — K760 Fatty (change of) liver, not elsewhere classified: Secondary | ICD-10-CM | POA: Diagnosis not present

## 2017-07-30 DIAGNOSIS — I361 Nonrheumatic tricuspid (valve) insufficiency: Secondary | ICD-10-CM | POA: Diagnosis not present

## 2017-07-30 DIAGNOSIS — R748 Abnormal levels of other serum enzymes: Secondary | ICD-10-CM | POA: Diagnosis present

## 2017-07-30 DIAGNOSIS — Z880 Allergy status to penicillin: Secondary | ICD-10-CM

## 2017-07-30 DIAGNOSIS — N401 Enlarged prostate with lower urinary tract symptoms: Secondary | ICD-10-CM | POA: Diagnosis not present

## 2017-07-30 DIAGNOSIS — Z419 Encounter for procedure for purposes other than remedying health state, unspecified: Secondary | ICD-10-CM

## 2017-07-30 DIAGNOSIS — E1122 Type 2 diabetes mellitus with diabetic chronic kidney disease: Secondary | ICD-10-CM | POA: Diagnosis not present

## 2017-07-30 DIAGNOSIS — I214 Non-ST elevation (NSTEMI) myocardial infarction: Secondary | ICD-10-CM | POA: Diagnosis not present

## 2017-07-30 DIAGNOSIS — Z85828 Personal history of other malignant neoplasm of skin: Secondary | ICD-10-CM

## 2017-07-30 DIAGNOSIS — Z833 Family history of diabetes mellitus: Secondary | ICD-10-CM

## 2017-07-30 DIAGNOSIS — E1165 Type 2 diabetes mellitus with hyperglycemia: Secondary | ICD-10-CM | POA: Diagnosis not present

## 2017-07-30 DIAGNOSIS — I5021 Acute systolic (congestive) heart failure: Secondary | ICD-10-CM

## 2017-07-30 DIAGNOSIS — I440 Atrioventricular block, first degree: Secondary | ICD-10-CM | POA: Diagnosis not present

## 2017-07-30 DIAGNOSIS — I272 Pulmonary hypertension, unspecified: Secondary | ICD-10-CM | POA: Diagnosis present

## 2017-07-30 DIAGNOSIS — R338 Other retention of urine: Secondary | ICD-10-CM | POA: Diagnosis not present

## 2017-07-30 DIAGNOSIS — Z951 Presence of aortocoronary bypass graft: Secondary | ICD-10-CM

## 2017-07-30 DIAGNOSIS — N186 End stage renal disease: Secondary | ICD-10-CM

## 2017-07-30 DIAGNOSIS — E1322 Other specified diabetes mellitus with diabetic chronic kidney disease: Secondary | ICD-10-CM | POA: Diagnosis present

## 2017-07-30 DIAGNOSIS — E871 Hypo-osmolality and hyponatremia: Secondary | ICD-10-CM | POA: Diagnosis present

## 2017-07-30 DIAGNOSIS — E119 Type 2 diabetes mellitus without complications: Secondary | ICD-10-CM | POA: Diagnosis not present

## 2017-07-30 DIAGNOSIS — L8990 Pressure ulcer of unspecified site, unspecified stage: Secondary | ICD-10-CM

## 2017-07-30 DIAGNOSIS — E872 Acidosis: Secondary | ICD-10-CM | POA: Diagnosis not present

## 2017-07-30 DIAGNOSIS — Z01810 Encounter for preprocedural cardiovascular examination: Secondary | ICD-10-CM | POA: Diagnosis not present

## 2017-07-30 DIAGNOSIS — R7401 Elevation of levels of liver transaminase levels: Secondary | ICD-10-CM

## 2017-07-30 DIAGNOSIS — IMO0002 Reserved for concepts with insufficient information to code with codable children: Secondary | ICD-10-CM | POA: Diagnosis present

## 2017-07-30 DIAGNOSIS — R001 Bradycardia, unspecified: Secondary | ICD-10-CM | POA: Diagnosis present

## 2017-07-30 DIAGNOSIS — I5043 Acute on chronic combined systolic (congestive) and diastolic (congestive) heart failure: Secondary | ICD-10-CM | POA: Diagnosis present

## 2017-07-30 DIAGNOSIS — E1365 Other specified diabetes mellitus with hyperglycemia: Secondary | ICD-10-CM

## 2017-07-30 DIAGNOSIS — K219 Gastro-esophageal reflux disease without esophagitis: Secondary | ICD-10-CM | POA: Diagnosis present

## 2017-07-30 DIAGNOSIS — R945 Abnormal results of liver function studies: Secondary | ICD-10-CM

## 2017-07-30 DIAGNOSIS — Z8249 Family history of ischemic heart disease and other diseases of the circulatory system: Secondary | ICD-10-CM

## 2017-07-30 DIAGNOSIS — I509 Heart failure, unspecified: Secondary | ICD-10-CM

## 2017-07-30 LAB — URINALYSIS, ROUTINE W REFLEX MICROSCOPIC
BILIRUBIN URINE: NEGATIVE
Bacteria, UA: NONE SEEN
GLUCOSE, UA: 150 mg/dL — AB
Ketones, ur: NEGATIVE mg/dL
Leukocytes, UA: NEGATIVE
NITRITE: NEGATIVE
Protein, ur: >=300 mg/dL — AB
SPECIFIC GRAVITY, URINE: 1.013 (ref 1.005–1.030)
pH: 5 (ref 5.0–8.0)

## 2017-07-30 LAB — CBC WITH DIFFERENTIAL/PLATELET
BLASTS: 0 %
Band Neutrophils: 0 %
Basophils Absolute: 0 10*3/uL (ref 0.0–0.1)
Basophils Relative: 0 %
Eosinophils Absolute: 0 10*3/uL (ref 0.0–0.7)
Eosinophils Relative: 0 %
HCT: 22.4 % — ABNORMAL LOW (ref 39.0–52.0)
HEMOGLOBIN: 7.6 g/dL — AB (ref 13.0–17.0)
LYMPHS PCT: 6 %
Lymphs Abs: 0.9 10*3/uL (ref 0.7–4.0)
MCH: 30.2 pg (ref 26.0–34.0)
MCHC: 33.9 g/dL (ref 30.0–36.0)
MCV: 88.9 fL (ref 78.0–100.0)
MONO ABS: 1.4 10*3/uL — AB (ref 0.1–1.0)
MYELOCYTES: 0 %
Metamyelocytes Relative: 0 %
Monocytes Relative: 10 %
NEUTROS PCT: 84 %
NRBC: 0 /100{WBCs}
Neutro Abs: 12.1 10*3/uL — ABNORMAL HIGH (ref 1.7–7.7)
Other: 0 %
PLATELETS: 77 10*3/uL — AB (ref 150–400)
PROMYELOCYTES RELATIVE: 0 %
RBC: 2.52 MIL/uL — AB (ref 4.22–5.81)
RDW: 13.8 % (ref 11.5–15.5)
WBC: 14.4 10*3/uL — AB (ref 4.0–10.5)

## 2017-07-30 LAB — COMPREHENSIVE METABOLIC PANEL
ALK PHOS: 94 U/L (ref 38–126)
ALT: 2996 U/L — AB (ref 17–63)
ANION GAP: 22 — AB (ref 5–15)
AST: 1408 U/L — ABNORMAL HIGH (ref 15–41)
Albumin: 3.4 g/dL — ABNORMAL LOW (ref 3.5–5.0)
BUN: 140 mg/dL — ABNORMAL HIGH (ref 6–20)
CALCIUM: 7 mg/dL — AB (ref 8.9–10.3)
CO2: 17 mmol/L — ABNORMAL LOW (ref 22–32)
CREATININE: 9.65 mg/dL — AB (ref 0.61–1.24)
Chloride: 91 mmol/L — ABNORMAL LOW (ref 101–111)
GFR, EST AFRICAN AMERICAN: 6 mL/min — AB (ref >60)
GFR, EST NON AFRICAN AMERICAN: 5 mL/min — AB (ref >60)
Glucose, Bld: 188 mg/dL — ABNORMAL HIGH (ref 65–99)
Potassium: 6.7 mmol/L (ref 3.5–5.1)
Sodium: 130 mmol/L — ABNORMAL LOW (ref 135–145)
TOTAL PROTEIN: 6.4 g/dL — AB (ref 6.5–8.1)
Total Bilirubin: 1.2 mg/dL (ref 0.3–1.2)

## 2017-07-30 LAB — RENAL FUNCTION PANEL
Albumin: 3.4 g/dL — ABNORMAL LOW (ref 3.5–5.0)
Anion gap: 22 — ABNORMAL HIGH (ref 5–15)
BUN: 146 mg/dL — AB (ref 6–20)
CO2: 20 mmol/L — ABNORMAL LOW (ref 22–32)
CREATININE: 9.73 mg/dL — AB (ref 0.61–1.24)
Calcium: 7.2 mg/dL — ABNORMAL LOW (ref 8.9–10.3)
Chloride: 91 mmol/L — ABNORMAL LOW (ref 101–111)
GFR calc Af Amer: 6 mL/min — ABNORMAL LOW (ref >60)
GFR, EST NON AFRICAN AMERICAN: 5 mL/min — AB (ref >60)
GLUCOSE: 228 mg/dL — AB (ref 65–99)
Phosphorus: 11.9 mg/dL — ABNORMAL HIGH (ref 2.5–4.6)
Potassium: 4.9 mmol/L (ref 3.5–5.1)
Sodium: 133 mmol/L — ABNORMAL LOW (ref 135–145)

## 2017-07-30 LAB — ETHANOL: Alcohol, Ethyl (B): <10 mg/dL (ref <10)

## 2017-07-30 LAB — BRAIN NATRIURETIC PEPTIDE: B NATRIURETIC PEPTIDE 5: 4146.3 pg/mL — AB (ref 0.0–100.0)

## 2017-07-30 LAB — PROTIME-INR
INR: 1.71
Prothrombin Time: 19.9 seconds — ABNORMAL HIGH (ref 11.4–15.2)

## 2017-07-30 LAB — IRON AND TIBC
Iron: 230 ug/dL — ABNORMAL HIGH (ref 45–182)
SATURATION RATIOS: 97 % — AB (ref 17.9–39.5)
TIBC: 238 ug/dL — ABNORMAL LOW (ref 250–450)
UIBC: 8 ug/dL

## 2017-07-30 LAB — TROPONIN I
TROPONIN I: 14.02 ng/mL — AB (ref <0.03)
TROPONIN I: 14.06 ng/mL — AB (ref <0.03)

## 2017-07-30 LAB — FERRITIN: Ferritin: >7500 ng/mL — ABNORMAL HIGH (ref 24–336)

## 2017-07-30 LAB — PREPARE RBC (CROSSMATCH)

## 2017-07-30 LAB — AMMONIA: Ammonia: 35 umol/L (ref 9–35)

## 2017-07-30 LAB — CBG MONITORING, ED: Glucose-Capillary: 266 mg/dL — ABNORMAL HIGH (ref 65–99)

## 2017-07-30 LAB — RETICULOCYTES
RBC.: 2.44 MIL/uL — AB (ref 4.22–5.81)
RETIC CT PCT: 2.4 % (ref 0.4–3.1)
Retic Count, Absolute: 58.6 10*3/uL (ref 19.0–186.0)

## 2017-07-30 LAB — ACETAMINOPHEN LEVEL

## 2017-07-30 LAB — FOLATE: Folate: 11.7 ng/mL (ref >5.9)

## 2017-07-30 LAB — PROCALCITONIN: Procalcitonin: 2.21 ng/mL

## 2017-07-30 LAB — CREATININE, URINE, RANDOM: CREATININE, URINE: 119.43 mg/dL

## 2017-07-30 LAB — VITAMIN B12: Vitamin B-12: 4551 pg/mL — ABNORMAL HIGH (ref 180–914)

## 2017-07-30 MED ORDER — INSULIN ASPART 100 UNIT/ML IV SOLN
10.0000 [IU] | Freq: Once | INTRAVENOUS | Status: AC
  Administered 2017-07-30: 10 [IU] via INTRAVENOUS
  Filled 2017-07-30: qty 0.1

## 2017-07-30 MED ORDER — CALCIUM ACETATE (PHOS BINDER) 667 MG PO CAPS
2001.0000 mg | ORAL_CAPSULE | Freq: Three times a day (TID) | ORAL | Status: DC
  Administered 2017-08-01 – 2017-08-09 (×19): 2001 mg via ORAL
  Filled 2017-07-30 (×20): qty 3

## 2017-07-30 MED ORDER — SODIUM BICARBONATE 8.4 % IV SOLN
50.0000 meq | Freq: Once | INTRAVENOUS | Status: AC
  Administered 2017-07-30: 50 meq via INTRAVENOUS
  Filled 2017-07-30: qty 50

## 2017-07-30 MED ORDER — DEXTROSE 5 % IV SOLN
200.0000 mg | Freq: Once | INTRAVENOUS | Status: AC
  Administered 2017-07-30: 200 mg via INTRAVENOUS
  Filled 2017-07-30: qty 20

## 2017-07-30 MED ORDER — ONDANSETRON HCL 4 MG/2ML IJ SOLN
4.0000 mg | Freq: Once | INTRAMUSCULAR | Status: AC
  Administered 2017-07-30: 4 mg via INTRAVENOUS
  Filled 2017-07-30: qty 2

## 2017-07-30 MED ORDER — INSULIN ASPART 100 UNIT/ML ~~LOC~~ SOLN
0.0000 [IU] | Freq: Three times a day (TID) | SUBCUTANEOUS | Status: DC
  Administered 2017-07-31: 3 [IU] via SUBCUTANEOUS
  Administered 2017-08-01: 2 [IU] via SUBCUTANEOUS
  Administered 2017-08-01: 3 [IU] via SUBCUTANEOUS
  Administered 2017-08-01: 1 [IU] via SUBCUTANEOUS
  Administered 2017-08-02: 2 [IU] via SUBCUTANEOUS
  Administered 2017-08-02: 5 [IU] via SUBCUTANEOUS
  Administered 2017-08-02: 1 [IU] via SUBCUTANEOUS
  Administered 2017-08-03: 3 [IU] via SUBCUTANEOUS
  Administered 2017-08-03 (×2): 7 [IU] via SUBCUTANEOUS
  Administered 2017-08-04: 5 [IU] via SUBCUTANEOUS
  Administered 2017-08-05: 2 [IU] via SUBCUTANEOUS
  Administered 2017-08-05: 7 [IU] via SUBCUTANEOUS
  Administered 2017-08-06: 3 [IU] via SUBCUTANEOUS
  Administered 2017-08-07: 5 [IU] via SUBCUTANEOUS
  Administered 2017-08-07: 3 [IU] via SUBCUTANEOUS
  Administered 2017-08-07: 1 [IU] via SUBCUTANEOUS
  Administered 2017-08-08: 9 [IU] via SUBCUTANEOUS
  Administered 2017-08-08: 2 [IU] via SUBCUTANEOUS

## 2017-07-30 MED ORDER — ALBUTEROL SULFATE (2.5 MG/3ML) 0.083% IN NEBU
10.0000 mg | INHALATION_SOLUTION | Freq: Once | RESPIRATORY_TRACT | Status: AC
  Administered 2017-07-30: 10 mg via RESPIRATORY_TRACT
  Filled 2017-07-30: qty 12

## 2017-07-30 MED ORDER — CALCIUM GLUCONATE 10 % IV SOLN
1.0000 g | Freq: Once | INTRAVENOUS | Status: AC
  Administered 2017-07-30: 1 g via INTRAVENOUS
  Filled 2017-07-30: qty 10

## 2017-07-30 MED ORDER — SODIUM POLYSTYRENE SULFONATE 15 GM/60ML PO SUSP
50.0000 g | Freq: Once | ORAL | Status: AC
  Administered 2017-07-30: 50 g via ORAL
  Filled 2017-07-30: qty 240

## 2017-07-30 MED ORDER — SODIUM CHLORIDE 0.9 % IV SOLN: Freq: Once | INTRAVENOUS | Status: DC

## 2017-07-30 MED ORDER — DEXTROSE 50 % IV SOLN
1.0000 | Freq: Once | INTRAVENOUS | Status: AC
  Administered 2017-07-30: 50 mL via INTRAVENOUS
  Filled 2017-07-30: qty 50

## 2017-07-30 MED ORDER — SODIUM CHLORIDE 0.9 % IV SOLN
2.0000 g | INTRAVENOUS | Status: DC
  Administered 2017-07-30 – 2017-08-05 (×7): 2 g via INTRAVENOUS
  Filled 2017-07-30 (×7): qty 20

## 2017-07-30 NOTE — Consult Note (Signed)
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  Over the last several days he has had increased abdominal and LE swelling, nausea, vomiting, and increased SOB.  He present to the ED via EMS where creat was 9.639mdl (on 03/12/17 it was 4.2728ml) and K 6.7.  Of note, ALT was 1408 and ALT was 2996.  He has a hx of proteinuria. Abd [place] 12/2015 revealed hepatic steatosis.  Bladder scan in ED revealed > 500cc urine in bladder. Wife denies alcohol.  Past Medical History:  Diagnosis Date  . Anxiety   . Depression   . Diabetes mellitus without complication ([facility] . Hypertension   . Peripheral neuropathy   . Renal disorder    Past Surgical History:  Procedure Laterality Date  . CARDIAC SURGERY    . CORONARY ARTERY BYPASS GRAFT     Social History:  reports that he has never smoked. He has never used smokeless tobacco. He reports that he does not drink alcohol or use drugs. Allergies:  Allergies  Allergen Reactions  . Penicillins Hives and Itching    Has patient had a PCN reaction causing immediate rash, facial/tongue/throat swelling, SOB or lightheadedness with hypotension: Yes Has patient had a PCN reaction causing severe rash involving mucus membranes or skin necrosis: Yes Has patient had a PCN reaction that required hospitalization No Has patient had a PCN reaction occurring within the last 10 years: No If all of the above answers are "NO", then may proceed with Cephalosporin use.    Family History  Problem Relation Age of Onset  . Diabetes Father   . Heart attack Brother   . Heart disease Brother   . Diabetes Mother   . Heart disease Mother   . Congestive Heart Failure Mother   . Heart disease Brother   . Heart attack Brother     Medications:  Prior to Admission:  (Not in a hospital admission) Scheduled: . [START ON 07/31/2017] insulin aspart  0-9 Units Subcutaneous TID WC    ROS: not obtainable from pt at this time due to resp distress Blood pressure 126/68, pulse (!) 54, temperature 97.8 F (36.6 C), temperature source Oral, resp. rate 18, height '5\' 3"'  (1.6 m), weight 74.8 kg (165 lb), SpO2 95 %.  General appearance: mild distress and slowed mentation Head: Normocephalic, without obvious abnormality, atraumatic Eyes: fundi not examined Resp: diminished breath sounds bilaterally Chest wall: no tenderness sternotomy well healed Cardio: regular rate and rhythm, S1, S2 normal, no murmur, click, rub or gallop GI: protuberant Extremities: edema 2+ Skin: some cherry angiomas on chest wall,  Neurologic: Mental status: lethargic  Positive asterixis Results for orders placed or performed during the hospital encounter of 07/30/17 (from the past 48 hour(s))  Comprehensive metabolic panel     Status: Abnormal   Collection Time: 07/30/17  1:57 PM  Result Value Ref Range   Sodium 130 (L) 135 - 145 mmol/L   Potassium 6.7 (HH) 3.5 - 5.1 mmol/L    Comment: NO VISIBLE HEMOLYSIS CRITICAL RESULT CALLED TO, READ BACK BY AND VERIFIED WITH: J. Vale Haven [phone] GREEN R    Chloride 91 (L) 101 - 111 mmol/L   CO2 17 (L) 22 - 32 mmol/L   Glucose, Bld 188 (H) 65 - 99 mg/dL   BUN 140 (H) 6 - 20 mg/dL   Creatinine, Ser 9.65 (H) 0.61 - 1.24 mg/dL  Calcium 7.0 (L) 8.9 - 10.3 mg/dL   Total Protein 6.4 (L) 6.5 - 8.1 g/dL   Albumin 3.4 (L) 3.5 - 5.0 g/dL   AST 1,408 (H) 15 - 41 U/L   ALT 2,996 (H) 17 - 63 U/L    Comment: RESULTS CONFIRMED BY MANUAL DILUTION   Alkaline Phosphatase 94 38 - 126 U/L   Total Bilirubin 1.2 0.3 - 1.2 mg/dL   GFR calc non Af Amer 5 (L) >60 mL/min   GFR calc Af Amer 6 (L) >60 mL/min    Comment: (NOTE) The eGFR has been calculated using the CKD EPI equation. This calculation has not been validated in all clinical situations. eGFR's persistently <60 mL/min signify possible Chronic Kidney Disease.    Anion gap 22 (H) 5 - 15    Comment: Performed  at [facility], [place] [address], [place], [place] [address]  CBC WITH DIFFERENTIAL     Status: Abnormal   Collection Time: 07/30/17  1:57 PM  Result Value Ref Range   WBC 14.4 (H) 4.0 - 10.5 K/uL   RBC 2.52 (L) 4.22 - 5.81 MIL/uL   Hemoglobin 7.6 (L) 13.0 - 17.0 g/dL    Comment: REPEATED TO VERIFY   HCT 22.4 (L) 39.0 - 52.0 %   MCV 88.9 78.0 - 100.0 fL   MCH 30.2 26.0 - 34.0 pg   MCHC 33.9 30.0 - 36.0 g/dL   RDW 13.8 11.5 - 15.5 %   Platelets 77 (L) 150 - 400 K/uL    Comment: REPEATED TO VERIFY SPECIMEN CHECKED FOR CLOTS PLATELET COUNT CONFIRMED BY SMEAR    Neutrophils Relative % 84 %   Lymphocytes Relative 6 %   Monocytes Relative 10 %   Eosinophils Relative 0 %   Basophils Relative 0 %   Band Neutrophils 0 %   Metamyelocytes Relative 0 %   Myelocytes 0 %   Promyelocytes Relative 0 %   Blasts 0 %   nRBC 0 0 /100 WBC   Other 0 %   Neutro Abs 12.1 (H) 1.7 - 7.7 K/uL   Lymphs Abs 0.9 0.7 - 4.0 K/uL   Monocytes Absolute 1.4 (H) 0.1 - 1.0 K/uL   Eosinophils Absolute 0.0 0.0 - 0.7 K/uL   Basophils Absolute 0.0 0.0 - 0.1 K/uL    Comment: Performed at [facility] [address], [place], [place] [address]  Troponin I ([facility])     Status: Abnormal   Collection Time: 07/30/17  1:57 PM  Result Value Ref Range   Troponin I 14.02 (HH) <0.03 ng/mL    Comment: CRITICAL RESULT CALLED TO, READ BACK BY AND VERIFIED WITH: Vale Haven RN [phone] GREEN R Performed at [facility] [address] [place], [place] [address]   Brain natriuretic peptide     Status: Abnormal   Collection Time: 07/30/17  1:58 PM  Result Value Ref Range   B Natriuretic Peptide 4,146.3 (H) 0.0 - 100.0 pg/mL    Comment: Performed at [facility] [address] [place], [place] [address]  Renal function panel     Status: Abnormal   Collection Time: 07/30/17  6:36 PM  Result Value Ref Range   Sodium 133 (L) 135 - 145 mmol/L   Potassium 4.9 3.5 - 5.1 mmol/L    Comment: DELTA CHECK  NOTED   Chloride 91 (L) 101 - 111 mmol/L   CO2 20 (L) 22 - 32 mmol/L   Glucose, Bld  228 (H) 65 - 99 mg/dL   BUN 146 (H) 6 - 20 mg/dL   Creatinine, Ser 9.73 (H) 0.61 - 1.24 mg/dL   Calcium 7.2 (L) 8.9 - 10.3 mg/dL   Phosphorus 11.9 (H) 2.5 - 4.6 mg/dL    Comment: RESULTS CONFIRMED BY MANUAL DILUTION   Albumin 3.4 (L) 3.5 - 5.0 g/dL   GFR calc non Af Amer 5 (L) >60 mL/min   GFR calc Af Amer 6 (L) >60 mL/min    Comment: (NOTE) The eGFR has been calculated using the CKD EPI equation. This calculation has not been validated in all clinical situations. eGFR's persistently <60 mL/min signify possible Chronic Kidney Disease.    Anion gap 22 (H) 5 - 15    Comment: Performed at [facility], [place] [address], [place], [place] [address]  Troponin I     Status: Abnormal   Collection Time: 07/30/17  6:36 PM  Result Value Ref Range   Troponin I 14.06 (HH) <0.03 ng/mL    Comment: CRITICAL RESULT CALLED TO, READ BACK BY AND VERIFIED WITH: Jamie Kato RN [phone] GREEN R Performed at [facility], [place] [address] [place], [place] [address]   Protime-INR     Status: Abnormal   Collection Time: 07/30/17  6:38 PM  Result Value Ref Range   Prothrombin Time 19.9 (H) 11.4 - 15.2 seconds   INR 1.71     Comment: Performed at [facility] [address] [place], [place] [address]  Vitamin B12     Status: Abnormal   Collection Time: 07/30/17  6:38 PM  Result Value Ref Range   Vitamin B-12 4,551 (H) 180 - 914 pg/mL    Comment: (NOTE) This assay is not validated for testing neonatal or myeloproliferative syndrome specimens for Vitamin B12 levels. Performed at [facility], [place] [address] [place], [place] [address]   Folate     Status: None   Collection Time: 07/30/17  6:38 PM  Result Value Ref Range   Folate 11.7 >5.9 ng/mL    Comment: Performed at [facility], [place] [address], [place], [place] [address]  Iron and TIBC     Status: Abnormal   Collection Time:  07/30/17  6:38 PM  Result Value Ref Range   Iron 230 (H) 45 - 182 ug/dL   TIBC 238 (L) 250 - 450 ug/dL   Saturation Ratios 97 (H) 17.9 - 39.5 %   UIBC 8 ug/dL    Comment: Performed at [facility] [address] [place], [place] [address]  Reticulocytes     Status: Abnormal   Collection Time: 07/30/17  6:38 PM  Result Value Ref Range   Retic Ct Pct 2.4 0.4 - 3.1 %   RBC. 2.44 (L) 4.22 - 5.81 MIL/uL   Retic Count, Absolute 58.6 19.0 - 186.0 K/uL    Comment: Performed at [facility] [address], [place], [place] [address]  Ammonia     Status: None   Collection Time: 07/30/17  6:38 PM  Result Value Ref Range   Ammonia 35 9 - 35 umol/L    Comment: Performed at [facility], [place] [address], [place], [place] [address]  Acetaminophen level     Status: Abnormal   Collection Time: 07/30/17  7:47 PM  Result Value Ref Range   Acetaminophen (Tylenol), Serum <10 (L) 10 - 30 ug/mL    Comment:  THERAPEUTIC CONCENTRATIONS VARY SIGNIFICANTLY. A RANGE OF 10-30 ug/mL MAY BE AN EFFECTIVE CONCENTRATION FOR MANY PATIENTS. HOWEVER, SOME ARE BEST TREATED AT CONCENTRATIONS OUTSIDE THIS RANGE. ACETAMINOPHEN CONCENTRATIONS >150 ug/mL AT 4 HOURS AFTER INGESTION AND >50 ug/mL AT 12 HOURS AFTER INGESTION ARE OFTEN ASSOCIATED WITH TOXIC REACTIONS. Performed at [facility], [place] [address] [place], [place] [address]   Ethanol     Status: None   Collection Time: 07/30/17  7:47 PM  Result Value Ref Range   Alcohol, Ethyl (B) <10 <10 mg/dL    Comment:        LOWEST DETECTABLE LIMIT FOR SERUM ALCOHOL IS 10 mg/dL FOR MEDICAL PURPOSES ONLY Performed at [facility], [place] [address], [place], [place] [address]   Type and screen [place]     Status: None (Preliminary result)   Collection Time: 07/30/17  8:00 PM  Result Value Ref Range   ABO/RH(D) A POS    Antibody Screen NEG    Sample Expiration      08/02/2017 Performed at [facility], [place] [address] [place], [place] [address]    Unit Number [id number]    Blood Component Type RED CELLS,LR    Unit division 00    Status of Unit ALLOCATED    Transfusion Status OK TO TRANSFUSE    Crossmatch Result Compatible   Prepare RBC     Status: None   Collection Time: 07/30/17  8:00 PM  Result Value Ref Range   Order Confirmation      ORDER PROCESSED BY BLOOD BANK Performed at [facility], [place] [address], [place], [place] [address]    Dg Chest 2 View  Result Date: 07/30/2017 CLINICAL DATA:  Abdominal pain and pressure. EXAM: CHEST - 2 VIEW COMPARISON:  12/29/2015 FINDINGS: Moderate cardiac enlargement status post prior CABG. Low bilateral lung volumes. There is no evidence of pulmonary edema, consolidation, pneumothorax, nodule or pleural fluid. The thoracic spine shows diffuse degenerative disc disease. IMPRESSION: Cardiac enlargement and low bilateral lung volumes. Electronically Signed   By: Aletta Edouard M.D.   On: 07/30/2017 15:28    Assessment:  1 Acute on CKD 2 Uremic syndrome 3 Hyperkalemia 4 Volume overload 5 Anemia 6 Hyperphosphatemia 7 Abnl LFTs, hx hepatic steatosis 8 Anemia  Plan: 1 foley drainage and if no improvement will need HD cath and HD  2 furosemide prn 3 Binders 4 iron studies/ ESA prn 5 Abd [place]  Estanislado Emms, MD 07/30/2017, 9:17 PM

## 2017-07-30 NOTE — ED Notes (Signed)
Bladder scan > 536 mL

## 2017-07-30 NOTE — ED Notes (Signed)
IV team at bedside 
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
## 2017-07-30 NOTE — ED Notes (Signed)
Contact numbers from family when needed  Janace Hoard (wife) [phone]  Lattie Haw (daughter) [phone]

## 2017-07-30 NOTE — H&P (Addendum)
[facility] Admission History and Physical Service Pager: [phone]  Patient name: Joseph Mcdonald Medical record number: [id number] Date of birth: [birth year] Age: 67 y.o. Gender: male  Primary Care Provider: Mercy Riding, MD Consultants: Nephrology/cardiology Code Status: Full code (obtained on admission)  Chief Complaint: Dyspnea and abdominal pain  Assessment and Plan: [name] is a 67 y.o. male presenting with dyspnea and abdominal pain. PMH is significant for CAD status post CABG 2011, CKD 4, DM-2, hypertension, GERD, chronic back pain, secondary hyperparathyroidism, anemia of renal disease, gout, bradycardia.   Dyspnea: likely due to fluid overload from acute on chronic renal failure.  Patient has history of diastolic heart failure.  Last echo on 10/3 2017 with EF of 55 to 60% and G2DD. BNP over 4000 but in the setting of renal failure. Surprisingly, he doesn't have a lot of edema or crackles on in his lungs.  He also denies orthopnea. CXR with cardiomegaly, but no significant pulmonary congestion.  Troponin 14.02 likely a combination of demand ischemia and acute renal failure. EKG with bradycardia,  junctional rhythm, RBBB, LAFB, nonspecific T abnormalities, lateral leads which is different from his prior ekg. Pneumonia is a possibility given leukocytosis with left shift but he has no cough or fever. -Admit to stepdown unit.  Attending Dr. Nori Riis. -Continue Lasix drip per nephrology recommendation -ACS rule out as below -GI consult in the morning -Echocardiogram -Oxygen as needed -Daily weights, I&O -Procalcitonin  Abdominal pain: abdomen distended and diffusely tender. Distention limits the exam. No significant ascites on my exam.  Given leukocytosis and left shift, concern for SBP.  He also have severe transaminitis. He has history of alcohol use with hepatic steatosis on previous ultrasound. -Abdominal US -Consider CT abdomen  w/o contrast depending of [place]  -work up for transaminitis as below -Ceftriaxone  Transaminitis: ALT 3000, AST 1500.  Could be due to congestive hepatopathy but patient without significant edema in his extremities.  Could be ischemic hepatitis or autoimmune. He has history of alcohol use disorder but sober for about a year. -Hepatitis panel, internal, PT/INR, Tylenol level -Consider autoimmune work-up -GI consult in the morning -Abdominal ultrasound -Ceftriaxone for possible intra-abdominal infection.  He has leukocytosis with left shift  CAD status post CABG in 2011.  Currently with dyspnea but no chest pain. Initial EKG with bradycardia, junctional rhythm, RBBB and LAFB which could be due to hyperkalemia.  Troponin 14.01.  Discussed with cardiology (Dr. Debara Pickett) who suggested cycling troponin and EKG and getting an Echo -Cycle troponin -Repeat EKG in the morning -Echocardiogram -Formal cardiology consult in the morning  Acute on chronic CKD-4: Serum creatinine elevated to 9.65.  Baseline about 3.8.  BUN 140.  Patient with asterixis on exam. -Follow nephrology rec -IV Lasix as above -Renal ultrasound -FEUrea  Hyperkalemia: K6.7 on arrival.  Likely due to AKI.  S/p calcium gluconate, albuterol, insulin, dextrose and Kayexalate in ED.  Repeat K4.9. -Repeat BMP  AGMA: Likely due to acute renal failure/uremia. -Trend BMP  Anemia of chronic disease: Hemoglobin 7.6.  Baseline 9-10.  Could be dilutional due to possible fluid overload as well. -Anemia panel -Daily CBC -Transfuse 1 unit.  Hold 2 units.  Thrombocytopenia: Platelets 77.  Likely due to consumptive process versus production. -Daily CBC  DM2: Well controlled.  Last A1c 7.2 about a month ago.  On Lantus and Victoza at home. -SSI-thin -CBG ACHS  Hypertension: Normotensive.  On clonidine patch, nifedipine and hydralazine -Consider discontinuing clonidine given bradycardia -  Resume other home meds when able  FEN/GI: -N.p.o.  except for sips with meds  Prophylaxis:  -SCD given thrombocytopenia  Disposition: Admit to stepdown  History of Present Illness:  Joseph Mcdonald is a 67 y.o. male presenting with dyspnea and abdominal pain. Wife at bedside contributed to history. Patient reports feeling sick over the weekend.  He reports emesis Saturday and Sunday.  Last emesis was Monday morning.  He denies blood or bile in emesis.  He also reports poor appetite over the last 4 days.  He denies diarrhea.  Last bowel movement was this morning.  Stool without hematochezia or melena.  He also felt shaky yesterday.  Wife states that his CBG was close to 500 yesterday.  He also had abdominal pain and distention and dyspepsia for the last 4 days.  He denies chest pain.  He describes the abdominal pain as a lot of pressure.  He denies fever but admits to chills.  He denies orthopnea or paroxysmal nocturnal dyspnea.  Wife states that he was disoriented yesterday. He denies drinking alcohol.  He says he has been sober for almost a year.  He denies recreational drug use. He denies over-the-counter pain medication.  ED course: CMP significant for hyponatremia to 130, hyperkalemia to 6.7, AGMA, hemoglobin 7.6, ALT 3000, AST 1500, BNP 4000, troponin 14.  Chest x-ray with significant cardiomegaly.  Patient was given calcium gluconate, insulin, dextrose, albuterol and Kayexalate.  He was then started on Lasix drip per nephrology recommendation.  Family medicine was consulted to admit patient for further management.  Review Of Systems:  Review of Systems  Constitutional: Positive for chills. Negative for fever and weight loss.  HENT: Negative for congestion and sore throat.   Eyes: Negative for blurred vision, photophobia and pain.  Respiratory: Positive for shortness of breath. Negative for cough and wheezing.   Cardiovascular: Negative for chest pain, palpitations, orthopnea, leg swelling and PND.  Gastrointestinal: Positive for  abdominal pain. Negative for blood in stool, diarrhea, melena and vomiting.  Genitourinary: Negative for dysuria and hematuria.  Musculoskeletal: Negative for falls and myalgias.  Skin: Negative for rash.  Neurological: Positive for tremors. Negative for speech change, focal weakness, loss of consciousness, weakness and headaches.  Endo/Heme/Allergies: Does not bruise/bleed easily.  Psychiatric/Behavioral: Negative for substance abuse. The patient is not nervous/anxious.     Patient Active Problem List   Diagnosis Date Noted  . Fall from scooter (nonmotorized), initial encounter 07/14/2017  . Type 2 diabetes mellitus with stage 4 chronic kidney disease ([place]) 07/08/2017  . Basal cell carcinoma 08/30/2016  . Insomnia 04/20/2016  . Bradycardia 04/20/2016  . Gout 02/20/2016  . Routine adult health maintenance 01/23/2016  . History of ETOH abuse 12/29/2015  . Syncope and collapse   . Chronic kidney disease, stage 4 (severe) ([place])   . Abnormal ECG 12/26/2015  . Secondary hyperparathyroidism of renal origin ([place]) 12/19/2015  . Anemia of chronic disease 12/19/2015  . Chronic back pain 11/20/2015  . Depression 10/20/2015  . Generalized anxiety disorder 10/20/2015  . Alcohol withdrawal ([place]) 10/11/2015  . Uncontrolled secondary diabetes mellitus with stage 4 CKD (GFR 15-29) ([place]) 10/11/2015  . Hyperlipidemia 01/18/2010  . Essential hypertension 01/18/2010  . Diabetic neuropathy ([place]) 01/17/2010  . Coronary artery disease involving native heart without angina pectoris 01/17/2010    Past Medical History: Past Medical History:  Diagnosis Date  . Anxiety   . Depression   . Diabetes mellitus without complication ([place])   . Hypertension   .  Peripheral neuropathy   . Renal disorder     Past Surgical History: Past Surgical History:  Procedure Laterality Date  . CARDIAC SURGERY    . CORONARY ARTERY BYPASS GRAFT      Social History: Social History   Tobacco Use  . Smoking status:  Never Smoker  . Smokeless tobacco: Never Used  Substance Use Topics  . Alcohol use: No  . Drug use: No   Additional social history:   Please also refer to relevant sections of EMR.  Family History: Family History  Problem Relation Age of Onset  . Diabetes Father   . Heart attack Brother   . Heart disease Brother   . Diabetes Mother   . Heart disease Mother   . Congestive Heart Failure Mother   . Heart disease Brother   . Heart attack Brother    (If not completed, MUST add something in)  Allergies and Medications: Allergies  Allergen Reactions  . Penicillins Hives and Itching    Has patient had a PCN reaction causing immediate rash, facial/tongue/throat swelling, SOB or lightheadedness with hypotension: Yes Has patient had a PCN reaction causing severe rash involving mucus membranes or skin necrosis: Yes Has patient had a PCN reaction that required hospitalization No Has patient had a PCN reaction occurring within the last 10 years: No If all of the above answers are "NO", then may proceed with Cephalosporin use.    No current facility-administered medications on file prior to encounter.    Current Outpatient Medications on File Prior to Encounter  Medication Sig Dispense Refill  . allopurinol (ZYLOPRIM) 100 MG tablet Take 1 tablet (100 mg total) by mouth daily. 30 tablet 6  . aspirin 81 MG tablet Take 1 tablet (81 mg total) by mouth daily. 30 tablet   . atorvastatin (LIPITOR) 40 MG tablet Take 1 tablet (40 mg total) by mouth daily. 90 tablet 3  . Blood Glucose Monitoring Suppl (ACCU-CHEK AVIVA PLUS) w/Device KIT 1 kit by Does not apply route 4 (four) times daily -  before meals and at bedtime. 1 kit 0  . cloNIDine (CATAPRES - DOSED IN MG/24 HR) 0.1 mg/24hr patch Place 1 patch (0.1 mg total) onto the skin once a week. 4 patch 12  . escitalopram (LEXAPRO) 10 MG tablet Take 1 tablet (10 mg total) by mouth daily. 90 tablet 3  . gabapentin (NEURONTIN) 300 MG capsule Take 1  capsule (300 mg total) by mouth 2 (two) times daily. 180 capsule 3  . glucagon 1 MG injection Inject 1 mg into the muscle once as needed. (Patient not taking: Reported on 01/08/2017) 1 each 12  . glucose blood (ACCU-CHEK AVIVA PLUS) test strip Checked your blood glucose 4 times a day (before meals and at bedtime) 100 each 12  . hydrALAZINE (APRESOLINE) 50 MG tablet Take 1 tablet (50 mg total) by mouth 3 (three) times daily. 90 tablet 1  . Insulin Glargine (BASAGLAR KWIKPEN) 100 UNIT/ML SOPN Inject 0.2 mLs (20 Units total) into the skin every morning. 15 mL 3  . Insulin Pen Needle 31G X 8 MM MISC Use one needle to inject insulin 4 times a day 100 each 11  . Lancets MISC Use to check your blood glucose 4 times a day (before meals and at bedtime) 100 each 0  . liraglutide (VICTOZA) 18 MG/3ML SOPN Inject 1.2 mg once daily; if your blood glucose is greater than 140 mg/dL, may increase to 1.8 mg once daily. 9 mL 3  . naloxone (  NARCAN) 2 MG/2ML injection Inject 0.4 mLs (0.4 mg total) into the muscle as needed (for opiod overdose. May repeat in 2-3 minutes if needed.). 2 mL 0  . NARCAN 4 MG/0.1ML LIQD nasal spray kit     . NIFEdipine (PROCARDIA XL/ADALAT-CC) 60 MG 24 hr tablet Take 1 tablet (60 mg total) by mouth daily. 90 tablet 0  . oxyCODONE-acetaminophen (ROXICET) 5-325 MG tablet Take 1 tablet by mouth every 8 (eight) hours as needed for severe pain. 90 tablet 0  . polyethylene glycol (MIRALAX / GLYCOLAX) packet Take 17 g by mouth 2 (two) times daily. 14 each 0  . traZODone (DESYREL) 50 MG tablet Take 1 tablet (50 mg total) by mouth at bedtime as needed for sleep. 90 tablet 3    Objective: BP (!) 152/67   Pulse 69   Temp 97.8 F (36.6 C) (Oral)   Resp (!) 21   Ht _0  (1.6 m)   Wt 165 lb (74.8 kg) Comment: Pt's wife requested him to be weighed; Pt presents with aceites/distention  SpO2 99%   BMI 29.23 kg/m  Exam: GEN: Lying in bed.  Appears sick Head: normocephalic and atraumatic.  Face  looks a little bit puffy Eyes: conjunctiva without injection. Sclera anicteric. Oropharynx: MMM. No erythema. HEM: negative for cervical or periauricular lymphadenopathies CVS: RRR, nl s1 & s2, 2/6 SEM  murmurs, trace edema.  Hard to appreciate JVD due to body habitus. RESP: On nonrebreather.  Mild increased work of breathing, good air movement anteriorly.  GI: BS present.  Distended and diffusely tender to palpation.  Resonant to percussion.  Further exam is limited due to abdominal distention. GU: No scrotal swelling MSK: no focal tenderness or notable swelling SKIN: no apparent skin lesion NEURO: alert and oiented appropriately, no gross deficits.  Asterixis PSYCH: euthymic mood with congruent affect  Labs and Imaging: CBC BMET  Recent Labs  Lab 07/30/17 1357  WBC 14.4*  HGB 7.6*  HCT 22.4*  PLT 77*   Recent Labs  Lab 07/30/17 1357  NA 130*  K 6.7*  CL 91*  CO2 17*  BUN 140*  CREATININE 9.65*  GLUCOSE 188*  CALCIUM 7.0*     Dg Chest 2 View  Result Date: 07/30/2017 CLINICAL DATA:  Abdominal pain and pressure. EXAM: CHEST - 2 VIEW COMPARISON:  12/29/2015 FINDINGS: Moderate cardiac enlargement status post prior CABG. Low bilateral lung volumes. There is no evidence of pulmonary edema, consolidation, pneumothorax, nodule or pleural fluid. The thoracic spine shows diffuse degenerative disc disease. IMPRESSION: Cardiac enlargement and low bilateral lung volumes. Electronically Signed   By: Aletta Edouard M.D.   On: 07/30/2017 15:28    Mercy Riding, MD 07/30/2017, 5:40 PM PGY-3, [place] Intern pager: [phone], text pages welcome

## 2017-07-30 NOTE — ED Triage Notes (Signed)
Pt arrived via [facility] from home c/o SOB arrives on 10L O2 NRB, decreased urine output, abdominal pressure/pain wrapping around to bilateral flanks.  Pt has stage 4 kidney disease and hasn't started dialysis but wife at bedside states they have been talking about it.

## 2017-07-30 NOTE — ED Notes (Signed)
Pt returned to room from US.

## 2017-07-30 NOTE — ED Notes (Signed)
Patient transported to X-ray 

## 2017-07-30 NOTE — ED Notes (Signed)
Patient transported to Ultrasound 

## 2017-07-30 NOTE — ED Notes (Signed)
Nurse will call IV team to start IV and get 2nd set blood cultures.

## 2017-07-31 ENCOUNTER — Other Ambulatory Visit: Payer: Self-pay

## 2017-07-31 ENCOUNTER — Encounter (HOSPITAL_COMMUNITY): Payer: Self-pay | Admitting: Cardiology

## 2017-07-31 ENCOUNTER — Other Ambulatory Visit (HOSPITAL_COMMUNITY): Payer: Medicare Other

## 2017-07-31 ENCOUNTER — Inpatient Hospital Stay (HOSPITAL_COMMUNITY): Payer: Medicare Other

## 2017-07-31 DIAGNOSIS — L8990 Pressure ulcer of unspecified site, unspecified stage: Secondary | ICD-10-CM

## 2017-07-31 DIAGNOSIS — I214 Non-ST elevation (NSTEMI) myocardial infarction: Secondary | ICD-10-CM

## 2017-07-31 DIAGNOSIS — I2510 Atherosclerotic heart disease of native coronary artery without angina pectoris: Secondary | ICD-10-CM

## 2017-07-31 DIAGNOSIS — I5043 Acute on chronic combined systolic (congestive) and diastolic (congestive) heart failure: Secondary | ICD-10-CM

## 2017-07-31 DIAGNOSIS — N184 Chronic kidney disease, stage 4 (severe): Secondary | ICD-10-CM

## 2017-07-31 DIAGNOSIS — I361 Nonrheumatic tricuspid (valve) insufficiency: Secondary | ICD-10-CM

## 2017-07-31 DIAGNOSIS — E119 Type 2 diabetes mellitus without complications: Secondary | ICD-10-CM

## 2017-07-31 LAB — HEMOGLOBIN AND HEMATOCRIT, BLOOD
HEMATOCRIT: 23.9 % — AB (ref 39.0–52.0)
HEMOGLOBIN: 8.1 g/dL — AB (ref 13.0–17.0)

## 2017-07-31 LAB — BASIC METABOLIC PANEL
Anion gap: 23 — ABNORMAL HIGH (ref 5–15)
BUN: 161 mg/dL — AB (ref 6–20)
CHLORIDE: 93 mmol/L — AB (ref 101–111)
CO2: 15 mmol/L — AB (ref 22–32)
CREATININE: 10.24 mg/dL — AB (ref 0.61–1.24)
Calcium: 6.2 mg/dL — CL (ref 8.9–10.3)
GFR calc Af Amer: 5 mL/min — ABNORMAL LOW (ref >60)
GFR calc non Af Amer: 5 mL/min — ABNORMAL LOW (ref >60)
Glucose, Bld: 256 mg/dL — ABNORMAL HIGH (ref 65–99)
Potassium: 6.8 mmol/L (ref 3.5–5.1)
Sodium: 131 mmol/L — ABNORMAL LOW (ref 135–145)

## 2017-07-31 LAB — HEPATITIS PANEL, ACUTE
HCV Ab: <0.1 s/co ratio (ref 0.0–0.9)
HEP B S AG: NEGATIVE
Hep A IgM: NEGATIVE
Hep B C IgM: NEGATIVE

## 2017-07-31 LAB — GLUCOSE, CAPILLARY
Glucose-Capillary: 246 mg/dL — ABNORMAL HIGH (ref 65–99)
Glucose-Capillary: 155 mg/dL — ABNORMAL HIGH (ref 65–99)
Glucose-Capillary: 156 mg/dL — ABNORMAL HIGH (ref 65–99)

## 2017-07-31 LAB — FERRITIN

## 2017-07-31 LAB — TROPONIN I
TROPONIN I: 11.03 ng/mL — AB (ref <0.03)
TROPONIN I: 11.58 ng/mL — AB (ref <0.03)
Troponin I: 11.75 ng/mL (ref <0.03)
Troponin I: 11.3 ng/mL (ref <0.03)

## 2017-07-31 LAB — MRSA PCR SCREENING: MRSA BY PCR: NEGATIVE

## 2017-07-31 MED ORDER — GELATIN ABSORBABLE 12-7 MM EX MISC
1.0000 | Freq: Once | CUTANEOUS | Status: AC
  Administered 2017-07-31: 1 via TOPICAL
  Filled 2017-07-31: qty 1

## 2017-07-31 MED ORDER — ONDANSETRON HCL 4 MG/2ML IJ SOLN
4.0000 mg | Freq: Three times a day (TID) | INTRAMUSCULAR | Status: DC | PRN
  Administered 2017-07-31: 4 mg via INTRAVENOUS
  Filled 2017-07-31: qty 2

## 2017-07-31 MED ORDER — BISACODYL 10 MG RE SUPP
10.0000 mg | Freq: Every morning | RECTAL | Status: AC
  Administered 2017-07-31 – 2017-08-05 (×5): 10 mg via RECTAL
  Filled 2017-07-31 (×7): qty 1

## 2017-07-31 MED ORDER — GELATIN ABSORBABLE 12-7 MM EX MISC: 1.0000 | Freq: Once | CUTANEOUS | Status: DC

## 2017-07-31 MED ORDER — INSULIN ASPART 100 UNIT/ML ~~LOC~~ SOLN
5.0000 [IU] | Freq: Once | SUBCUTANEOUS | Status: AC
  Administered 2017-07-31: 5 [IU] via SUBCUTANEOUS

## 2017-07-31 MED ORDER — HYDRALAZINE HCL 20 MG/ML IJ SOLN
5.0000 mg | Freq: Four times a day (QID) | INTRAMUSCULAR | Status: DC | PRN
  Administered 2017-08-01 – 2017-08-02 (×3): 5 mg via INTRAVENOUS
  Filled 2017-07-31 (×4): qty 1

## 2017-07-31 MED ORDER — LIDOCAINE HCL (PF) 1 % IJ SOLN
INTRAMUSCULAR | Status: AC
  Filled 2017-07-31: qty 4

## 2017-07-31 MED ORDER — ORAL CARE MOUTH RINSE
15.0000 mL | Freq: Two times a day (BID) | OROMUCOSAL | Status: DC
  Administered 2017-07-31 – 2017-08-09 (×15): 15 mL via OROMUCOSAL

## 2017-07-31 NOTE — Progress Notes (Addendum)
Inpatient Diabetes Program Recommendations  AACE/ADA: New Consensus Statement on Inpatient Glycemic Control (2015)  Target Ranges:  Prepandial:   less than 140 mg/dL      Peak postprandial:   less than 180 mg/dL (1-2 hours)      Critically ill patients:  140 - 180 mg/dL   Lab Results  Component Value Date   GLUCAP 246 (H) 07/31/2017   HGBA1C 7.2 07/08/2017    Review of Glycemic Control  Diabetes history: Type 2 Outpatient Diabetes medications: Basalglar 20 units daily, Victoza 1.2 mg daily; 1.8 mg daily if CBG >140 mg/dl. Current orders for Inpatient glycemic control: Novolog SENSITIVE TID  Inpatient Diabetes Program Recommendations:    CBGs have been 266-246 mg/dl. Recommend adding Lantus 15 units daily and continue Novolog SENSITIVE TID if blood sugars continue to be greater than 200 mg/dl.   Harvel Ricks RN BSN CDE Diabetes Coordinator Pager: [phone]  8am-5pm

## 2017-07-31 NOTE — Progress Notes (Signed)
FPTS Interim Progress Note  S:Patient had nurse page asking for diet  O: BP (!) 167/78 (BP Location: Right Arm)   Pulse 79   Temp 98.1 F (36.7 C) (Oral)   Resp 17   Ht 5\' 4"  (1.626 m)   Wt 179 lb 3.7 oz (81.3 kg)   SpO2 98%   BMI 30.77 kg/m     A/P: Went to room to evaluate patient.  Improved but still significant distention, no BM since admission.  Had not received first dose of dulcolax since he was at HD.    Asked nurse to administer dulcolax, will offer oral care but no PO until cleared by GI or exhibits more clinical improvement  *Also, fem cath had some blood leakage but still within the dressing and the seal did not appear to be ruptured, no pain per patient.  Sherene Sires, DO 07/31/2017, 5:00 PM PGY-1, [place] Medicine Service pager [phone]

## 2017-07-31 NOTE — Consult Note (Signed)
Referring Provider: No ref. provider found Primary Care Physician:  Mercy Riding, MD Primary Nephrologist:    Reason for Consultation:     HPI: 67 y.o. male presenting with dyspnea and abdominal pain. PMH is significant for CAD status post CABG 2011, CKD 4, DM-2, hypertension, GERD, chronic back pain, secondary hyperparathyroidism, anemia of renal disease, gout, bradycardia. He has been followed by nephrology and has a baseline creatinine of about 3.8  This has increased to about 10 with a K that has increased to above 6  He was admitted with abdominal pain and dyspnea and a GI evaluation has been asked for. He has increased in his liver enzymes and an abdominal ultrasound has been requested. He has had a CABG in the past and cardiology has been asked to evaluate the patient.     Past Medical History:  Diagnosis Date  . Anxiety   . Depression   . Diabetes mellitus without complication ([place])   . Hypertension   . Peripheral neuropathy   . Renal disorder     Past Surgical History:  Procedure Laterality Date  . CARDIAC SURGERY    . CORONARY ARTERY BYPASS GRAFT      Prior to Admission medications   Medication Sig Start Date End Date Taking? Authorizing Provider  allopurinol (ZYLOPRIM) 100 MG tablet Take 1 tablet (100 mg total) by mouth daily. Patient taking differently: Take 100 mg by mouth every other day.  07/08/17  Yes Mercy Riding, MD  aspirin 81 MG tablet Take 1 tablet (81 mg total) by mouth daily. 03/01/13  Yes Fay Records, MD  atorvastatin (LIPITOR) 40 MG tablet Take 1 tablet (40 mg total) by mouth daily. 08/23/16  Yes Mercy Riding, MD  cloNIDine (CATAPRES - DOSED IN MG/24 HR) 0.1 mg/24hr patch Place 1 patch (0.1 mg total) onto the skin once a week. 03/12/17  Yes Mercy Riding, MD  escitalopram (LEXAPRO) 10 MG tablet Take 1 tablet (10 mg total) by mouth daily. 07/15/17  Yes Mercy Riding, MD  gabapentin (NEURONTIN) 300 MG capsule Take 1 capsule (300 mg total) by mouth 2 (two)  times daily. 08/23/16  Yes Mercy Riding, MD  hydrALAZINE (APRESOLINE) 50 MG tablet Take 1 tablet (50 mg total) by mouth 3 (three) times daily. 04/02/17  Yes Mercy Riding, MD  Insulin Glargine (BASAGLAR KWIKPEN) 100 UNIT/ML SOPN Inject 0.2 mLs (20 Units total) into the skin every morning. 07/14/17  Yes Gonfa, Charlesetta Ivory, MD  liraglutide (VICTOZA) 18 MG/3ML SOPN Inject 1.2 mg once daily; if your blood glucose is greater than 140 mg/dL, may increase to 1.8 mg once daily. 07/08/17  Yes Mercy Riding, MD  NARCAN 4 MG/0.1ML LIQD nasal spray kit Place 1 spray into the nose once.  08/23/16  Yes [provider]  NIFEdipine (PROCARDIA XL/ADALAT-CC) 60 MG 24 hr tablet Take 1 tablet (60 mg total) by mouth daily. Patient taking differently: Take 60 mg by mouth 2 (two) times daily.  04/10/17  Yes Mercy Riding, MD  oxyCODONE-acetaminophen (ROXICET) 5-325 MG tablet Take 1 tablet by mouth every 8 (eight) hours as needed for severe pain. 07/24/17  Yes Mercy Riding, MD  polyethylene glycol (MIRALAX / GLYCOLAX) packet Take 17 g by mouth 2 (two) times daily. Patient taking differently: Take 17 g by mouth daily as needed.  08/28/16  Yes Mercy Riding, MD  sodium bicarbonate 650 MG tablet Take 650 mg by mouth 3 (three) times daily.  Yes [provider]  traZODone (DESYREL) 50 MG tablet Take 1 tablet (50 mg total) by mouth at bedtime as needed for sleep. 08/23/16  Yes Mercy Riding, MD    Current Facility-Administered Medications  Medication Dose Route Frequency Provider Last Rate Last Dose  . 0.9 %  sodium chloride infusion   Intravenous Once Wendee Beavers T, MD      . calcium acetate (PHOSLO) capsule 2,001 mg  2,001 mg Oral TID WC Estanislado Emms, MD      . cefTRIAXone (ROCEPHIN) 2 g in sodium chloride 0.9 % 100 mL IVPB  2 g Intravenous Q24H Mercy Riding, MD   Stopped at 07/30/17 2218  . insulin aspart (novoLOG) injection 0-9 Units  0-9 Units Subcutaneous TID WC Mercy Riding, MD   3 Units at 07/31/17 0834  .  MEDLINE mouth rinse  15 mL Mouth Rinse BID Dickie La, MD   15 mL at 07/31/17 0852  . ondansetron (ZOFRAN) injection 4 mg  4 mg Intravenous Q8H PRN Shirley, [place], DO   4 mg at 07/31/17 0118    Allergies as of 07/30/2017 - Review Complete 07/30/2017  Allergen Reaction Noted  . Penicillins Hives and Itching     Family History  Problem Relation Age of Onset  . Diabetes Father   . Heart attack Brother   . Heart disease Brother   . Diabetes Mother   . Heart disease Mother   . Congestive Heart Failure Mother   . Heart disease Brother   . Heart attack Brother     Social History   Socioeconomic History  . Marital status: Married    Spouse name: Not on file  . Number of children: Not on file  . Years of education: Not on file  . Highest education level: Not on file  Occupational History  . Occupation: Disabled/Retired  Social Needs  . Financial resource strain: Not on file  . Food insecurity:    Worry: Not on file    Inability: Not on file  . Transportation needs:    Medical: Not on file    Non-medical: Not on file  Tobacco Use  . Smoking status: Never Smoker  . Smokeless tobacco: Never Used  Substance and Sexual Activity  . Alcohol use: No  . Drug use: No  . Sexual activity: Yes  Lifestyle  . Physical activity:    Days per week: Not on file    Minutes per session: Not on file  . Stress: Not on file  Relationships  . Social connections:    Talks on phone: Not on file    Gets together: Not on file    Attends religious service: Not on file    Active member of club or organization: Not on file    Attends meetings of clubs or organizations: Not on file    Relationship status: Not on file  . Intimate partner violence:    Fear of current or ex partner: Not on file    Emotionally abused: Not on file    Physically abused: Not on file    Forced sexual activity: Not on file  Other Topics Concern  . Not on file  Social History Narrative   Pt lives in [place] with  wife.  He  Has 13 children.  He is disabled from [place].      Current Social History 01/08/2017        Patient lives with wife Levada Dy in one level home 01/08/2017  Transportation: Patient drives moped with helmet and safety vest around [place]. Daughter drives him to appts in [facility] 01/08/2017   Important Relationships "My family" 01/08/2017    Pets: None 01/08/2017   Education / Work:  7 th grade/ Has part-time job raking leaves and weed-eating around [place] 01/08/2017   Interests / Fun: Watching sports (football and racing), fishing 01/08/2017   Current Stressors: None 01/08/2017   Religious / Personal Beliefs: [employer] 01/08/2017   L. [place]atte, RN, BSN                                                                                                  Review of Systems: Gen:  , + chills, sweats, + anorexia, + fatigue, + weakness, HEENT: No visual complaints, No history of Retinopathy. Normal external appearance No Epistaxis or Sore throat. No sinusitis.   CV: Denies chest pain, angina, palpitations, syncope, orthopnea, PND, peripheral edema, and claudication. Resp:  dyspnea at rest, dyspnea with exercise, no cough, sputum, wheezing, coughing up blood, and pleurisy. 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  No renal calculi. MS: Denies joint pain, limitation of movement, and swelling, stiffness, low back pain, extremity pain. Denies muscle weakness, cramps, atrophy.  No use of non steroidal antiinflammatory drugs. Derm: Denies rash, itching, dry skin, hives, moles, warts, or unhealing ulcers.  Psych: Denies depression, anxiety, memory loss, suicidal ideation, hallucinations, paranoia, and confusion. Heme: Denies bruising, bleeding, and enlarged lymph nodes. Neuro: tremors   Endocrine  DM.  Thyroid disease.  No  Adrenal disease.  Physical Exam: Vital signs in last 24 hours: Temp:  [97.5 F (36.4 C)-98.1 F (36.7 C)] 97.5 F (36.4 C) (05/09 0745) Pulse Rate:  [41-69] 51 (05/09 0800) Resp:  [15-22] 18 (05/09 0800) BP: (112-152)/(58-87) 142/63 (05/09 0800) SpO2:  [91 %-100 %] 95 % (05/09 0800) Weight:  [165 lb (74.8 kg)-177 lb 4 oz (80.4 kg)] 177 lb 4 oz (80.4 kg) (05/09 0500) Last BM Date: 07/30/17 General:   Somnolent but appropriate  Head:  Normocephalic and atraumatic. Eyes:  Sclera clear, no icterus.   Conjunctiva pink. Ears:  Normal auditory acuity. Nose:  No deformity, discharge,  or lesions. Mouth:  No deformity or lesions, dentition normal. Neck:  Supple; no masses or thyromegaly. JVP not elevated Lungs:  Clear throughout to auscultation.   No wheezes, crackles, or rhonchi. No acute distress. Heart:  Regular rate and rhythm; no murmurs, clicks, rubs,  or gallops. Abdomen:  Soft, nontender and nondistended. No masses, hepatosplenomegaly or hernias noted. Normal bowel sounds, without guarding, and without rebound.   Msk:  Symmetrical without gross deformities. Normal posture. Pulses:  No carotid, renal, femoral bruits. DP and PT symmetrical and equal Extremities:  Without clubbing or edema. Neurologic:  Alert and  oriented x4;  grossly normal neurologically. Skin:  Intact without significant lesions or rashes.    Intake/Output from previous day: 05/08 0701 - 05/09 0700 In: 485 [I.V.:20; Blood:315; IV  Piggyback:150] Out: 830 [Urine:830] Intake/Output this shift: Total I/O In: -  Out: 100 [Urine:100]  Lab Results: Recent Labs    07/30/17 1357 07/31/17 0629  WBC 14.4*  --   HGB 7.6* 8.1*  HCT 22.4* 23.9*  PLT 77*  --    BMET Recent Labs    07/30/17 1357 07/30/17 1836 07/31/17 0629  NA 130* 133* 132*  K 6.7* 4.9 6.6*  CL 91* 91* 93*  CO2 17* 20* 17*  GLUCOSE 188* 228* 271*  BUN 140* 146* 159*  CREATININE 9.65* 9.73* 10.08*  CALCIUM 7.0* 7.2* 6.5*  PHOS  --   11.9*  --    LFT Recent Labs    07/31/17 0629  PROT 6.1*  ALBUMIN 3.2*  AST 729*  ALT 2,366*  ALKPHOS 104  BILITOT 0.8   PT/INR Recent Labs    07/30/17 1838  LABPROT 19.9*  INR 1.71   Hepatitis Panel Recent Labs    07/30/17 1838  HEPBSAG Negative  HCVAB <0.1  HEPAIGM Negative  HEPBIGM Negative    Studies/Results: Dg Chest 2 View  Result Date: 07/30/2017 CLINICAL DATA:  Abdominal pain and pressure. EXAM: CHEST - 2 VIEW COMPARISON:  12/29/2015 FINDINGS: Moderate cardiac enlargement status post prior CABG. Low bilateral lung volumes. There is no evidence of pulmonary edema, consolidation, pneumothorax, nodule or pleural fluid. The thoracic spine shows diffuse degenerative disc disease. IMPRESSION: Cardiac enlargement and low bilateral lung volumes. Electronically Signed   By: Aletta Edouard M.D.   On: 07/30/2017 15:28   US Abdomen Complete  Result Date: 07/30/2017 CLINICAL DATA:  Acute kidney injury, elevated LFTs. Hepatic steatosis. EXAM: ABDOMEN ULTRASOUND COMPLETE COMPARISON:  Ultrasound the abdomen limited 12/30/2015. FINDINGS: Gallbladder: No gallstones or wall thickening visualized. No sonographic Murphy sign noted by sonographer. Common bile duct: Diameter: Normal measuring 4.8 mm Liver: Dense echotexture. No focal lesions identified. Portal vein is patent on color Doppler imaging with normal direction of blood flow towards the liver. IVC: No abnormality visualized. Pancreas: Visualized portion unremarkable. Spleen: Size and appearance within normal limits. Right Kidney: Length: 10.4 cm. Echogenicity within normal limits. No mass or hydronephrosis visualized. 2.5 cm cyst. Left Kidney: Length: 11.5 cm. Echogenicity within normal limits. No mass or hydronephrosis visualized. 2.0 cm cyst. Abdominal aorta: No aneurysm visualized. Other findings: Trace ascites. IMPRESSION: Unremarkable ultrasound of the abdomen. No acute intra-abdominal findings. Uncomplicated appearing renal  cystic disease without hydronephrosis or solid mass. Hepatic steatosis.  No gallstones or biliary ductal dilatation. Electronically Signed   By: Staci Righter M.D.   On: 07/30/2017 23:35    Assessment/Plan:  Acute on renal disease  This may just be progression of his renal failure  Stage 4 at baseline   I think that dialysis will help  Especially as he appears somewhat somnolent . Work up most likely diabetic nephropathy although I think sending urine studies and a renal ultrasound may be helpful  Hyperkalemia  Will plan urgent dialysis  Metabolic acidosis  Should improve with dialysis   Anemia  Check iron stores  Bones check PTH  Access will ask [facility] to see patient   LOS: 1 Nusayba Cadenas W _0 _1 :33 AM

## 2017-07-31 NOTE — Progress Notes (Signed)
Placement of Right Dialysis catheter  U/S guided placement of guidewire-- Successful withdraw of venous blood and venous flashback Blunt dissection and placement of dialysis catheter

## 2017-07-31 NOTE — Consult Note (Signed)
[facility] Consultation Note  Referring Provider: Dr. Dorcas Mcmurray (Family practice teaching service) Primary Care Physician:  Mercy Riding, MD  Reason for Consultation:  Elevated LFTs, abdominal pain  HPI: Joseph Mcdonald is a 67 y.o. male admitted for abdominal pain, elevated LFTs, progressive renal failure.  Pain started few days ago, associated with distention.  No blood in stool.  Has lost about 20 lbs over the past several months.  Prior colonoscopy?  Labs showed increased LFTs and progressive renal failure.  No blood in stool.   Past Medical History:  Diagnosis Date  . Anxiety   . Depression   . Diabetes mellitus without complication ([place])   . Hypertension   . Peripheral neuropathy   . Renal disorder     Past Surgical History:  Procedure Laterality Date  . CARDIAC SURGERY    . CORONARY ARTERY BYPASS GRAFT      Prior to Admission medications   Medication Sig Start Date End Date Taking? Authorizing Provider  allopurinol (ZYLOPRIM) 100 MG tablet Take 1 tablet (100 mg total) by mouth daily. Patient taking differently: Take 100 mg by mouth every other day.  07/08/17  Yes Mercy Riding, MD  aspirin 81 MG tablet Take 1 tablet (81 mg total) by mouth daily. 03/01/13  Yes Fay Records, MD  atorvastatin (LIPITOR) 40 MG tablet Take 1 tablet (40 mg total) by mouth daily. 08/23/16  Yes Mercy Riding, MD  cloNIDine (CATAPRES - DOSED IN MG/24 HR) 0.1 mg/24hr patch Place 1 patch (0.1 mg total) onto the skin once a week. 03/12/17  Yes Mercy Riding, MD  escitalopram (LEXAPRO) 10 MG tablet Take 1 tablet (10 mg total) by mouth daily. 07/15/17  Yes Mercy Riding, MD  gabapentin (NEURONTIN) 300 MG capsule Take 1 capsule (300 mg total) by mouth 2 (two) times daily. 08/23/16  Yes Mercy Riding, MD  hydrALAZINE (APRESOLINE) 50 MG tablet Take 1 tablet (50 mg total) by mouth 3 (three) times daily. 04/02/17  Yes Mercy Riding, MD  Insulin Glargine (BASAGLAR KWIKPEN) 100 UNIT/ML SOPN Inject  0.2 mLs (20 Units total) into the skin every morning. 07/14/17  Yes Gonfa, Charlesetta Ivory, MD  liraglutide (VICTOZA) 18 MG/3ML SOPN Inject 1.2 mg once daily; if your blood glucose is greater than 140 mg/dL, may increase to 1.8 mg once daily. 07/08/17  Yes Mercy Riding, MD  NARCAN 4 MG/0.1ML LIQD nasal spray kit Place 1 spray into the nose once.  08/23/16  Yes [provider]  NIFEdipine (PROCARDIA XL/ADALAT-CC) 60 MG 24 hr tablet Take 1 tablet (60 mg total) by mouth daily. Patient taking differently: Take 60 mg by mouth 2 (two) times daily.  04/10/17  Yes Mercy Riding, MD  oxyCODONE-acetaminophen (ROXICET) 5-325 MG tablet Take 1 tablet by mouth every 8 (eight) hours as needed for severe pain. 07/24/17  Yes Mercy Riding, MD  polyethylene glycol (MIRALAX / GLYCOLAX) packet Take 17 g by mouth 2 (two) times daily. Patient taking differently: Take 17 g by mouth daily as needed.  08/28/16  Yes Mercy Riding, MD  sodium bicarbonate 650 MG tablet Take 650 mg by mouth 3 (three) times daily.   Yes [provider]  traZODone (DESYREL) 50 MG tablet Take 1 tablet (50 mg total) by mouth at bedtime as needed for sleep. 08/23/16  Yes Mercy Riding, MD    Current Facility-Administered Medications  Medication Dose Route Frequency Provider Last Rate Last Dose  .  0.9 %  sodium chloride infusion   Intravenous Once Wendee Beavers T, MD      . calcium acetate (PHOSLO) capsule 2,001 mg  2,001 mg Oral TID WC Estanislado Emms, MD      . cefTRIAXone (ROCEPHIN) 2 g in sodium chloride 0.9 % 100 mL IVPB  2 g Intravenous Q24H Mercy Riding, MD   Stopped at 07/30/17 2218  . insulin aspart (novoLOG) injection 0-9 Units  0-9 Units Subcutaneous TID WC Mercy Riding, MD   3 Units at 07/31/17 0834  . lidocaine (PF) (XYLOCAINE) 1 % injection           . MEDLINE mouth rinse  15 mL Mouth Rinse BID Dickie La, MD   15 mL at 07/31/17 0852  . ondansetron (ZOFRAN) injection 4 mg  4 mg Intravenous Q8H PRN Shirley, [place], DO   4 mg at  07/31/17 0118    Allergies as of 07/30/2017 - Review Complete 07/30/2017  Allergen Reaction Noted  . Penicillins Hives and Itching     Family History  Problem Relation Age of Onset  . Diabetes Father   . Heart attack Brother   . Heart disease Brother   . Diabetes Mother   . Heart disease Mother   . Congestive Heart Failure Mother   . Heart disease Brother   . Heart attack Brother     Social History   Socioeconomic History  . Marital status: Married    Spouse name: Not on file  . Number of children: Not on file  . Years of education: Not on file  . Highest education level: Not on file  Occupational History  . Occupation: Disabled/Retired  Social Needs  . Financial resource strain: Not on file  . Food insecurity:    Worry: Not on file    Inability: Not on file  . Transportation needs:    Medical: Not on file    Non-medical: Not on file  Tobacco Use  . Smoking status: Never Smoker  . Smokeless tobacco: Never Used  Substance and Sexual Activity  . Alcohol use: No  . Drug use: No  . Sexual activity: Yes  Lifestyle  . Physical activity:    Days per week: Not on file    Minutes per session: Not on file  . Stress: Not on file  Relationships  . Social connections:    Talks on phone: Not on file    Gets together: Not on file    Attends religious service: Not on file    Active member of club or organization: Not on file    Attends meetings of clubs or organizations: Not on file    Relationship status: Not on file  . Intimate partner violence:    Fear of current or ex partner: Not on file    Emotionally abused: Not on file    Physically abused: Not on file    Forced sexual activity: Not on file  Other Topics Concern  . Not on file  Social History Narrative   Pt lives in [place] with wife.  He  Has 13 children.  He is disabled from [place].      Current Social History 01/08/2017        Patient lives with wife Levada Dy in one level home 01/08/2017    Transportation: Patient drives moped with helmet and safety vest around [place]. 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 [place], RN, BSN                                                                                                  Review of Systems: As per HPI all others negative  Physical Exam: Vital signs in last 24 hours: Temp:  [97.5 F (36.4 C)-98.4 F (36.9 C)] 98.4 F (36.9 C) (05/09 1036) Pulse Rate:  [41-69] 68 (05/09 1200) Resp:  [15-22] 19 (05/09 1200) BP: (112-152)/(58-87) 137/73 (05/09 1137) SpO2:  [91 %-100 %] 95 % (05/09 1036) Weight:  [74.8 kg (165 lb)-81.3 kg (179 lb 3.7 oz)] 81.3 kg (179 lb 3.7 oz) (05/09 1036) Last BM Date: 07/30/17 General:   Somnolent but arouseable, overweight, well-nourished, pleasant and cooperative in NAD Head:  Normocephalic and atraumatic. Eyes:  Sclera clear, no icterus.   Conjunctiva pink. Ears:  Normal auditory acuity. Nose:  No deformity, discharge,  or lesions. Mouth:  No deformity or lesions.  Oropharynx pink & moist. Neck:  Supple; no masses or thyromegaly. Lungs:  Clear throughout to auscultation.   No wheezes, crackles, or rhonchi. No acute distress. Heart:  Regular rate and rhythm; no murmurs, clicks, rubs,  or gallops. Abdomen:  Soft, protuberant, nontender and mild distended. No masses, hepatosplenomegaly or hernias noted. Normal bowel sounds, without guarding, and without rebound.     Msk:  Symmetrical without gross deformities. Normal posture. Pulses:  Normal pulses noted. Extremities:  Without clubbing or edema. Neurologic:  Alert and  oriented x4;  grossly  normal neurologically. Skin:  Scattered ecchymoses, otherwise intact without significant lesions or rashes. Psych:  Somnolent but arousable   Lab Results: Recent Labs    07/30/17 1357 07/31/17 0629  WBC 14.4*  --   HGB 7.6* 8.1*  HCT 22.4* 23.9*  PLT 77*  --    BMET Recent Labs    07/30/17 1836 07/31/17 0629 07/31/17 0935  NA 133* 132* 131*  K 4.9 6.6* 6.8*  CL 91* 93* 93*  CO2 20* 17* 15*  GLUCOSE 228* 271* 256*  BUN 146* 159* 161*  CREATININE 9.73* 10.08* 10.24*  CALCIUM 7.2* 6.5* 6.2*   LFT Recent Labs    07/31/17 0629  PROT 6.1*  ALBUMIN 3.2*  AST 729*  ALT 2,366*  ALKPHOS 104  BILITOT 0.8   PT/INR Recent Labs    07/30/17 1838  LABPROT 19.9*  INR 1.71   Acute hepatitis panel negative  Studies/Results: Dg Chest 2 View  Result Date: 07/30/2017 CLINICAL DATA:  Abdominal pain and pressure. EXAM: CHEST - 2 VIEW COMPARISON:  12/29/2015 FINDINGS: Moderate cardiac enlargement status post prior CABG. Low bilateral lung volumes. There is no evidence of pulmonary edema, consolidation, pneumothorax, nodule or pleural fluid. The thoracic spine shows diffuse degenerative disc disease. IMPRESSION: Cardiac enlargement and low bilateral lung volumes. Electronically Signed  By: Aletta Edouard M.D.   On: 07/30/2017 15:28   US Abdomen Complete  Result Date: 07/30/2017 CLINICAL DATA:  Acute kidney injury, elevated LFTs. Hepatic steatosis. EXAM: ABDOMEN ULTRASOUND COMPLETE COMPARISON:  Ultrasound the abdomen limited 12/30/2015. FINDINGS: Gallbladder: No gallstones or wall thickening visualized. No sonographic Murphy sign noted by sonographer. Common bile duct: Diameter: Normal measuring 4.8 mm Liver: Dense echotexture. No focal lesions identified. Portal vein is patent on color Doppler imaging with normal direction of blood flow towards the liver. IVC: No abnormality visualized. Pancreas: Visualized portion unremarkable. Spleen: Size and appearance within normal limits. Right  Kidney: Length: 10.4 cm. Echogenicity within normal limits. No mass or hydronephrosis visualized. 2.5 cm cyst. Left Kidney: Length: 11.5 cm. Echogenicity within normal limits. No mass or hydronephrosis visualized. 2.0 cm cyst. Abdominal aorta: No aneurysm visualized. Other findings: Trace ascites. IMPRESSION: Unremarkable ultrasound of the abdomen. No acute intra-abdominal findings. Uncomplicated appearing renal cystic disease without hydronephrosis or solid mass. Hepatic steatosis.  No gallstones or biliary ductal dilatation. Electronically Signed   By: Staci Righter M.D.   On: 07/30/2017 23:35   Impression:  1.  Abdominal pain and abdominal distention; no significant ascites and no obvious cirrhotic liver morphology on recent ultrasound; no gallstones seen.  Could be from uremia, could be from liver etiology.  Had CT 2017 for pain/bloating, did show moderate rectosigmoid stool burden. 2.  Elevated LFTs.  Unclear etiology; could be non Hep A/B acute viral hepatitis versus ischemic hepatitis versus medication effect versus other. 3.  Progressive renal failure, now on dialysis.  Plan:  1.  Follow LFTs. 2.  If patient's renal function is felt to be permanently end-stage, I would consider CT scan abdomen/pelvis with contrast, specifically to assess for luminal (e.g., diverticulitis, ileus, enterocolitis) and extraluminal (especially hepatic, especially to assess hepatic vasculature) causes.  Will hold off on this study, however, until nephrology gives go-ahead for use of contrast (and, if some of his renal function is felt to be salvageable, then would not give IV contrast and would then consider non-contrast CT abd/pelvis in conjunction with abdominal ultrasound (this time with Doppler, which was not done on patient's most recent U/S study) for further evaluation. 3.  Check lipase. 4.  Dulcolax suppositories. 5.  [facility] will follow.   LOS: 1 day   Maelee Hoot M  07/31/2017, 12:39 PM  Cell  [phone] If no answer or after 5 PM call [phone]

## 2017-07-31 NOTE — Progress Notes (Signed)
1 Unit of blood transfused No reaction noted H & H labs ordered for 0600  Patient stable, resting

## 2017-07-31 NOTE — CV Procedure (Signed)
Attempted 2D Echo, patient not in room, will try again at a later time.  Joseph Mcdonald

## 2017-07-31 NOTE — Progress Notes (Signed)
Family Medicine Teaching Service Daily Progress Note Intern Pager: [phone]  Patient name: Joseph Mcdonald Medical record number: [id number] Date of birth: [birth year] Age: 67 y.o. Gender: male  Primary Care Provider: Mercy Riding, MD Consultants: nephro/cardio Code Status: full  Pt Overview and Major Events to Date:  5/8 admitted  Assessment and Plan: Joseph Mcdonald is a 67 y.o. male presenting with dyspnea and abdominal pain. PMH is significant for CAD status post CABG 2011, CKD 4, DM-2, hypertension, GERD, chronic back pain, secondary hyperparathyroidism, anemia of renal disease, gout, bradycardia.   Dyspnea: mild dyspnea on 4L am 5/9.  Patient has history of diastolic heart failure.  Last echo on 10/3 2017 with EF of 55 to 60% and G2DD. BNP over 4000 but in the setting of renal failure. He also denies orthopnea. CXR with cardiomegaly, but no significant pulmonary congestion.  Troponin 14.02 likely a combination of demand ischemia and acute renal failure. EKG with bradycardia,  junctional rhythm, RBBB, LAFB, nonspecific T abnormalities, lateral leads which is different from his prior ekg. Pneumonia is a possibility given leukocytosis with left shift but he has no cough or fever.  Procalcitonin 2.21 -Continue Lasix drip per nephrology recommendation -ACS rule out as below -GI consult in the morning -Echocardiogram pending -Oxygen as needed -Daily weights, I&O -CTX (5/9- )  Abdominal pain: abdomen distended and diffusely tender. Distention limits the exam. No significant ascites on my exam.  Given leukocytosis and left shift, concern for SBP.  He also have severe transaminitis. He has history of alcohol use with hepatic steatosis on previous ultrasound. -Abdominal US -Consider CT abdomen w/o contrast depending of [place]  -work up for transaminitis as below -Ceftriaxone  Transaminitis: ALT 3000, AST 1500.  Could be due to congestive hepatopathy but patient without  significant edema in his extremities.  Could be ischemic hepatitis or autoimmune. He has history of alcohol use disorder but sober for about a year. PT high 19.9, INR normal 1.71, tylenol <10.  Abdominal US without significant findings -Hepatitis panel pending -Consider autoimmune work-up -GI consulted -Ceftriaxone for possible intra-abdominal infection.  He has leukocytosis with left shift  High ferritin/iron overload: Ferritin >7,500, iron 230.  -will consult GI -considering abd/CT  CAD status post CABG in 2011.  Currently with dyspnea but no chest pain. Initial EKG with bradycardia, junctional rhythm, RBBB and LAFB which could be due to hyperkalemia.  Troponin 14.01.  Discussed with cardiology (Dr. Debara Mcdonald) who suggested cycling troponin and EKG and getting an Echo -Cycle troponin -Repeat EKG in the morning -Echocardiogram -cardiology consulted  Acute on chronic CKD-4: Serum creatinine trending upward to 10.08.  Baseline about 3.8.  BUN 159.  Patient with asterixis on exam.  Almost no urine output -Follow nephrology rec -IV Lasix as above -Renal ultrasound -FEUrea   Hyperkalemia: K6.3 am 5/9. on arrival.  Likely due to AKI.  S/p calcium gluconate, albuterol, insulin, dextrose and Kayexalate in ED.  Repeat K4.9. -Repeat BMP -gave 5u insulin  AGMA: Likely due to acute renal failure/uremia. -Trend BMP  Anemia of chronic disease: Hemoglobin 8.1 s/p 1 Unit transfusion.  Baseline 9-10.  Could be dilutional due to possible fluid overload as well. -Anemia panel -Daily CBC -s/p Transfuse 1 unit.  Hold 2 units.  Thrombocytopenia: Platelets 77.  Likely due to consumptive process versus production. -Daily CBC  DM2: Well controlled.  Last A1c 7.2 about a month ago.  On Lantus and Victoza at home. -SSI-thin -CBG ACHS  Hypertension: Normotensive.  On clonidine  patch, nifedipine and hydralazine -Consider discontinuing clonidine given bradycardia -Resume other home meds when  able  FEN/GI: -N.p.o. except for sips with meds  Prophylaxis:  -SCD given thrombocytopenia  Disposition: Admit to stepdown  Subjective:  Very uncomfortable but aox3.  Feels minimal improvement from overnight.  Kept eyes closed and was using short sentences.  Objective: Temp:  [97.5 F (36.4 C)-98.1 F (36.7 C)] 97.5 F (36.4 C) (05/09 0745) Pulse Rate:  [41-69] 51 (05/09 0800) Resp:  [15-22] 18 (05/09 0800) BP: (112-152)/(58-87) 142/63 (05/09 0800) SpO2:  [91 %-100 %] 95 % (05/09 0800) Weight:  [165 lb (74.8 kg)-177 lb 4 oz (80.4 kg)] 177 lb 4 oz (80.4 kg) (05/09 0500) Physical Exam: GEN: Lying in bed.  toxic appearing Head: normocephalic and atraumatic.  Face looks a little bit puffy Eyes: conjunctiva without injection. Sclera anicteric. CVS: Very difficult to auscultate with significant course breath sounds, 2+ edema on arms 1+ edema on legs but with SCDS very difficult to know if that represents fluid status.  Hard to appreciate JVD due to body habitus. RESP: 4L [place], course rhonchi throughout GI: BS present.  Firm and Distended and diffusely tender to palpation, patient and wife say less than last night but I can not verify.  Resonant to percussion.  Tender w/ no skin discoloration.   abdominal distention limits palpation for masses MSK: no focal tenderness or wounding noted SKIN: no apparent skin lesion NEURO: alert and oiented appropriately, no gross deficits.  Asterixis PSYCH: euthymic mood with congruent affect  Laboratory: Recent Labs  Lab 07/30/17 1357 07/31/17 0629  WBC 14.4*  --   HGB 7.6* 8.1*  HCT 22.4* 23.9*  PLT 77*  --    Recent Labs  Lab 07/30/17 1357 07/30/17 1836 07/31/17 0629  NA 130* 133* 132*  K 6.7* 4.9 6.6*  CL 91* 91* 93*  CO2 17* 20* 17*  BUN 140* 146* 159*  CREATININE 9.65* 9.73* 10.08*  CALCIUM 7.0* 7.2* 6.5*  PROT 6.4*  --  6.1*  BILITOT 1.2  --  0.8  ALKPHOS 94  --  104  ALT 2,996*  --  2,366*  AST 1,408*  --  729*  GLUCOSE  188* 228* 271*    Ferritin >7500  Imaging/Diagnostic Tests: Dg Chest 2 View  Result Date: 07/30/2017 CLINICAL DATA:  Abdominal pain and pressure. EXAM: CHEST - 2 VIEW COMPARISON:  12/29/2015 FINDINGS: Moderate cardiac enlargement status post prior CABG. Low bilateral lung volumes. There is no evidence of pulmonary edema, consolidation, pneumothorax, nodule or pleural fluid. The thoracic spine shows diffuse degenerative disc disease. IMPRESSION: Cardiac enlargement and low bilateral lung volumes. Electronically Signed   By: Aletta Edouard M.D.   On: 07/30/2017 15:28   US Abdomen Complete  Result Date: 07/30/2017 CLINICAL DATA:  Acute kidney injury, elevated LFTs. Hepatic steatosis. EXAM: ABDOMEN ULTRASOUND COMPLETE COMPARISON:  Ultrasound the abdomen limited 12/30/2015. FINDINGS: Gallbladder: No gallstones or wall thickening visualized. No sonographic Murphy sign noted by sonographer. Common bile duct: Diameter: Normal measuring 4.8 mm Liver: Dense echotexture. No focal lesions identified. Portal vein is patent on color Doppler imaging with normal direction of blood flow towards the liver. IVC: No abnormality visualized. Pancreas: Visualized portion unremarkable. Spleen: Size and appearance within normal limits. Right Kidney: Length: 10.4 cm. Echogenicity within normal limits. No mass or hydronephrosis visualized. 2.5 cm cyst. Left Kidney: Length: 11.5 cm. Echogenicity within normal limits. No mass or hydronephrosis visualized. 2.0 cm cyst. Abdominal aorta: No aneurysm visualized. Other findings: Trace  ascites. IMPRESSION: Unremarkable ultrasound of the abdomen. No acute intra-abdominal findings. Uncomplicated appearing renal cystic disease without hydronephrosis or solid mass. Hepatic steatosis.  No gallstones or biliary ductal dilatation. Electronically Signed   By: Staci Righter M.D.   On: 07/30/2017 23:35     Sherene Sires, DO 07/31/2017, 9:29 AM PGY-1, [place] Intern  pager: [phone], text pages welcome

## 2017-07-31 NOTE — Consult Note (Addendum)
Cardiology Consultation:   Patient ID: Rabon Scholle; [id number]; 19-Dec-1950   Admit date: 07/30/2017 Date of Consult: 07/31/2017  Primary Care Provider: Mercy Riding, MD Primary Cardiologist: Dorris Carnes, MD  Primary Electrophysiologist:  NA   Patient Profile:   Ithiel Liebler is a 67 y.o. 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 Nori Riis.  History of Present Illness:   Mr. Depass has a hx of CABG 2011 (LIMA->LAD; VG->ramus,OM1; VG->PDA, PLSA), CKD 4, HTN, DM, obesity, with nephrologist in [place] admitted 07/30/17 for dyspnea and abd pain.   He was sick over the weekend with vomiting, last episode Monday AM, also poor appetite, glucose was elevated to about 500.  He denied chest pain.   Prior echo 2017 with EF 55-60%, G2DD--there was no ischemia on nuc in 2017.  EF was low but nuc is not accurate way to eval and echo with normal EF.    EKG on admit with SB though difficult to see P waves at times. T wave inversions inf. Lateral.  these wave inversions  New from 08/2016.  I personally reviewed  Follow up EKGs due appear junctional rhythm last evening   Tele I personally reviewed, WCT 7 beats  Now SR to SB   On admit his CR was 9.65, K+ 6.7, BUN 140, AST 1408, ALT 2996 Cr in 04/2017 was 3.83 T bili 6.4 BNP 4146 Troponin 14.02; 14.06; 11.58; 11.75  HGB 7.6, HCT 22, plts 77  2V CXR with cardiac enlargement and low bilateral lung volumes  Nephrology has seen for AKI - dialysis recommended GI has seen   Today K+ 6.8, Cr 10.24  LFTs are slowly decreasing  He is negative 1425   Currently wakes when name called several times.  No chest pain, and has not had any.     Past Medical History:  Diagnosis Date  . Anxiety   . Depression   . Diabetes mellitus without  complication ([place])   . Hypertension   . Peripheral neuropathy   . Renal disorder     Past Surgical History:  Procedure Laterality Date  . CARDIAC SURGERY    . CORONARY ARTERY BYPASS GRAFT       Home Medications:  Prior to Admission medications   Medication Sig Start Date End Date Taking? Authorizing Provider  allopurinol (ZYLOPRIM) 100 MG tablet Take 1 tablet (100 mg total) by mouth daily. Patient taking differently: Take 100 mg by mouth every other day.  07/08/17  Yes Mercy Riding, MD  aspirin 81 MG tablet Take 1 tablet (81 mg total) by mouth daily. 03/01/13  Yes Fay Records, MD  atorvastatin (LIPITOR) 40 MG tablet Take 1 tablet (40 mg total) by mouth daily. 08/23/16  Yes Mercy Riding, MD  cloNIDine (CATAPRES - DOSED IN MG/24 HR) 0.1 mg/24hr patch Place 1 patch (0.1 mg total) onto the skin once a week. 03/12/17  Yes Mercy Riding, MD  escitalopram (LEXAPRO) 10 MG tablet Take 1 tablet (10 mg total) by mouth daily. 07/15/17  Yes Mercy Riding, MD  gabapentin (NEURONTIN) 300 MG capsule Take 1 capsule (300 mg total) by mouth 2 (two) times daily. 08/23/16  Yes Mercy Riding, MD  hydrALAZINE (APRESOLINE) 50 MG tablet Take 1 tablet (50 mg total) by mouth 3 (  three) times daily. 04/02/17  Yes Mercy Riding, MD  Insulin Glargine (BASAGLAR KWIKPEN) 100 UNIT/ML SOPN Inject 0.2 mLs (20 Units total) into the skin every morning. 07/14/17  Yes Gonfa, Charlesetta Ivory, MD  liraglutide (VICTOZA) 18 MG/3ML SOPN Inject 1.2 mg once daily; if your blood glucose is greater than 140 mg/dL, may increase to 1.8 mg once daily. 07/08/17  Yes Mercy Riding, MD  NARCAN 4 MG/0.1ML LIQD nasal spray kit Place 1 spray into the nose once.  08/23/16  Yes [provider]  NIFEdipine (PROCARDIA XL/ADALAT-CC) 60 MG 24 hr tablet Take 1 tablet (60 mg total) by mouth daily. Patient taking differently: Take 60 mg by mouth 2 (two) times daily.  04/10/17  Yes Mercy Riding, MD  oxyCODONE-acetaminophen (ROXICET) 5-325 MG tablet Take 1 tablet  by mouth every 8 (eight) hours as needed for severe pain. 07/24/17  Yes Mercy Riding, MD  polyethylene glycol (MIRALAX / GLYCOLAX) packet Take 17 g by mouth 2 (two) times daily. Patient taking differently: Take 17 g by mouth daily as needed.  08/28/16  Yes Mercy Riding, MD  sodium bicarbonate 650 MG tablet Take 650 mg by mouth 3 (three) times daily.   Yes [provider]  traZODone (DESYREL) 50 MG tablet Take 1 tablet (50 mg total) by mouth at bedtime as needed for sleep. 08/23/16  Yes Mercy Riding, MD    Inpatient Medications: Scheduled Meds: . bisacodyl  10 mg Rectal q morning - 10a  . calcium acetate  2,001 mg Oral TID WC  . insulin aspart  0-9 Units Subcutaneous TID WC  . lidocaine (PF)      . mouth rinse  15 mL Mouth Rinse BID   Continuous Infusions: . sodium chloride    . cefTRIAXone (ROCEPHIN)  IV Stopped (07/30/17 2218)   PRN Meds: ondansetron (ZOFRAN) IV  Allergies:    Allergies  Allergen Reactions  . Penicillins Hives and Itching    Has patient had a PCN reaction causing immediate rash, facial/tongue/throat swelling, SOB or lightheadedness with hypotension: Yes Has patient had a PCN reaction causing severe rash involving mucus membranes or skin necrosis: Yes Has patient had a PCN reaction that required hospitalization No Has patient had a PCN reaction occurring within the last 10 years: No If all of the above answers are "NO", then may proceed with Cephalosporin use.     Social History:   Social History   Socioeconomic History  . Marital status: Married    Spouse name: Not on file  . Number of children: Not on file  . Years of education: Not on file  . Highest education level: Not on file  Occupational History  . Occupation: Disabled/Retired  Social Needs  . Financial resource strain: Not on file  . Food insecurity:    Worry: Not on file    Inability: Not on file  . Transportation needs:    Medical: Not on file    Non-medical: Not on file  Tobacco  Use  . Smoking status: Never Smoker  . Smokeless tobacco: Never Used  Substance and Sexual Activity  . Alcohol use: No  . Drug use: No  . Sexual activity: Yes  Lifestyle  . Physical activity:    Days per week: Not on file    Minutes per session: Not on file  . Stress: Not on file  Relationships  . Social connections:    Talks on phone: Not on file    Gets together:  Not on file    Attends religious service: Not on file    Active member of club or organization: Not on file    Attends meetings of clubs or organizations: Not on file    Relationship status: Not on file  . Intimate partner violence:    Fear of current or ex partner: Not on file    Emotionally abused: Not on file    Physically abused: Not on file    Forced sexual activity: Not on file  Other Topics Concern  . Not on file  Social History Narrative   Pt lives in [place] with wife.  He  Has 13 children.  He is disabled from [place].      Current Social History 01/08/2017        Patient lives with wife Levada Dy in one level home 01/08/2017   Transportation: Patient drives moped with helmet and safety vest around [place]. Daughter drives him to appts in [facility] 01/08/2017   Important Relationships "My family" 01/08/2017    Pets: None 01/08/2017   Education / Work:  7 th grade/ Has part-time job raking leaves and weed-eating around [place] 01/08/2017   Interests / Fun: Watching sports (football and racing), fishing 01/08/2017   Current Stressors: None 01/08/2017   Religious / Personal Beliefs: [employer] 01/08/2017   L. Ducatte, RN, BSN                                                                                                  Family History:    Family History  Problem Relation Age of Onset  . Diabetes Father   . Heart attack Brother   . Heart disease Brother   . Diabetes Mother   . Heart disease Mother   . Congestive Heart Failure Mother   . Heart disease Brother   . Heart attack Brother      ROS:    Please see the history of present illness. Pt not answering questions rapidly  Per H&P He does state he feels better.  General:no colds or fevers, no weight changes Skin:no rashes or ulcers HEENT:no blurred vision, no congestion CV:see HPI PUL:see HPI GI:no diarrhea constipation or melena, no indigestion GU:no hematuria, no dysuria MS:no joint pain, no claudication Neuro:no syncope, no lightheadedness Endo:+ diabetes, no thyroid disease  All other ROS reviewed and negative.     Physical Exam/Data:   Vitals:   07/31/17 1230 07/31/17 1300 07/31/17 1330 07/31/17 1342  BP: (!) 146/83 (!) 142/71 139/78 (!) 156/84  Pulse: 70 78 77 80  Resp: _0 Temp:      TempSrc:    Oral  SpO2:    93%  Weight:      Height:        Intake/Output Summary (Last 24 hours) at 07/31/2017 1425 Last data filed at 07/31/2017 1342 Gross per 24 hour  Intake 505 ml  Output 1930 ml  Net -1425 ml   Filed Weights   07/31/17 0012 07/31/17 0500 07/31/17 1036  Weight: 177 lb 4 oz (80.4 kg) 177 lb 4 oz (80.4 kg) 179 lb  3.7 oz (81.3 kg)   Body mass index is 30.77 kg/m.  General:  Well nourished, well developed, in no acute distress,lying mostly flat without SOB HEENT: normal Lymph: no adenopathy Neck: no JVD Endocrine:  No thryomegaly Vascular: No carotid bruits; pedal pulses 2+ bilaterally   Cardiac:  normal S1, S2; RRR; no murmur, gallup, rub or click   Lungs:  clear to auscultation bilaterally, no wheezing, rhonchi or rales  Abd: soft, nontender, no hepatomegaly  Ext: no edema Musculoskeletal:  No deformities, BUE and BLE strength normal and equal Skin: warm and dry  Neuro:  Wakes briefly and answers questions appropriately , no focal abnormalities noted Psych:  Normal affect    Relevant CV Studies: Echo 12/2015  Study Conclusions  - Left ventricle: The cavity size was mildly dilated. There was   moderate concentric hypertrophy. Systolic function was normal.   The estimated ejection  fraction was in the range of 55% to 60%.   Wall motion was normal; there were no regional wall motion   abnormalities. Features are consistent with a pseudonormal left   ventricular filling pattern, with concomitant abnormal relaxation   and increased filling pressure (grade 2 diastolic dysfunction). - Aortic valve: Trileaflet; mildly thickened, mildly calcified   leaflets. Valve area (VTI): 1.83 cm^2. Valve area (Vmax): 1.84   cm^2. Valve area (Vmean): 1.86 cm^2. - Left atrium: The atrium was moderately dilated.   Nuc study 12/2015   There was no ST segment deviation noted during stress.  This is an intermediate risk study due to reduced systolic function.  The left ventricular ejection fraction is calculated as moderately decreased (30-44%) but appears normal visually.  No ischemia.    Current Echo being done.  Laboratory Data:  Chemistry Recent Labs  Lab 07/30/17 1836 07/31/17 0629 07/31/17 0935  NA 133* 132* 131*  K 4.9 6.6* 6.8*  CL 91* 93* 93*  CO2 20* 17* 15*  GLUCOSE 228* 271* 256*  BUN 146* 159* 161*  CREATININE 9.73* 10.08* 10.24*  CALCIUM 7.2* 6.5* 6.2*  GFRNONAA 5* 5* 5*  GFRAA 6* 5* 5*  ANIONGAP 22* 22* 23*    Recent Labs  Lab 07/30/17 1357 07/30/17 1836 07/31/17 0629  PROT 6.4*  --  6.1*  ALBUMIN 3.4* 3.4* 3.2*  AST 1,408*  --  729*  ALT 2,996*  --  2,366*  ALKPHOS 94  --  104  BILITOT 1.2  --  0.8   Hematology Recent Labs  Lab 07/30/17 1357 07/30/17 1838 07/31/17 0629  WBC 14.4*  --   --   RBC 2.52* 2.44*  --   HGB 7.6*  --  8.1*  HCT 22.4*  --  23.9*  MCV 88.9  --   --   MCH 30.2  --   --   MCHC 33.9  --   --   RDW 13.8  --   --   PLT 77*  --   --    Cardiac Enzymes Recent Labs  Lab 07/30/17 1357 07/30/17 1836 07/30/17 2334 07/31/17 0935  TROPONINI 14.02* 14.06* 11.58* 11.75*   No results for input(s): TROPIPOC in the last 168 hours.  BNP Recent Labs  Lab 07/30/17 1358  BNP 4,146.3*    DDimer No results for  input(s): DDIMER in the last 168 hours.  Radiology/Studies:  Dg Chest 2 View  Result Date: 07/30/2017 CLINICAL DATA:  Abdominal pain and pressure. EXAM: CHEST - 2 VIEW COMPARISON:  12/29/2015 FINDINGS: Moderate cardiac enlargement status post prior CABG.  Low bilateral lung volumes. There is no evidence of pulmonary edema, consolidation, pneumothorax, nodule or pleural fluid. The thoracic spine shows diffuse degenerative disc disease. IMPRESSION: Cardiac enlargement and low bilateral lung volumes. Electronically Signed   By: Aletta Edouard M.D.   On: 07/30/2017 15:28   US Abdomen Complete  Result Date: 07/30/2017 CLINICAL DATA:  Acute kidney injury, elevated LFTs. Hepatic steatosis. EXAM: ABDOMEN ULTRASOUND COMPLETE COMPARISON:  Ultrasound the abdomen limited 12/30/2015. FINDINGS: Gallbladder: No gallstones or wall thickening visualized. No sonographic Murphy sign noted by sonographer. Common bile duct: Diameter: Normal measuring 4.8 mm Liver: Dense echotexture. No focal lesions identified. Portal vein is patent on color Doppler imaging with normal direction of blood flow towards the liver. IVC: No abnormality visualized. Pancreas: Visualized portion unremarkable. Spleen: Size and appearance within normal limits. Right Kidney: Length: 10.4 cm. Echogenicity within normal limits. No mass or hydronephrosis visualized. 2.5 cm cyst. Left Kidney: Length: 11.5 cm. Echogenicity within normal limits. No mass or hydronephrosis visualized. 2.0 cm cyst. Abdominal aorta: No aneurysm visualized. Other findings: Trace ascites. IMPRESSION: Unremarkable ultrasound of the abdomen. No acute intra-abdominal findings. Uncomplicated appearing renal cystic disease without hydronephrosis or solid mass. Hepatic steatosis.  No gallstones or biliary ductal dilatation. Electronically Signed   By: Staci Righter M.D.   On: 07/30/2017 23:35    Assessment and Plan:   1. Elevated troponin at 14 and abnormal EKG with infant t wave  inversions - concerning with his hx of CABG.  Will see if echo has any wall motion abnormalities.  This could all be related to AKI and hyperkalemia.  No chest pain.  Plans are for dialysis.  Dr. Acie Fredrickson to see.  -- Echo pending further plan per results.  2.         AKI though may be progression of his chronic renal failure.  Plan for dialysis - nephrology has seen.  Dialysis cath placed --on lasix 200 mg once yesterday   3.          Elevated LFTs amy be volume overload in acute renal failure. Slowly improving.  GI is following  --PT was elevated 19.9 with INR of 1.71  4.          HTN 161/81, 136/75   5.          Hyperkalemia per primary team and Renal, has rec'd kayexalate   6.           CAD with hx CABG 2011 last nuc 2017 (done for abnormal EKG and troponin of 5) neg for ischemia, will eval echo for further recommendations.   7.            Thrombocytopenia with plts at 77 K   8.            DM per PCP   For questions or updates, please contact [place] Please consult [email or web address] for contact info under Cardiology/STEMI.   Signed, Cecilie Kicks, NP  07/31/2017 2:25 PM  Attending Note:   The patient was seen and examined.  Agree with assessment and plan as noted above.  Changes made to the above note as needed.  Patient seen and independently examined with Cecilie Kicks, NP .   We discussed all aspects of the encounter. I agree with the assessment and plan as stated above.  1.  Coronary artery disease: The patient has a history of coronary artery disease and coronary artery bypass grafting.  Now presents with acute renal failure and elevated troponin levels. Preliminary  review of his echocardiogram reveals an ejection fraction of around 35%.  His previous ejection fraction was listed as being normal.  He still needs several additional days of dialysis.  He had his he had his first dialysis session today. He has T wave inversions in the anterior and lateral leads.  His wife  states that he has had some intermittent episodes of chest discomfort over the past week but none as severe as as he did prior to his bypass surgery.  I think that we will likely need to proceed with heart catheterization sometime next week after he improves from a renal failure standpoint.  2.  Acute combined systolic and diastolic congestive heart failure: His ejection fraction is around 35% by echo today.  This may improve with further dialysis sessions.  We will need to consider starting him on standard heart failure medications.  3.  Essential hypertension: This should improve with dialysis.   I have spent a total of 40 minutes with patient reviewing hospital  notes , telemetry, EKGs, labs and examining patient as well as establishing an assessment and plan that was discussed with the patient. > 50% of time was spent in direct patient care.    Thayer Headings, Brooke Bonito., MD, [facility] 07/31/2017, 3:53 PM [address]  [place] Pager [phone]

## 2017-07-31 NOTE — Progress Notes (Signed)
CRITICAL VALUE ALERT  Critical Value:  K 6.6  Date & Time Notied:  07/31/17 0736   Provider Notified: Dr. Nori Riis at bedside  Orders Received/Actions taken: new orders received

## 2017-07-31 NOTE — Consult Note (Addendum)
CONSULT NOTE   MRN : [id number]  Reason for Consult: ESRD acute on CKD Referring Physician: Dr. Justin Mend  History of Present Illness:   Joseph Mcdonald is a 67 y.o. (02-18-1951) male was brought to the ED at the urgency of his wife.  He stated having symptoms this past Saturday of SOB, poor balance, abdominal pain and vomiting.   History was obtained from wife as pt is lethargic with altered mental status.  Pt reported develop hyperkalemia requiring temporary dialysis catheter placement in R femoral vein by Dr. Justin Mend.    Multiple active medical problems include: CAD, Acute on CKD Stage IV, Hyperkalemia, Thrombocytopenia, DM, respiratory failure, abdominal pain, transaminitis  Past Medical History:  Diagnosis Date  . Anxiety   . Depression   . Diabetes mellitus without complication ([place])   . Hypertension   . Peripheral neuropathy   . Renal disorder     Past Surgical History:  Procedure Laterality Date  . CARDIAC SURGERY    . CORONARY ARTERY BYPASS GRAFT      Social History   Socioeconomic History  . Marital status: Married    Spouse name: Not on file  . Number of children: Not on file  . Years of education: Not on file  . Highest education level: Not on file  Occupational History  . Occupation: Disabled/Retired  Social Needs  . Financial resource strain: Not on file  . Food insecurity:    Worry: Not on file    Inability: Not on file  . Transportation needs:    Medical: Not on file    Non-medical: Not on file  Tobacco Use  . Smoking status: Never Smoker  . Smokeless tobacco: Never Used  Substance and Sexual Activity  . Alcohol use: No  . Drug use: No  . Sexual activity: Yes  Lifestyle  . Physical activity:    Days per week: Not on file    Minutes per session: Not on file  . Stress: Not on file  Relationships  . Social connections:    Talks on phone: Not on file    Gets together: Not on file    Attends religious service: Not on file    Active  member of club or organization: Not on file    Attends meetings of clubs or organizations: Not on file    Relationship status: Not on file  . Intimate partner violence:    Fear of current or ex partner: Not on file    Emotionally abused: Not on file    Physically abused: Not on file    Forced sexual activity: Not on file  Other Topics Concern  . Not on file  Social History Narrative   Pt lives in [place] with wife.  He  Has 13 children.  He is disabled from [place].      Current Social History 01/08/2017        Patient lives with wife Levada Dy in one level home 01/08/2017   Transportation: Patient drives moped with helmet and safety vest around [place]. Daughter drives him to appts in [facility] 01/08/2017   Important Relationships "My family" 01/08/2017    Pets: None 01/08/2017   Education / Work:  7 th grade/ Has part-time job raking leaves and weed-eating around [place] 01/08/2017   Interests / Fun: Watching sports (football and racing), fishing 01/08/2017   Current Stressors: None 01/08/2017   Religious / Personal Beliefs: [employer] 01/08/2017   L. Silvano Rusk, RN, BSN  Family History  Problem Relation Age of Onset  . Diabetes Father   . Heart attack Brother   . Heart disease Brother   . Diabetes Mother   . Heart disease Mother   . Congestive Heart Failure Mother   . Heart disease Brother   . Heart attack Brother     Current Facility-Administered Medications  Medication Dose Route Frequency Provider Last Rate Last Dose  . 0.9 %  sodium chloride infusion   Intravenous Once Gonfa, Taye T, MD      . bisacodyl (DULCOLAX) suppository 10 mg  10 mg Rectal q morning - 10a Arta Silence, MD   10 mg at 07/31/17 1633  . calcium acetate (PHOSLO) capsule 2,001 mg  2,001 mg Oral TID WC Estanislado Emms, MD      . cefTRIAXone (ROCEPHIN) 2 g in sodium chloride 0.9 % 100 mL IVPB  2 g Intravenous  Q24H Mercy Riding, MD   Stopped at 07/30/17 2218  . insulin aspart (novoLOG) injection 0-9 Units  0-9 Units Subcutaneous TID WC Mercy Riding, MD   3 Units at 07/31/17 0834  . lidocaine (PF) (XYLOCAINE) 1 % injection           . MEDLINE mouth rinse  15 mL Mouth Rinse BID Dickie La, MD   15 mL at 07/31/17 0852  . ondansetron (ZOFRAN) injection 4 mg  4 mg Intravenous Q8H PRN Shirley, [place], DO   4 mg at 07/31/17 0118     Allergies  Allergen Reactions  . Penicillins Hives and Itching    Has patient had a PCN reaction causing immediate rash, facial/tongue/throat swelling, SOB or lightheadedness with hypotension: Yes Has patient had a PCN reaction causing severe rash involving mucus membranes or skin necrosis: Yes Has patient had a PCN reaction that required hospitalization No Has patient had a PCN reaction occurring within the last 10 years: No If all of the above answers are "NO", then may proceed with Cephalosporin use.     REVIEW OF SYSTEMS (negative unless checked):   Cardiac:  '[x]'  Chest pain or chest pressure? '[x]'  Shortness of breath upon activity? '[x]'  Shortness of breath when lying flat? '[]'  Irregular heart rhythm?  Vascular:  '[]'  Pain in calf, thigh, or hip brought on by walking? '[]'  Pain in feet at night that wakes you up from your sleep? '[]'  Blood clot in your veins? '[x]'  Leg swelling?  Pulmonary:  '[]'  Oxygen at home? '[x]'  Productive cough? '[]'  Wheezing?  Neurologic:  '[x]'  Sudden weakness in arms or legs? '[x]'  Sudden numbness in arms or legs? '[]'  Sudden onset of difficult speaking or slurred speech? '[]'  Temporary loss of vision in one eye? '[]'  Problems with dizziness?  Gastrointestinal:  '[]'  Blood in stool? '[]'  Vomited blood?  Genitourinary:  '[]'  Burning when urinating? '[]'  Blood in urine?  Psychiatric:  '[]'  Major depression  Hematologic:  '[]'  Bleeding problems? '[]'  Problems with blood clotting?  Dermatologic:  '[]'  Rashes or ulcers?  Constitutional:  '[]'  Fever or  chills?  Ear/Nose/Throat:  '[]'  Change in hearing? '[]'  Nose bleeds? '[]'  Sore throat?  Musculoskeletal:  '[x]'  Back pain? '[]'  Joint pain? '[]'  Muscle pain?   Physical Examination Vitals:   07/31/17 1230 07/31/17 1300 07/31/17 1330 07/31/17 1342  BP: (!) 146/83 (!) 142/71 139/78 (!) 156/84  Pulse: 70 78 77 80  Resp: '13 12 11 12  ' Temp:      TempSrc:    Oral  SpO2:    93%  Weight:  Height:       Body mass index is 30.77 kg/m.  General Somulent, toxicl appearing  Head Emlyn/AT,    Ear/Nose/ Throat Could not test due to poor cooperation, nares without erythema or drainage,   Eyes PERRL, was not tracking face,   Neck Supple, mid-line trachea,    Pulmonary Sym exp, good B air movt, rales throughout both side  Cardiac RRR, Nl S1, S2, no Murmurs, No rubs, Gallop present: +S3  Vascular Vessel Right Left  Radial Faintly palpable Faintly palpable  Brachial Palpable Palpable  Carotid Palpable, No Bruit Palpable, No Bruit  Aorta Not palpable N/A  Femoral Palpable Palpable  Popliteal Not palpable Not palpable  PT Not palpable Not palpable  DP Not palpable Not palpable    Gastro- intestinal soft, non-distended, non-tender to palpation, No guarding or rebound, no HSM, no masses, no CVAT B, No palpable prominent aortic pulse,    Musculo- skeletal Not able to test due to altered mental status, no obvious gangrene or ulcers  Neurologic Not able to test due to altered mental status  Psychiatric Not able to test due to altered mental status  Dermatologic See M/S exam for extremity exam,   Lymphatic  Palpable lymph nodes: None   Laboratory   CBC CBC Latest Ref Rng & Units 07/31/2017 07/30/2017 09/01/2016  WBC 4.0 - 10.5 K/uL - 14.4(H) 7.4  Hemoglobin 13.0 - 17.0 g/dL 8.1(L) 7.6(L) 10.9(L)  Hematocrit 39.0 - 52.0 % 23.9(L) 22.4(L) 31.8(L)  Platelets 150 - 400 K/uL - 77(L) 268    BMP BMP Latest Ref Rng & Units 07/31/2017 07/31/2017 07/30/2017  Glucose 65 - 99 mg/dL 256(H) 271(H) 228(H)  BUN 6  - 20 mg/dL 161(H) 159(H) 146(H)  Creatinine 0.61 - 1.24 mg/dL 10.24(H) 10.08(H) 9.73(H)  BUN/Creat Ratio 10 - 24 - - -  Sodium 135 - 145 mmol/L 131(L) 132(L) 133(L)  Potassium 3.5 - 5.1 mmol/L 6.8(HH) 6.6(HH) 4.9  Chloride 101 - 111 mmol/L 93(L) 93(L) 91(L)  CO2 22 - 32 mmol/L 15(L) 17(L) 20(L)  Calcium 8.9 - 10.3 mg/dL 6.2(LL) 6.5(L) 7.2(L)   Hepatic Function Latest Ref Rng & Units 07/31/2017 07/30/2017 07/30/2017  Total Protein 6.5 - 8.1 g/dL 6.1(L) - 6.4(L)  Albumin 3.5 - 5.0 g/dL 3.2(L) 3.4(L) 3.4(L)  AST 15 - 41 U/L 729(H) - 1,408(H)  ALT 17 - 63 U/L 2,366(H) - 2,996(H)  Alk Phosphatase 38 - 126 U/L 104 - 94  Total Bilirubin 0.3 - 1.2 mg/dL 0.8 - 1.2    Coagulation Lab Results  Component Value Date   INR 1.71 07/30/2017   INR 1.17 12/28/2015   INR 1.18 12/26/2015   No results found for: PTT  Lipids    Component Value Date/Time   CHOL 180 12/25/2015 1429   TRIG 255 (H) 12/25/2015 1429   HDL 48 12/25/2015 1429   CHOLHDL 3.8 12/25/2015 1429   VLDL 51 (H) 12/25/2015 1429   LDLCALC 81 12/25/2015 1429    Radiology     Dg Chest 2 View  Result Date: 07/30/2017 CLINICAL DATA:  Abdominal pain and pressure. EXAM: CHEST - 2 VIEW COMPARISON:  12/29/2015 FINDINGS: Moderate cardiac enlargement status post prior CABG. Low bilateral lung volumes. There is no evidence of pulmonary edema, consolidation, pneumothorax, nodule or pleural fluid. The thoracic spine shows diffuse degenerative disc disease. IMPRESSION: Cardiac enlargement and low bilateral lung volumes. Electronically Signed   By: Aletta Edouard M.D.   On: 07/30/2017 15:28   US Abdomen Complete  Result Date: 07/30/2017  CLINICAL DATA:  Acute kidney injury, elevated LFTs. Hepatic steatosis. EXAM: ABDOMEN ULTRASOUND COMPLETE COMPARISON:  Ultrasound the abdomen limited 12/30/2015. FINDINGS: Gallbladder: No gallstones or wall thickening visualized. No sonographic Murphy sign noted by sonographer. Common bile duct: Diameter: Normal  measuring 4.8 mm Liver: Dense echotexture. No focal lesions identified. Portal vein is patent on color Doppler imaging with normal direction of blood flow towards the liver. IVC: No abnormality visualized. Pancreas: Visualized portion unremarkable. Spleen: Size and appearance within normal limits. Right Kidney: Length: 10.4 cm. Echogenicity within normal limits. No mass or hydronephrosis visualized. 2.5 cm cyst. Left Kidney: Length: 11.5 cm. Echogenicity within normal limits. No mass or hydronephrosis visualized. 2.0 cm cyst. Abdominal aorta: No aneurysm visualized. Other findings: Trace ascites. IMPRESSION: Unremarkable ultrasound of the abdomen. No acute intra-abdominal findings. Uncomplicated appearing renal cystic disease without hydronephrosis or solid mass. Hepatic steatosis.  No gallstones or biliary ductal dilatation. Electronically Signed   By: Staci Righter M.D.   On: 07/30/2017 23:35   CXR suggest ATX as etiology of rales on exam.    Non-Invasive Vascular Imaging: Pending vein mapping  ASSESSMENT/PLAN:  1. Acute on CKD Stage IV,  2. CAD,  3. Hyperkalemia,  4. Thrombocytopenia,  5. DM,  6. Abdominal pain,  7. Transaminitis  Patient has started HD via right femoral catheter placed by Dr. Justin Mend today.  He is right hand dominant.  I explained the difference between fistula and graft to his wife.  She returned understanding.    Roxy Horseman 07/31/2017 2:16 PM   Addendum  I have independently interviewed and examined the patient in the presence of his family, and I agree with the physician assistant's findings.  This patient is NOT medically stable.  He has multiple active medical issues that will require further stabilization before even consideration of access placement.     Per Dr. Acie Fredrickson, Cardiology is planning on cardiac cath next week.  This essentially guarantees Anesthesia will not clear this patient for the OR.  I will check on the patient on Monday.  I suspect he is  unlikely to be ready by then.   Adele Barthel, MD, FACS [facility] Office: [phone] Pager: [phone]  07/31/2017, 6:58 PM

## 2017-07-31 NOTE — Progress Notes (Signed)
Referring Provider: No ref. provider found Primary Care Physician:  Mercy Riding, MD Primary Nephrologist:    Reason for Consultation:     HPI: 67 y.o. male presenting with dyspnea and abdominal pain. PMH is significant for CAD status post CABG 2011, CKD 4, DM-2, hypertension, GERD, chronic back pain, secondary hyperparathyroidism, anemia of renal disease, gout, bradycardia. He has been followed by nephrology and has a baseline creatinine of about 3.8  This has increased to about 10 with a K that has increased to above 6  He was admitted with abdominal pain and dyspnea and a GI evaluation has been asked for. He has increased in his liver enzymes and an abdominal ultrasound has been requested. He has had a CABG in the past and cardiology has been asked to evaluate the patient.     Past Medical History:  Diagnosis Date  . Anxiety   . Depression   . Diabetes mellitus without complication ([place])   . Hypertension   . Peripheral neuropathy   . Renal disorder     Past Surgical History:  Procedure Laterality Date  . CARDIAC SURGERY    . CORONARY ARTERY BYPASS GRAFT      Prior to Admission medications   Medication Sig Start Date End Date Taking? Authorizing Provider  allopurinol (ZYLOPRIM) 100 MG tablet Take 1 tablet (100 mg total) by mouth daily. Patient taking differently: Take 100 mg by mouth every other day.  07/08/17  Yes Mercy Riding, MD  aspirin 81 MG tablet Take 1 tablet (81 mg total) by mouth daily. 03/01/13  Yes Fay Records, MD  atorvastatin (LIPITOR) 40 MG tablet Take 1 tablet (40 mg total) by mouth daily. 08/23/16  Yes Mercy Riding, MD  cloNIDine (CATAPRES - DOSED IN MG/24 HR) 0.1 mg/24hr patch Place 1 patch (0.1 mg total) onto the skin once a week. 03/12/17  Yes Mercy Riding, MD  escitalopram (LEXAPRO) 10 MG tablet Take 1 tablet (10 mg total) by mouth daily. 07/15/17  Yes Mercy Riding, MD  gabapentin (NEURONTIN) 300 MG capsule Take 1 capsule (300 mg total) by mouth 2 (two)  times daily. 08/23/16  Yes Mercy Riding, MD  hydrALAZINE (APRESOLINE) 50 MG tablet Take 1 tablet (50 mg total) by mouth 3 (three) times daily. 04/02/17  Yes Mercy Riding, MD  Insulin Glargine (BASAGLAR KWIKPEN) 100 UNIT/ML SOPN Inject 0.2 mLs (20 Units total) into the skin every morning. 07/14/17  Yes Gonfa, Charlesetta Ivory, MD  liraglutide (VICTOZA) 18 MG/3ML SOPN Inject 1.2 mg once daily; if your blood glucose is greater than 140 mg/dL, may increase to 1.8 mg once daily. 07/08/17  Yes Mercy Riding, MD  NARCAN 4 MG/0.1ML LIQD nasal spray kit Place 1 spray into the nose once.  08/23/16  Yes [provider]  NIFEdipine (PROCARDIA XL/ADALAT-CC) 60 MG 24 hr tablet Take 1 tablet (60 mg total) by mouth daily. Patient taking differently: Take 60 mg by mouth 2 (two) times daily.  04/10/17  Yes Mercy Riding, MD  oxyCODONE-acetaminophen (ROXICET) 5-325 MG tablet Take 1 tablet by mouth every 8 (eight) hours as needed for severe pain. 07/24/17  Yes Mercy Riding, MD  polyethylene glycol (MIRALAX / GLYCOLAX) packet Take 17 g by mouth 2 (two) times daily. Patient taking differently: Take 17 g by mouth daily as needed.  08/28/16  Yes Mercy Riding, MD  sodium bicarbonate 650 MG tablet Take 650 mg by mouth 3 (three) times daily.  Yes [provider]  traZODone (DESYREL) 50 MG tablet Take 1 tablet (50 mg total) by mouth at bedtime as needed for sleep. 08/23/16  Yes Mercy Riding, MD    Current Facility-Administered Medications  Medication Dose Route Frequency Provider Last Rate Last Dose  . 0.9 %  sodium chloride infusion   Intravenous Once Wendee Beavers T, MD      . calcium acetate (PHOSLO) capsule 2,001 mg  2,001 mg Oral TID WC Estanislado Emms, MD      . cefTRIAXone (ROCEPHIN) 2 g in sodium chloride 0.9 % 100 mL IVPB  2 g Intravenous Q24H Mercy Riding, MD   Stopped at 07/30/17 2218  . insulin aspart (novoLOG) injection 0-9 Units  0-9 Units Subcutaneous TID WC Mercy Riding, MD   3 Units at 07/31/17 0834  .  lidocaine (PF) (XYLOCAINE) 1 % injection           . MEDLINE mouth rinse  15 mL Mouth Rinse BID Dickie La, MD   15 mL at 07/31/17 0852  . ondansetron (ZOFRAN) injection 4 mg  4 mg Intravenous Q8H PRN Shirley, [place], DO   4 mg at 07/31/17 0118    Allergies as of 07/30/2017 - Review Complete 07/30/2017  Allergen Reaction Noted  . Penicillins Hives and Itching     Family History  Problem Relation Age of Onset  . Diabetes Father   . Heart attack Brother   . Heart disease Brother   . Diabetes Mother   . Heart disease Mother   . Congestive Heart Failure Mother   . Heart disease Brother   . Heart attack Brother     Social History   Socioeconomic History  . Marital status: Married    Spouse name: Not on file  . Number of children: Not on file  . Years of education: Not on file  . Highest education level: Not on file  Occupational History  . Occupation: Disabled/Retired  Social Needs  . Financial resource strain: Not on file  . Food insecurity:    Worry: Not on file    Inability: Not on file  . Transportation needs:    Medical: Not on file    Non-medical: Not on file  Tobacco Use  . Smoking status: Never Smoker  . Smokeless tobacco: Never Used  Substance and Sexual Activity  . Alcohol use: No  . Drug use: No  . Sexual activity: Yes  Lifestyle  . Physical activity:    Days per week: Not on file    Minutes per session: Not on file  . Stress: Not on file  Relationships  . Social connections:    Talks on phone: Not on file    Gets together: Not on file    Attends religious service: Not on file    Active member of club or organization: Not on file    Attends meetings of clubs or organizations: Not on file    Relationship status: Not on file  . Intimate partner violence:    Fear of current or ex partner: Not on file    Emotionally abused: Not on file    Physically abused: Not on file    Forced sexual activity: Not on file  Other Topics Concern  . Not on file   Social History Narrative   Pt lives in [place] with wife.  He  Has 13 children.  He is disabled from [place].      Current Social History 01/08/2017  Patient lives with wife Levada Dy in one level home 01/08/2017   Transportation: Patient drives moped with helmet and safety vest around [place]. 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 [place], RN, BSN                                                                                                  Review of Systems: Gen:  , + chills, sweats, + anorexia, + fatigue, + weakness, HEENT: No visual complaints, No history of Retinopathy. Normal external appearance No Epistaxis or Sore throat. No sinusitis.   CV: Denies chest pain, angina, palpitations, syncope, orthopnea, PND, peripheral edema, and claudication. Resp:  dyspnea at rest, dyspnea with exercise, no cough, sputum, wheezing, coughing up blood, and pleurisy. 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  No renal calculi. MS: Denies joint pain, limitation of movement, and swelling, stiffness, low back pain, extremity pain. Denies muscle weakness, cramps, atrophy.  No use of non steroidal antiinflammatory drugs. Derm: Denies rash, itching, dry skin, hives, moles, warts, or unhealing ulcers.  Psych: Denies depression, anxiety, memory loss, suicidal ideation, hallucinations, paranoia, and confusion. Heme: Denies bruising, bleeding, and enlarged lymph  nodes. Neuro: tremors   Endocrine  DM.  Thyroid disease.  No Adrenal disease.  Physical Exam: Vital signs in last 24 hours: Temp:  [97.5 F (36.4 C)-98.1 F (36.7 C)] 97.5 F (36.4 C) (05/09 1000) Pulse Rate:  [41-69] 47 (05/09 1000) Resp:  [15-22] 19 (05/09 1000) BP: (112-152)/(58-87) 141/68 (05/09 1000) SpO2:  [91 %-100 %] 94 % (05/09 1000) Weight:  [165 lb (74.8 kg)-177 lb 4 oz (80.4 kg)] 177 lb 4 oz (80.4 kg) (05/09 0500) Last BM Date: 07/30/17 General:   Somnolent but appropriate  Head:  Normocephalic and atraumatic. Eyes:  Sclera clear, no icterus.   Conjunctiva pink. Ears:  Normal auditory acuity. Nose:  No deformity, discharge,  or lesions. Mouth:  No deformity or lesions, dentition normal. Neck:  Supple; no masses or thyromegaly. JVP not elevated Lungs:  Clear throughout to auscultation.   No wheezes, crackles, or rhonchi. No acute distress. Heart:  Regular rate and rhythm; no murmurs, clicks, rubs,  or gallops. Abdomen:  Soft, nontender and nondistended. No masses, hepatosplenomegaly or hernias noted. Normal bowel sounds, without guarding, and without rebound.   Msk:  Symmetrical without gross deformities. Normal posture. Pulses:  No carotid, renal, femoral bruits. DP and PT symmetrical and equal Extremities:  Without clubbing or edema. Neurologic:  Alert and  oriented x4;  grossly normal neurologically. Skin:  Intact without significant lesions or rashes.    Intake/Output from  previous day: 05/08 0701 - 05/09 0700 In: 485 [I.V.:20; Blood:315; IV Piggyback:150] Out: 830 [Urine:830] Intake/Output this shift: Total I/O In: 20 [Other:20] Out: 100 [Urine:100]  Lab Results: Recent Labs    07/30/17 1357 07/31/17 0629  WBC 14.4*  --   HGB 7.6* 8.1*  HCT 22.4* 23.9*  PLT 77*  --    BMET Recent Labs    07/30/17 1836 07/31/17 0629 07/31/17 0935  NA 133* 132* 131*  K 4.9 6.6* 6.8*  CL 91* 93* 93*  CO2 20* 17* 15*  GLUCOSE 228* 271* 256*  BUN 146* 159*  161*  CREATININE 9.73* 10.08* 10.24*  CALCIUM 7.2* 6.5* 6.2*  PHOS 11.9*  --   --    LFT Recent Labs    07/31/17 0629  PROT 6.1*  ALBUMIN 3.2*  AST 729*  ALT 2,366*  ALKPHOS 104  BILITOT 0.8   PT/INR Recent Labs    07/30/17 1838  LABPROT 19.9*  INR 1.71   Hepatitis Panel Recent Labs    07/30/17 1838  HEPBSAG Negative  HCVAB <0.1  HEPAIGM Negative  HEPBIGM Negative    Studies/Results: Dg Chest 2 View  Result Date: 07/30/2017 CLINICAL DATA:  Abdominal pain and pressure. EXAM: CHEST - 2 VIEW COMPARISON:  12/29/2015 FINDINGS: Moderate cardiac enlargement status post prior CABG. Low bilateral lung volumes. There is no evidence of pulmonary edema, consolidation, pneumothorax, nodule or pleural fluid. The thoracic spine shows diffuse degenerative disc disease. IMPRESSION: Cardiac enlargement and low bilateral lung volumes. Electronically Signed   By: Aletta Edouard M.D.   On: 07/30/2017 15:28   US Abdomen Complete  Result Date: 07/30/2017 CLINICAL DATA:  Acute kidney injury, elevated LFTs. Hepatic steatosis. EXAM: ABDOMEN ULTRASOUND COMPLETE COMPARISON:  Ultrasound the abdomen limited 12/30/2015. FINDINGS: Gallbladder: No gallstones or wall thickening visualized. No sonographic Murphy sign noted by sonographer. Common bile duct: Diameter: Normal measuring 4.8 mm Liver: Dense echotexture. No focal lesions identified. Portal vein is patent on color Doppler imaging with normal direction of blood flow towards the liver. IVC: No abnormality visualized. Pancreas: Visualized portion unremarkable. Spleen: Size and appearance within normal limits. Right Kidney: Length: 10.4 cm. Echogenicity within normal limits. No mass or hydronephrosis visualized. 2.5 cm cyst. Left Kidney: Length: 11.5 cm. Echogenicity within normal limits. No mass or hydronephrosis visualized. 2.0 cm cyst. Abdominal aorta: No aneurysm visualized. Other findings: Trace ascites. IMPRESSION: Unremarkable ultrasound of the  abdomen. No acute intra-abdominal findings. Uncomplicated appearing renal cystic disease without hydronephrosis or solid mass. Hepatic steatosis.  No gallstones or biliary ductal dilatation. Electronically Signed   By: Staci Righter M.D.   On: 07/30/2017 23:35    Assessment/Plan:  Acute on renal disease  This may just be progression of his renal failure  Stage 4 at baseline   I think that dialysis will help  Especially as he appears somewhat somnolent . Work up most likely diabetic nephropathy although I think sending urine studies and a renal ultrasound may be helpful  Hyperkalemia  Will plan urgent dialysis  Metabolic acidosis  Should improve with dialysis   Anemia  Check iron stores  Bones check PTH  Access will ask [facility] to see patient   LOS: 1 Portia Wisdom W _0 _1 :45 AM

## 2017-07-31 NOTE — Discharge Summary (Signed)
[facility] Discharge Summary  Patient name: Joseph Mcdonald Medical record number: [id number] Date of birth: September 21, 1950 Age: 67 y.o. 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 Elevated transaminases (resolved) HFrEF  35-40% Significant CAD Anemia  DM2 HTN Urinary retention BPH  Disposition: to home  Discharge Condition: stable  Discharge Exam: (from resident on shift that day) General: well nourished, well developed, NAD with non-toxic appearance HEENT: normocephalic, atraumatic, moist mucous membranes Neck: supple, non-tender without lymphadenopathy Cardiovascular: regular rate and rhythm with 2/6 systolic murmur Lungs: clear to auscultation bilaterally with normal work of breathing Abdomen: soft, non-tender, non-distended, normoactive bowel sounds Skin: warm, dry, no rashes or lesions, cap refill < 2 seconds, PermCath intact on right chest with dry dressings Extremities: warm and well perfused, normal tone, no edema  Brief Hospital Course:  Patient presented with significant abdominal distention, severely elevated transaminases and acute renal failure.  Renal: It was indicated quickly that HD would be necessary and patient was given a femoral cath for immediate access for first few sessions.  Access was then transitioned to permcath prior to last treatment as inpatient.  Vein mapping was completed prior to d/c and he was to see vascular outpatient for BVT.   He has a HD seat arranged for T/TH/S.  His abdominal distention, transaminases, respiratory distress, and pain all improved consistently with each successive HD treatment.  Cardiac:  With concern for fluid overload  having some cardiac etiology and with elevated troponins an ECHO was obtained with showed newly decreased EF to 35-40%.  Cath was performed and showed 70-100% occlusion diffusely in multiple vessels.  Plan was for medical management as this was not a surgical indication with hopes that he might recover on HD.  Elevated transaminases:  There was initially a concern of cirrhosis or obstructive causes for abdominal distention and GI was consulted to work up liver.  Given significant renal/cardiac concerns priority treatment was HD and as soon as that was started transaminases promptly and steadily recovered.  By time of D/C, GI was recommending outpatient followup with potential MRI w/o contrast vs liver biopsy.   Issues for Follow Up:  1. Patient now on HD.  T/TH/S.  Has tunneled cath for access.  Vein mapping done and BVT should be performed outpatient by vascular.  Did have femoral cath temporarily, please check for appropriate healing. 2. Patient with worsening HFrEF to 35-40% per ECHO and significant stenosis deemed "medical management only" per cath.  Med recs included below..  Patient should follow up with Cardiology. 3. There was initially elevated transaminases but those resolved with HD.  Consider checking liver function to ensure recovery persists.  GI had recommended outpatient followup with potential MRI w/o contrast vs biopsy. 4. 1 to 2 months for follow-up of his liver test and to determine if MRI of the liver is needed 5. Previously on hydralazine, since commited to HD have changed to long acting ARB which may need increase. Monitor BP.  Significant Procedures: cath, HD access via femcath and then permcath  Significant Labs and Imaging:  Recent Labs  Lab 08/07/17 0222 08/07/17 1219 08/08/17 0243  WBC 12.7* 13.0* 11.1*  HGB 8.7* 8.6* 8.5*  HCT 25.6* 25.3* 25.2*  PLT 141* 164 186   Recent  Labs  Lab 08/08/17 0243  NA 135  K 4.0  CL 99*  CO2 26  GLUCOSE 177*  BUN 49*   CREATININE 4.32*  CALCIUM 7.5*  ALKPHOS 88  AST 33  ALT 144*  ALBUMIN 2.7*    Dg Chest 2 View  Result Date: 07/30/2017 CLINICAL DATA:  Abdominal pain and pressure. EXAM: CHEST - 2 VIEW COMPARISON:  12/29/2015 FINDINGS: Moderate cardiac enlargement status post prior CABG. Low bilateral lung volumes. There is no evidence of pulmonary edema, consolidation, pneumothorax, nodule or pleural fluid. The thoracic spine shows diffuse degenerative disc disease. IMPRESSION: Cardiac enlargement and low bilateral lung volumes. Electronically Signed   By: Aletta Edouard M.D.   On: 07/30/2017 15:28   US Abdomen Complete  Result Date: 07/30/2017 CLINICAL DATA:  Acute kidney injury, elevated LFTs. Hepatic steatosis. EXAM: ABDOMEN ULTRASOUND COMPLETE COMPARISON:  Ultrasound the abdomen limited 12/30/2015. FINDINGS: Gallbladder: No gallstones or wall thickening visualized. No sonographic Murphy sign noted by sonographer. Common bile duct: Diameter: Normal measuring 4.8 mm Liver: Dense echotexture. No focal lesions identified. Portal vein is patent on color Doppler imaging with normal direction of blood flow towards the liver. IVC: No abnormality visualized. Pancreas: Visualized portion unremarkable. Spleen: Size and appearance within normal limits. Right Kidney: Length: 10.4 cm. Echogenicity within normal limits. No mass or hydronephrosis visualized. 2.5 cm cyst. Left Kidney: Length: 11.5 cm. Echogenicity within normal limits. No mass or hydronephrosis visualized. 2.0 cm cyst. Abdominal aorta: No aneurysm visualized. Other findings: Trace ascites. IMPRESSION: Unremarkable ultrasound of the abdomen. No acute intra-abdominal findings. Uncomplicated appearing renal cystic disease without hydronephrosis or solid mass. Hepatic steatosis.  No gallstones or biliary ductal dilatation. Electronically Signed   By: Staci Righter M.D.   On: 07/30/2017 23:35    Results/Tests Pending at Time of Discharge:   Discharge Medications:   Allergies as of 08/09/2017      Reactions   Penicillins Hives, Itching, Other (See Comments)   PATIENT HAS HAD A PCN REACTION WITH IMMEDIATE RASH, FACIAL/TONGUE/THROAT SWELLING, SOB, OR LIGHTHEADEDNESS WITH HYPOTENSION:  #  #  YES  #  #  HAS PT DEVELOPED SEVERE RASH INVOLVING MUCUS MEMBRANES or SKIN NECROSIS: #  #  YES  #  # Has patient had a PCN reaction that required hospitalization No Has patient had a PCN reaction occurring within the last 10 years: No      Medication List    STOP taking these medications   cloNIDine 0.1 mg/24hr patch Commonly known as:  CATAPRES - Dosed in mg/24 hr   escitalopram 10 MG tablet Commonly known as:  LEXAPRO   gabapentin 300 MG capsule Commonly known as:  NEURONTIN   NIFEdipine 60 MG 24 hr tablet Commonly known as:  PROCARDIA XL/ADALAT-CC   sodium bicarbonate 650 MG tablet     TAKE these medications   allopurinol 100 MG tablet Commonly known as:  ZYLOPRIM Take 1 tablet (100 mg total) by mouth every other day. Post-dialysis What changed:    when to take this  additional instructions   aspirin 81 MG tablet Take 1 tablet (81 mg total) by mouth daily.   atorvastatin 40 MG tablet Commonly known as:  LIPITOR Take 1 tablet (40 mg total) by mouth daily.   BASAGLAR KWIKPEN 100 UNIT/ML Sopn Inject 0.1 mLs (10 Units total) into the skin every morning. What changed:  how much to take   calcium acetate 667 MG capsule Commonly known as:  PHOSLO Take 3 capsules (2,001 mg total) by  mouth 3 (three) times daily with meals.   hydrALAZINE 50 MG tablet Commonly known as:  APRESOLINE Take 1 tablet (50 mg total) by mouth 3 (three) times daily.   isosorbide mononitrate 30 MG 24 hr tablet Commonly known as:  IMDUR Take 0.5 tablets (15 mg total) by mouth daily.   liraglutide 18 MG/3ML Sopn Commonly known as:  VICTOZA Inject 1.2 mg once daily; if your blood glucose is greater than 140 mg/dL, may increase to 1.8 mg once daily.   losartan 50 MG  tablet Commonly known as:  COZAAR Take 1 tablet (50 mg total) by mouth at bedtime.   multivitamin Tabs tablet Take 1 tablet by mouth at bedtime.   NARCAN 4 MG/0.1ML Liqd nasal spray kit Generic drug:  naloxone Place 1 spray into the nose once.   oxyCODONE-acetaminophen 5-325 MG tablet Commonly known as:  ROXICET Take 1 tablet by mouth every 8 (eight) hours as needed for severe pain.   polyethylene glycol packet Commonly known as:  MIRALAX / GLYCOLAX Take 17 g by mouth 2 (two) times daily. What changed:    when to take this  reasons to take this   tamsulosin 0.4 MG Caps capsule Commonly known as:  FLOMAX Take 1 capsule (0.4 mg total) by mouth daily after breakfast.   traZODone 50 MG tablet Commonly known as:  DESYREL Take 1 tablet (50 mg total) by mouth at bedtime as needed for sleep.       Discharge Instructions: Please refer to Patient Instructions section of EMR for full details.  Patient was counseled important signs and symptoms that should prompt return to medical care, changes in medications, dietary instructions, activity restrictions, and follow up appointments.   Follow-Up Appointments: Follow-up Information    [facility]-Home Follow up.   Specialty:  Home Health Services Why:  [place] information: [place] [address] [place], [place], DO 08/13/2017, 8:33 AM PGY-1, [facility]

## 2017-07-31 NOTE — Progress Notes (Signed)
CRITICAL VALUE ALERT  Critical Value:  K 6.8 slightly hemolyzed, Ca 6.2, Troponin 11.75  Date & Time Notied:  07/31/17 1045a  Provider Notified: FMTS pager  Orders Received/Actions taken: will await orders, patient in HD at this time

## 2017-07-31 NOTE — Progress Notes (Signed)
  Echocardiogram 2D Echocardiogram has been performed.  Joseph Mcdonald T Mava Suares 07/31/2017, 3:32 PM

## 2017-07-31 NOTE — Plan of Care (Signed)
Patient arrived to 2w,  Complaining of chronic lower back pain and nausea unrelieved by antiemetic given earlier.   FMTS notified, Zofran given with some relive  Patient remains very drowsy and fatigued.  Currently on 5 L nasal cannula oxygen saturation sustained above 92%   Will continue to monitor

## 2017-07-31 NOTE — Progress Notes (Signed)
Patient ID: Joseph Mcdonald, male   DOB: April 19, 1950, 67 y.o.   MRN: [id number]   HD cath was placed by Dr Justin Mend at bedside in Dialysis  Will cancel IR request  Call us if need [place]

## 2017-08-01 ENCOUNTER — Inpatient Hospital Stay (HOSPITAL_COMMUNITY): Payer: Medicare Other

## 2017-08-01 DIAGNOSIS — Z01810 Encounter for preprocedural cardiovascular examination: Secondary | ICD-10-CM

## 2017-08-01 LAB — COMPREHENSIVE METABOLIC PANEL
ALBUMIN: 2.9 g/dL — AB (ref 3.5–5.0)
ALK PHOS: 120 U/L (ref 38–126)
ALT: 2366 U/L — AB (ref 17–63)
ALT: 1783 U/L — ABNORMAL HIGH (ref 17–63)
ANION GAP: 16 — AB (ref 5–15)
AST: 729 U/L — AB (ref 15–41)
AST: 362 U/L — AB (ref 15–41)
Albumin: 3.2 g/dL — ABNORMAL LOW (ref 3.5–5.0)
Alkaline Phosphatase: 104 U/L (ref 38–126)
Anion gap: 22 — ABNORMAL HIGH (ref 5–15)
BILIRUBIN TOTAL: 0.8 mg/dL (ref 0.3–1.2)
BILIRUBIN TOTAL: 0.7 mg/dL (ref 0.3–1.2)
BUN: 159 mg/dL — AB (ref 6–20)
BUN: 117 mg/dL — ABNORMAL HIGH (ref 6–20)
CO2: 17 mmol/L — ABNORMAL LOW (ref 22–32)
CO2: 21 mmol/L — AB (ref 22–32)
CREATININE: 10.08 mg/dL — AB (ref 0.61–1.24)
Calcium: 6.5 mg/dL — ABNORMAL LOW (ref 8.9–10.3)
Calcium: 6.7 mg/dL — ABNORMAL LOW (ref 8.9–10.3)
Chloride: 93 mmol/L — ABNORMAL LOW (ref 101–111)
Chloride: 99 mmol/L — ABNORMAL LOW (ref 101–111)
Creatinine, Ser: 8.26 mg/dL — ABNORMAL HIGH (ref 0.61–1.24)
GFR calc Af Amer: 7 mL/min — ABNORMAL LOW (ref >60)
GFR calc non Af Amer: 6 mL/min — ABNORMAL LOW (ref >60)
GFR, EST AFRICAN AMERICAN: 5 mL/min — AB (ref >60)
GFR, EST NON AFRICAN AMERICAN: 5 mL/min — AB (ref >60)
GLUCOSE: 144 mg/dL — AB (ref 65–99)
Glucose, Bld: 271 mg/dL — ABNORMAL HIGH (ref 65–99)
POTASSIUM: 6.6 mmol/L — AB (ref 3.5–5.1)
POTASSIUM: 4.6 mmol/L (ref 3.5–5.1)
SODIUM: 136 mmol/L (ref 135–145)
Sodium: 132 mmol/L — ABNORMAL LOW (ref 135–145)
TOTAL PROTEIN: 6.1 g/dL — AB (ref 6.5–8.1)
TOTAL PROTEIN: 5.4 g/dL — AB (ref 6.5–8.1)

## 2017-08-01 LAB — CBC
HEMATOCRIT: 23.9 % — AB (ref 39.0–52.0)
HEMOGLOBIN: 8.2 g/dL — AB (ref 13.0–17.0)
MCH: 30.6 pg (ref 26.0–34.0)
MCHC: 34.3 g/dL (ref 30.0–36.0)
MCV: 89.2 fL (ref 78.0–100.0)
Platelets: 82 10*3/uL — ABNORMAL LOW (ref 150–400)
RBC: 2.68 MIL/uL — ABNORMAL LOW (ref 4.22–5.81)
RDW: 14.1 % (ref 11.5–15.5)
WBC: 13.5 10*3/uL — ABNORMAL HIGH (ref 4.0–10.5)

## 2017-08-01 LAB — TYPE AND SCREEN
ABO/RH(D): A POS
Antibody Screen: NEGATIVE
UNIT DIVISION: 0

## 2017-08-01 LAB — HEPATITIS B CORE ANTIBODY, TOTAL: Hep B Core Total Ab: NEGATIVE

## 2017-08-01 LAB — HEPATITIS B SURFACE ANTIGEN: HEP B S AG: NEGATIVE

## 2017-08-01 LAB — GLUCOSE, CAPILLARY
GLUCOSE-CAPILLARY: 182 mg/dL — AB (ref 65–99)
GLUCOSE-CAPILLARY: 208 mg/dL — AB (ref 65–99)
Glucose-Capillary: 132 mg/dL — ABNORMAL HIGH (ref 65–99)
Glucose-Capillary: 154 mg/dL — ABNORMAL HIGH (ref 65–99)

## 2017-08-01 LAB — BPAM RBC
BLOOD PRODUCT EXPIRATION DATE: 201905292359
ISSUE DATE / TIME: 201905090056
UNIT TYPE AND RH: 6200

## 2017-08-01 LAB — GAMMA GT: GGT: 256 U/L — AB (ref 7–50)

## 2017-08-01 LAB — OCCULT BLOOD X 1 CARD TO LAB, STOOL: Fecal Occult Bld: POSITIVE — AB

## 2017-08-01 LAB — LIPASE, BLOOD: Lipase: 80 U/L — ABNORMAL HIGH (ref 11–51)

## 2017-08-01 LAB — HEPATITIS B SURFACE ANTIBODY,QUALITATIVE: Hep B S Ab: NONREACTIVE

## 2017-08-01 LAB — ECHOCARDIOGRAM COMPLETE
Height: 64 in
Weight: 2867.74 oz

## 2017-08-01 LAB — UREA NITROGEN, URINE: UREA NITROGEN UR: 405 mg/dL

## 2017-08-01 MED ORDER — DARBEPOETIN ALFA 100 MCG/0.5ML IJ SOSY: 100.0000 ug | PREFILLED_SYRINGE | INTRAMUSCULAR | Status: DC

## 2017-08-01 MED ORDER — IOHEXOL 300 MG/ML  SOLN
100.0000 mL | Freq: Once | INTRAMUSCULAR | Status: AC | PRN
  Administered 2017-08-01: 100 mL via INTRAVENOUS

## 2017-08-01 NOTE — Plan of Care (Addendum)
  Problem: Activity: Goal: Risk for activity intolerance will decrease Outcome: Not Progressing   Problem: Nutrition: Goal: Adequate nutrition will be maintained Outcome: Not Progressing   

## 2017-08-01 NOTE — Progress Notes (Signed)
Updated patient on current situation.  GI recommending CT abdomen and pelvis with contrast.  Nephrology states kidney function will likely not improve and agrees with plan.  LFTs seem to be improving though GGT significantly elevated with an alk phosphatase elevated above his baseline.  We will proceed with CT to ascertain source of elevated transaminase.  Patient in agreement.  Harriet [place], [place], PGY-2

## 2017-08-01 NOTE — Progress Notes (Signed)
Progress Note  Patient Name: Joseph Mcdonald Date of Encounter: 08/01/2017  Primary Cardiologist: Dorris Carnes, MD   Subjective    Joseph Mcdonald is a 67 y.o. 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 Nori Riis.  He feels better after getting dialysis yesterday.  He has not had dialysis yet today although he was told that he would.   Inpatient Medications    Scheduled Meds: . bisacodyl  10 mg Rectal q morning - 10a  . calcium acetate  2,001 mg Oral TID WC  . insulin aspart  0-9 Units Subcutaneous TID WC  . mouth rinse  15 mL Mouth Rinse BID   Continuous Infusions: . sodium chloride    . cefTRIAXone (ROCEPHIN)  IV Stopped (07/31/17 2029)   PRN Meds: hydrALAZINE, ondansetron (ZOFRAN) IV   Vital Signs    Vitals:   08/01/17 0400 08/01/17 0503 08/01/17 0754 08/01/17 0832  BP: (!) 160/78 (!) 160/82 (!) 173/81 (!) 164/78  Pulse: 75  76 77  Resp: 14  16 13   Temp: 98.4 F (36.9 C)  97.9 F (36.6 C)   TempSrc: Oral  Oral   SpO2: 99%  100% 99%  Weight:      Height:        Intake/Output Summary (Last 24 hours) at 08/01/2017 1032 Last data filed at 08/01/2017 0401 Gross per 24 hour  Intake 100 ml  Output 1800 ml  Net -1700 ml   Filed Weights   07/31/17 0012 07/31/17 0500 07/31/17 1036  Weight: 177 lb 4 oz (80.4 kg) 177 lb 4 oz (80.4 kg) 179 lb 3.7 oz (81.3 kg)    Telemetry    NSR  - Personally Reviewed  ECG     NSR  - Personally Reviewed  Physical Exam   GEN:  middle age man,   Appears chronically ill ,    Neck: No JVD Cardiac: RRR,  Soft systolic murmur  Respiratory: Clear to auscultation bilaterally. GI: Soft, nontender, non-distended  MS: No edema; No deformity. Neuro:  Nonfocal  Psych: Normal affect   Labs    Chemistry Recent Labs    Lab 07/30/17 1357 07/30/17 1836 07/31/17 0629 07/31/17 0935 08/01/17 0305  NA 130* 133* 132* 131* 136  K 6.7* 4.9 6.6* 6.8* 4.6  CL 91* 91* 93* 93* 99*  CO2 17* 20* 17* 15* 21*  GLUCOSE 188* 228* 271* 256* 144*  BUN 140* 146* 159* 161* 117*  CREATININE 9.65* 9.73* 10.08* 10.24* 8.26*  CALCIUM 7.0* 7.2* 6.5* 6.2* 6.7*  PROT 6.4*  --  6.1*  --  5.4*  ALBUMIN 3.4* 3.4* 3.2*  --  2.9*  AST 1,408*  --  729*  --  362*  ALT 2,996*  --  2,366*  --  1,783*  ALKPHOS 94  --  104  --  120  BILITOT 1.2  --  0.8  --  0.7  GFRNONAA 5* 5* 5* 5* 6*  GFRAA 6* 6* 5* 5* 7*  ANIONGAP 22* 22* 22* 23* 16*     Hematology Recent Labs  Lab 07/30/17 1357 07/30/17 1838 07/31/17 0629 08/01/17 0305  WBC 14.4*  --   --  13.5*  RBC 2.52* 2.44*  --  2.68*  HGB 7.6*  --  8.1* 8.2*  HCT 22.4*  --  23.9* 23.9*  MCV 88.9  --   --  89.2  MCH 30.2  --   --  30.6  MCHC 33.9  --   --  34.3  RDW 13.8  --   --  14.1  PLT 77*  --   --  82*    Cardiac Enzymes Recent Labs  Lab 07/30/17 2334 07/31/17 0935 07/31/17 1607 07/31/17 2208  TROPONINI 11.58* 11.75* 11.03* 11.30*   No results for input(s): TROPIPOC in the last 168 hours.   BNP Recent Labs  Lab 07/30/17 1358  BNP 4,146.3*     DDimer No results for input(s): DDIMER in the last 168 hours.   Radiology    Dg Chest 2 View  Result Date: 07/30/2017 CLINICAL DATA:  Abdominal pain and pressure. EXAM: CHEST - 2 VIEW COMPARISON:  12/29/2015 FINDINGS: Moderate cardiac enlargement status post prior CABG. Low bilateral lung volumes. There is no evidence of pulmonary edema, consolidation, pneumothorax, nodule or pleural fluid. The thoracic spine shows diffuse degenerative disc disease. IMPRESSION: Cardiac enlargement and low bilateral lung volumes. Electronically Signed   By: Aletta Edouard M.D.   On: 07/30/2017 15:28   US Abdomen Complete  Result Date: 07/30/2017 CLINICAL DATA:  Acute kidney injury, elevated LFTs. Hepatic steatosis. EXAM: ABDOMEN  ULTRASOUND COMPLETE COMPARISON:  Ultrasound the abdomen limited 12/30/2015. FINDINGS: Gallbladder: No gallstones or wall thickening visualized. No sonographic Murphy sign noted by sonographer. Common bile duct: Diameter: Normal measuring 4.8 mm Liver: Dense echotexture. No focal lesions identified. Portal vein is patent on color Doppler imaging with normal direction of blood flow towards the liver. IVC: No abnormality visualized. Pancreas: Visualized portion unremarkable. Spleen: Size and appearance within normal limits. Right Kidney: Length: 10.4 cm. Echogenicity within normal limits. No mass or hydronephrosis visualized. 2.5 cm cyst. Left Kidney: Length: 11.5 cm. Echogenicity within normal limits. No mass or hydronephrosis visualized. 2.0 cm cyst. Abdominal aorta: No aneurysm visualized. Other findings: Trace ascites. IMPRESSION: Unremarkable ultrasound of the abdomen. No acute intra-abdominal findings. Uncomplicated appearing renal cystic disease without hydronephrosis or solid mass. Hepatic steatosis.  No gallstones or biliary ductal dilatation. Electronically Signed   By: Staci Righter M.D.   On: 07/30/2017 23:35    Cardiac Studies     Patient Profile     67 y.o. male with acute renal insufficiency , hx of CAD and + troponins  Assessment & Plan    1.  Troponin elevation: His troponins remained around 11.  This is very elevated but the trend is flat.  Is not consistent with an acute coronary syndrome.  Suspect that this is more due to his congestive heart failure/renal failure.  He is not having any episodes of angina.  We should wait until he is more stable before pursuing ischemic work-up.  2.  Coronary artery disease: He has a history of coronary artery bypass grafting.  He is had normal left ventricular systolic function in the past.  Echocardiogram from yesterday reveals diffuse left ventricular hypokinesis with an ejection fraction of 35 to 40%.  We are not able to assess his diastolic  function.  He has mild mitral regurgitation.  Mild pulmonary hypertension with an estimated PA pressure of 37.    For questions or updates, please contact [place] Please consult [email or web address] for contact info under Cardiology/STEMI.      Signed, Mertie Moores, MD  08/01/2017, 10:32 AM

## 2017-08-01 NOTE — Progress Notes (Signed)
Vein mapping completed. Results are in [place]. Oda Cogan, BS, RDMS, RVT

## 2017-08-01 NOTE — Progress Notes (Signed)
Right  Upper Extremity Vein Map   Cephalic  Segment Diameter Depth Comment  1. Axilla mm mm   2. Mid upper arm mm mm   3. Above AC mm mm   4. In AC mm mm   5. Below AC mm mm   6. Mid forearm mm mm   7. Wrist mm mm    mm mm    mm mm    mm mm    Basilic  Segment Diameter Depth Comment  1. Axilla mm mm   2. Mid upper arm mm mm   3. Above AC mm mm   4. In AC mm mm   5. Below AC mm mm   6. Mid forearm mm mm   7. Wrist mm mm    mm mm    mm mm    mm mm     Left  Upper Extremity Vein Map   Cephalic  Segment Diameter Depth Comment  1. Axilla mm mm   2. Mid upper arm mm mm   3. Above AC mm mm   4. In AC mm mm   5. Below AC mm mm   6. Mid forearm mm mm   7. Wrist mm mm    mm mm    mm mm    mm mm    Basilic  Segment Diameter Depth Comment  1. Axilla mm mm   2. Mid upper arm mm mm   3. Above AC mm mm   4. In AC mm mm   5. Below AC mm mm   6. Mid forearm mm mm   7. Wrist mm mm    mm mm    mm mm    mm mm

## 2017-08-01 NOTE — Progress Notes (Signed)
[facility] ROUNDING NOTE   Subjective:   Interval History:   67 y.o.malepresenting with dyspnea and abdominal pain. PMH is significant forCAD status post CABG 2011, CKD 4, DM-2,hypertension, GERD, chronic back pain, secondary hyperparathyroidism, anemia of renal disease, gout, bradycardia.He has been followed by nephrology and has a baseline creatinine of about 3.8  This has increased to about 10 with a K that has increased to above 6  He was admitted with abdominal pain and dyspnea and a GI evaluation has been asked for. He has increased in his liver enzymes and an abdominal ultrasound has been requested. He has had a CABG in the past and cardiology has been asked to evaluate the patient. He underwent emergent dialysis for hyperkalemia 5/9 He is stable this morning with less complaints of dyspnea although he has some bleeding around the hemodialysis catheter site    Objective:  Vital signs in last 24 hours:  Temp:  [97.9 F (36.6 C)-99.4 F (37.4 C)] 97.9 F (36.6 C) (05/10 0754) Pulse Rate:  [61-80] 77 (05/10 0832) Resp:  [11-19] 13 (05/10 0832) BP: (137-173)/(68-84) 164/78 (05/10 0832) SpO2:  [93 %-100 %] 99 % (05/10 0832)  Weight change: 14 lb 3.7 oz (6.456 kg) Filed Weights   07/31/17 0012 07/31/17 0500 07/31/17 1036  Weight: 177 lb 4 oz (80.4 kg) 177 lb 4 oz (80.4 kg) 179 lb 3.7 oz (81.3 kg)    Intake/Output: I/O last 3 completed shifts: In: 605 [I.V.:20; Blood:315; Other:20; IV Piggyback:250] Out: 2730 [Urine:1730; Other:1000]   Intake/Output this shift:  No intake/output data recorded.  CVS- RRR RS- CTA ABD- BS present soft non-distended   Dialysis catheter in Right Groin with some saturation of guaze EXT- no edema   Basic Metabolic Panel: Recent Labs  Lab 07/30/17 1357 07/30/17 1836 07/31/17 0629 07/31/17 0935 08/01/17 0305  NA 130* 133* 132* 131* 136  K 6.7* 4.9 6.6* 6.8* 4.6  CL 91* 91* 93* 93* 99*  CO2 17* 20* 17* 15* 21*  GLUCOSE  188* 228* 271* 256* 144*  BUN 140* 146* 159* 161* 117*  CREATININE 9.65* 9.73* 10.08* 10.24* 8.26*  CALCIUM 7.0* 7.2* 6.5* 6.2* 6.7*  PHOS  --  11.9*  --   --   --     Liver Function Tests: Recent Labs  Lab 07/30/17 1357 07/30/17 1836 07/31/17 0629 08/01/17 0305  AST 1,408*  --  729* 362*  ALT 2,996*  --  2,366* 1,783*  ALKPHOS 94  --  104 120  BILITOT 1.2  --  0.8 0.7  PROT 6.4*  --  6.1* 5.4*  ALBUMIN 3.4* 3.4* 3.2* 2.9*   Recent Labs  Lab 08/01/17 0305  LIPASE 80*   Recent Labs  Lab 07/30/17 1838  AMMONIA 35    CBC: Recent Labs  Lab 07/30/17 1357 07/31/17 0629 08/01/17 0305  WBC 14.4*  --  13.5*  NEUTROABS 12.1*  --   --   HGB 7.6* 8.1* 8.2*  HCT 22.4* 23.9* 23.9*  MCV 88.9  --  89.2  PLT 77*  --  82*    Cardiac Enzymes: Recent Labs  Lab 07/30/17 1836 07/30/17 2334 07/31/17 0935 07/31/17 1607 07/31/17 2208  TROPONINI 14.06* 11.58* 11.75* 11.03* 11.30*    BNP: Invalid input(s): POCBNP  CBG: Recent Labs  Lab 07/30/17 2328 07/31/17 0752 07/31/17 1539 07/31/17 2114 08/01/17 0734  GLUCAP 266* 246* 155* 156* 132*    Microbiology: Results for orders placed or performed during the hospital encounter of 07/30/17  Culture, blood (routine x 2)     Status: None (Preliminary result)   Collection Time: 07/30/17  8:00 PM  Result Value Ref Range Status   Specimen Description BLOOD RIGHT HAND  Final   Special Requests   Final    BOTTLES DRAWN AEROBIC AND ANAEROBIC Blood Culture adequate volume   Culture   Final    NO GROWTH 2 DAYS Performed at [facility], [address], [place], [place] [address]    Report Status PENDING  Incomplete  Culture, blood (routine x 2)     Status: None (Preliminary result)   Collection Time: 07/30/17  8:27 PM  Result Value Ref Range Status   Specimen Description BLOOD LEFT HAND  Final   Special Requests   Final    BOTTLES DRAWN AEROBIC AND ANAEROBIC Blood Culture adequate volume   Culture   Final    NO  GROWTH 2 DAYS Performed at [facility], [place] [address], [place], [place] [address]    Report Status PENDING  Incomplete  MRSA PCR Screening     Status: None   Collection Time: 07/31/17 12:21 AM  Result Value Ref Range Status   MRSA by PCR NEGATIVE NEGATIVE Final    Comment:        The GeneXpert MRSA Assay (FDA approved for NASAL specimens only), is one component of a comprehensive MRSA colonization surveillance program. It is not intended to diagnose MRSA infection nor to guide or monitor treatment for MRSA infections. Performed at [facility], [place] [address], [place], [place] [address]     Coagulation Studies: Recent Labs    07/30/17 1838  LABPROT 19.9*  INR 1.71    Urinalysis: Recent Labs    07/30/17 2222  COLORURINE YELLOW  LABSPEC 1.013  PHURINE 5.0  GLUCOSEU 150*  HGBUR MODERATE*  BILIRUBINUR NEGATIVE  KETONESUR NEGATIVE  PROTEINUR >=300*  NITRITE NEGATIVE  LEUKOCYTESUR NEGATIVE      Imaging: Dg Chest 2 View  Result Date: 07/30/2017 CLINICAL DATA:  Abdominal pain and pressure. EXAM: CHEST - 2 VIEW COMPARISON:  12/29/2015 FINDINGS: Moderate cardiac enlargement status post prior CABG. Low bilateral lung volumes. There is no evidence of pulmonary edema, consolidation, pneumothorax, nodule or pleural fluid. The thoracic spine shows diffuse degenerative disc disease. IMPRESSION: Cardiac enlargement and low bilateral lung volumes. Electronically Signed   By: Aletta Edouard M.D.   On: 07/30/2017 15:28   US Abdomen Complete  Result Date: 07/30/2017 CLINICAL DATA:  Acute kidney injury, elevated LFTs. Hepatic steatosis. EXAM: ABDOMEN ULTRASOUND COMPLETE COMPARISON:  Ultrasound the abdomen limited 12/30/2015. FINDINGS: Gallbladder: No gallstones or wall thickening visualized. No sonographic Murphy sign noted by sonographer. Common bile duct: Diameter: Normal measuring 4.8 mm Liver: Dense echotexture. No focal lesions identified. Portal vein is patent on  color Doppler imaging with normal direction of blood flow towards the liver. IVC: No abnormality visualized. Pancreas: Visualized portion unremarkable. Spleen: Size and appearance within normal limits. Right Kidney: Length: 10.4 cm. Echogenicity within normal limits. No mass or hydronephrosis visualized. 2.5 cm cyst. Left Kidney: Length: 11.5 cm. Echogenicity within normal limits. No mass or hydronephrosis visualized. 2.0 cm cyst. Abdominal aorta: No aneurysm visualized. Other findings: Trace ascites. IMPRESSION: Unremarkable ultrasound of the abdomen. No acute intra-abdominal findings. Uncomplicated appearing renal cystic disease without hydronephrosis or solid mass. Hepatic steatosis.  No gallstones or biliary ductal dilatation. Electronically Signed   By: Staci Righter M.D.   On: 07/30/2017 23:35     Medications:   .  sodium chloride    . cefTRIAXone (ROCEPHIN)  IV Stopped (07/31/17 2029)   . bisacodyl  10 mg Rectal q morning - 10a  . calcium acetate  2,001 mg Oral TID WC  . insulin aspart  0-9 Units Subcutaneous TID WC  . mouth rinse  15 mL Mouth Rinse BID   hydrALAZINE, ondansetron (ZOFRAN) IV  Assessment/ Plan:   Acute on chronic renal disease  This may just be progression of his renal failure  Stage 4 at baseline   I think that dialysis will help  Especially as he appears somewhat somnolent . Work up most likely diabetic nephropathy    Renal ultrasound showed no hydronephrosis and urinalysis showed proteinuria >300  Hyperkalemia   resolved   Metabolic acidosis  improved   Anemia   increased ferritin   Would consider hemochromatosis   request GI opinion ?  Bones  PTH pending   Combination of transaminitis and also increased ferritin would lead me to favor hemochromatosis as the diagnosis   Congestive heart failure  EF 30%  Increased in troponin   Will continue with dialysis and will await placement of access per VVS  I do not think his renal function will recover and would  support CT scan with contrast       LOS: 2 Letishia Elliott W @TODAY @10 :42 AM

## 2017-08-01 NOTE — Progress Notes (Signed)
Family Medicine Teaching Service Daily Progress Note Intern Pager: [phone]  Patient name: Joseph Mcdonald Medical record number: [id number] Date of birth: 1951/03/05 Age: 67 y.o. Gender: male  Primary Care Provider: Mercy Riding, MD Consultants: nephro/cardio Code Status: full  Pt Overview and Major Events to Date:  5/8 admitted  Assessment and Plan: Joseph Mcdonald is a 67 y.o. male presenting with dyspnea and abdominal pain. PMH is significant for CAD status post CABG 2011, CKD 4, DM-2, hypertension, GERD, chronic back pain, secondary hyperparathyroidism, anemia of renal disease, gout, bradycardia.   Dyspnea: IMPROVING.   mild dyspnea on 4L.  In CHF exacerbation vs new baseline of HFrEF 30% down from 55-60%.  acute renal failure.   Troponin 14.02 likely a combination of demand ischemia and acute renal failure. EKG with bradycardia,  junctional rhythm, RBBB, LAFB, nonspecific T abnormalities, lateral leads which is different from his prior ekg. Pneumonia is a possibility given leukocytosis with left shift but he has no cough or fever.  Procalcitonin 2.21 -stopped lasix drip as now on HD -ACS rule out as below -GI consulted, recs implemented -Oxygen as needed -Daily weights, I&O -CTX (5/9- )  Abdominal pain: IMPROVING abdomen much less distended and no tenderness. No ascites.  Had non-bloody BM overnight and "a ton" of flatus.  Good bowel sounds.  He also has improving transaminitis. He has history of alcohol use with hepatic steatosis on previous ultrasound. -Considering CT abdomen w/o contrast depending of [place] (timing dependant on nephro determination on salvageable kidney function. -work up for transaminitis as below -Ceftriaxone (5/9- )  Femoral Cath Access Bleeding: resealed by IV team once, still with slow leaking.  Hgb stable and slightly impoved to 8.2.  No lightheadedness. Concern is low plattelets (82) but patient is not on anticoagulants (SCDs only) -have  discussed with patient to inform immediately if lightheaded or bleeding increases -will monitor closely with nurse assistance  Transaminitis: improving slightly ALT 3000>>1700, AST 1400>>362.  Likely HD is main cause of improvement.  Could be due to congestive hepatopathy but patient without significant edema in his extremities or autoimmune.  PT high 19.9, INR normal 1.71, tylenol <10.  Abdominal US without significant findings.  Hep panel neg -Consider autoimmune work-up -GI consulted, recs appreciated -trending LFTs -Ceftriaxone (5/9- ) for possible intra-abdominal infection.   High ferritin/iron overload: Ferritin >7,500, iron 230.  -consulted GI -considering abd/CT if kidneys not expected to recover  CAD status post CABG in 2011.  Currently with dyspnea but no chest pain. Initial EKG with bradycardia, junctional rhythm, RBBB and LAFB which could be due to hyperkalemia.  Troponin 14.01.  Discussed with cardiology (Dr. Debara Pickett) who suggested cycling troponin and EKG. Echo showed new HFrEF to 30%, may recover -trend troponins again after HD -Repeat EKGs -cardiology consulted -potential cath when more stabilized  ESRD on HD: Serum creatinine now down to 8.6 s/p HD via femcath.  Baseline was about 3.8.  BUN 159.   Almost no urine output -Follow nephrology rec -will vein map likely today/tommorow -fistula formation likely sometime next week if stabilized  Hyperkalemia: K4.6 5/10 after HD 5/9   -expect to manage with HD  Anion gap metabolic acidosis:  Gap now 16, 1day s/p HD.   Likely due to acute renal failure/uremia. -Trend BMP -managed via HD  Anemia of chronic disease: Stable at 8.2 x2days after transfusion.  Baseline 9-10.  Could be dilutional due to possible fluid overload as well.  FOBT positive.  Anemia panel with high  iron/ferritin -Daily CBC -s/p Transfuse 1 unit.  Hold 2 units.  Thrombocytopenia: Platelets 77>82.  Likely due to consumptive process versus  production. -Daily CBC  DM2: stable as patient is NPO.  Well controlled at baseline.  Last A1c 7.2 about a month ago.  On Lantus and Victoza at home. -SSI-thin -CBG ACHS  Hypertension: again hypertesnive to 150s-160s sbp -major management will be HD in the immediate -hydralazine PRN for sbp >160  FEN/GI: c/o dry mouth -N.p.o. except for sips with meds -added ice chips -holding diet orders pending GI input or continue clinical improvement   Prophylaxis:  -SCD given thrombocytopenia  Disposition: still in stepdown pending further stabilization  Subjective:  Feeling improved, would love a diet but understands concerns  Objective: Temp:  [97.5 F (36.4 C)-99.4 F (37.4 C)] 98.4 F (36.9 C) (05/10 0400) Pulse Rate:  [47-80] 75 (05/10 0400) Resp:  [11-19] 14 (05/10 0400) BP: (136-167)/(61-84) 160/82 (05/10 0503) SpO2:  [93 %-100 %] 99 % (05/10 0400) Weight:  [179 lb 3.7 oz (81.3 kg)] 179 lb 3.7 oz (81.3 kg) (05/09 1036) Physical Exam: GEN: Lying in bed, much improved, NAD Head: normocephalic and atraumatic.  Face looks a little bit puffy Eyes: conjunctiva without injection. Sclera anicteric. CVS: Very difficult to auscultate with significant course breath sounds, 1+ edema on arms.  Some slow but persistent leaking around femcath site.  No indication of surrounding hematoma/bruising.   RESP: 4L [place], course rhonchi throughout GI: BS present and increased.  less Firm and Distended and no longer tender to palpation,no tympanic sounds. no skin discoloration.   MSK: no focal tenderness or wounding noted SKIN: no apparent skin lesion/rash NEURO: alert and oiented appropriately, no gross deficits.  Asterixis PSYCH: euthymic mood with congruent affect  Laboratory: Recent Labs  Lab 07/30/17 1357 07/31/17 0629 08/01/17 0305  WBC 14.4*  --  13.5*  HGB 7.6* 8.1* 8.2*  HCT 22.4* 23.9* 23.9*  PLT 77*  --  82*   Recent Labs  Lab 07/30/17 1357  07/31/17 0629 07/31/17 0935  08/01/17 0305  NA 130*   < > 132* 131* 136  K 6.7*   < > 6.6* 6.8* 4.6  CL 91*   < > 93* 93* 99*  CO2 17*   < > 17* 15* 21*  BUN 140*   < > 159* 161* 117*  CREATININE 9.65*   < > 10.08* 10.24* 8.26*  CALCIUM 7.0*   < > 6.5* 6.2* 6.7*  PROT 6.4*  --  6.1*  --  5.4*  BILITOT 1.2  --  0.8  --  0.7  ALKPHOS 94  --  104  --  120  ALT 2,996*  --  2,366*  --  1,783*  AST 1,408*  --  729*  --  362*  GLUCOSE 188*   < > 271* 256* 144*   < > = values in this interval not displayed.    Ferritin >7500  Imaging/Diagnostic Tests: Dg Chest 2 View  Result Date: 07/30/2017 CLINICAL DATA:  Abdominal pain and pressure. EXAM: CHEST - 2 VIEW COMPARISON:  12/29/2015 FINDINGS: Moderate cardiac enlargement status post prior CABG. Low bilateral lung volumes. There is no evidence of pulmonary edema, consolidation, pneumothorax, nodule or pleural fluid. The thoracic spine shows diffuse degenerative disc disease. IMPRESSION: Cardiac enlargement and low bilateral lung volumes. Electronically Signed   By: Aletta Edouard M.D.   On: 07/30/2017 15:28   US Abdomen Complete  Result Date: 07/30/2017 CLINICAL DATA:  Acute kidney injury,  elevated LFTs. Hepatic steatosis. EXAM: ABDOMEN ULTRASOUND COMPLETE COMPARISON:  Ultrasound the abdomen limited 12/30/2015. FINDINGS: Gallbladder: No gallstones or wall thickening visualized. No sonographic Murphy sign noted by sonographer. Common bile duct: Diameter: Normal measuring 4.8 mm Liver: Dense echotexture. No focal lesions identified. Portal vein is patent on color Doppler imaging with normal direction of blood flow towards the liver. IVC: No abnormality visualized. Pancreas: Visualized portion unremarkable. Spleen: Size and appearance within normal limits. Right Kidney: Length: 10.4 cm. Echogenicity within normal limits. No mass or hydronephrosis visualized. 2.5 cm cyst. Left Kidney: Length: 11.5 cm. Echogenicity within normal limits. No mass or hydronephrosis visualized. 2.0 cm  cyst. Abdominal aorta: No aneurysm visualized. Other findings: Trace ascites. IMPRESSION: Unremarkable ultrasound of the abdomen. No acute intra-abdominal findings. Uncomplicated appearing renal cystic disease without hydronephrosis or solid mass. Hepatic steatosis.  No gallstones or biliary ductal dilatation. Electronically Signed   By: Staci Righter M.D.   On: 07/30/2017 23:35     Sherene Sires, DO 08/01/2017, 6:22 AM PGY-1, [place] Intern pager: [phone], text pages welcome

## 2017-08-01 NOTE — Progress Notes (Signed)
   Daily Progress Note  Will check on patient on Monday.  Dr. Donzetta Matters will be covering for the service over the weekend.   Adele Barthel, MD, FACS [facility] Office: [phone] Pager: [phone]  08/01/2017, 9:42 AM

## 2017-08-02 LAB — CBC
HCT: 27.2 % — ABNORMAL LOW (ref 39.0–52.0)
HEMOGLOBIN: 9.2 g/dL — AB (ref 13.0–17.0)
MCH: 30.2 pg (ref 26.0–34.0)
MCHC: 33.8 g/dL (ref 30.0–36.0)
MCV: 89.2 fL (ref 78.0–100.0)
Platelets: 96 10*3/uL — ABNORMAL LOW (ref 150–400)
RBC: 3.05 MIL/uL — AB (ref 4.22–5.81)
RDW: 13.8 % (ref 11.5–15.5)
WBC: 13.9 10*3/uL — AB (ref 4.0–10.5)

## 2017-08-02 LAB — COMPREHENSIVE METABOLIC PANEL
ALBUMIN: 2.8 g/dL — AB (ref 3.5–5.0)
ALT: 1237 U/L — AB (ref 17–63)
ANION GAP: 14 (ref 5–15)
AST: 146 U/L — AB (ref 15–41)
Alkaline Phosphatase: 124 U/L (ref 38–126)
BUN: 75 mg/dL — ABNORMAL HIGH (ref 6–20)
CO2: 25 mmol/L (ref 22–32)
Calcium: 7.1 mg/dL — ABNORMAL LOW (ref 8.9–10.3)
Chloride: 97 mmol/L — ABNORMAL LOW (ref 101–111)
Creatinine, Ser: 5.33 mg/dL — ABNORMAL HIGH (ref 0.61–1.24)
GFR calc non Af Amer: 10 mL/min — ABNORMAL LOW (ref >60)
GFR, EST AFRICAN AMERICAN: 12 mL/min — AB (ref >60)
Glucose, Bld: 179 mg/dL — ABNORMAL HIGH (ref 65–99)
Potassium: 3.5 mmol/L (ref 3.5–5.1)
Sodium: 136 mmol/L (ref 135–145)
TOTAL PROTEIN: 5.7 g/dL — AB (ref 6.5–8.1)
Total Bilirubin: 0.6 mg/dL (ref 0.3–1.2)

## 2017-08-02 LAB — GLUCOSE, CAPILLARY
GLUCOSE-CAPILLARY: 128 mg/dL — AB (ref 65–99)
GLUCOSE-CAPILLARY: 276 mg/dL — AB (ref 65–99)
GLUCOSE-CAPILLARY: 172 mg/dL — AB (ref 65–99)
Glucose-Capillary: 175 mg/dL — ABNORMAL HIGH (ref 65–99)

## 2017-08-02 MED ORDER — OXYCODONE-ACETAMINOPHEN 5-325 MG PO TABS
1.0000 | ORAL_TABLET | Freq: Three times a day (TID) | ORAL | Status: DC | PRN
  Administered 2017-08-02 – 2017-08-09 (×16): 1 via ORAL
  Filled 2017-08-02 (×16): qty 1

## 2017-08-02 MED ORDER — POLYETHYLENE GLYCOL 3350 17 G PO PACK
17.0000 g | PACK | Freq: Every day | ORAL | Status: DC | PRN
Start: 2017-08-02 — End: 2017-08-09
  Administered 2017-08-08 – 2017-08-09 (×2): 17 g via ORAL
  Filled 2017-08-02 (×2): qty 1

## 2017-08-02 NOTE — Progress Notes (Signed)
[facility] ROUNDING NOTE   Subjective:   Interval History:   67 y.o.malepresenting with dyspnea and abdominal pain. PMH is significant forCAD status post CABG 2011, CKD 4, DM-2,hypertension, GERD, chronic back pain, secondary hyperparathyroidism, anemia of renal disease, gout, bradycardia.He has been followed by nephrology and has a baseline creatinine of about 3.8  This has increased to about 10 with a K that has increased to above 6  He was admitted with abdominal pain and dyspnea and a GI evaluation has been asked for. He has increased in his liver enzymes and an abdominal ultrasound has been requested. He has had a CABG in the past and cardiology has been asked to evaluate the patient. He underwent emergent dialysis for hyperkalemia 5/9  He underwent #2 dialysis 5/10  He has no complaints and is comfortable this morning  He has no oozing from his catheter site        Objective:  Vital signs in last 24 hours:  Temp:  [97.8 F (36.6 C)-99.7 F (37.6 C)] 98 F (36.7 C) (05/11 0738) Pulse Rate:  [65-78] 67 (05/11 0738) Resp:  [12-18] 18 (05/11 0738) BP: (150-198)/(68-95) 150/71 (05/11 0738) SpO2:  [93 %-99 %] 97 % (05/11 0738)  Weight change:  Filed Weights   07/31/17 0012 07/31/17 0500 07/31/17 1036  Weight: 177 lb 4 oz (80.4 kg) 177 lb 4 oz (80.4 kg) 179 lb 3.7 oz (81.3 kg)    Intake/Output: I/O last 3 completed shifts: In: 35 [P.O.:720; IV Piggyback:100] Out: 3810 [Urine:2700; FBPZW:2585]   Intake/Output this shift:  No intake/output data recorded. Awake and orientated CVS- RRR  No JVP  RS- CTA ABD- BS present soft non-distended   Dialysis catheter in Right Groin  Guaze clean  EXT- no edema   Basic Metabolic Panel: Recent Labs  Lab 07/30/17 1836 07/31/17 0629 07/31/17 0935 08/01/17 0305 08/02/17 0249  NA 133* 132* 131* 136 136  K 4.9 6.6* 6.8* 4.6 3.5  CL 91* 93* 93* 99* 97*  CO2 20* 17* 15* 21* 25  GLUCOSE 228* 271* 256* 144* 179*   BUN 146* 159* 161* 117* 75*  CREATININE 9.73* 10.08* 10.24* 8.26* 5.33*  CALCIUM 7.2* 6.5* 6.2* 6.7* 7.1*  PHOS 11.9*  --   --   --   --     Liver Function Tests: Recent Labs  Lab 07/30/17 1357 07/30/17 1836 07/31/17 0629 08/01/17 0305 08/02/17 0249  AST 1,408*  --  729* 362* 146*  ALT 2,996*  --  2,366* 1,783* 1,237*  ALKPHOS 94  --  104 120 124  BILITOT 1.2  --  0.8 0.7 0.6  PROT 6.4*  --  6.1* 5.4* 5.7*  ALBUMIN 3.4* 3.4* 3.2* 2.9* 2.8*   Recent Labs  Lab 08/01/17 0305  LIPASE 80*   Recent Labs  Lab 07/30/17 1838  AMMONIA 35    CBC: Recent Labs  Lab 07/30/17 1357 07/31/17 0629 08/01/17 0305 08/02/17 0249  WBC 14.4*  --  13.5* 13.9*  NEUTROABS 12.1*  --   --   --   HGB 7.6* 8.1* 8.2* 9.2*  HCT 22.4* 23.9* 23.9* 27.2*  MCV 88.9  --  89.2 89.2  PLT 77*  --  82* 96*    Cardiac Enzymes: Recent Labs  Lab 07/30/17 1836 07/30/17 2334 07/31/17 0935 07/31/17 1607 07/31/17 2208  TROPONINI 14.06* 11.58* 11.75* 11.03* 11.30*    BNP: Invalid input(s): POCBNP  CBG: Recent Labs  Lab 08/01/17 0734 08/01/17 1218 08/01/17 1619 08/01/17 2128 08/02/17  [place]    Microbiology: Results for orders placed or performed during the hospital encounter of 07/30/17  Culture, blood (routine x 2)     Status: None (Preliminary result)   Collection Time: 07/30/17  8:00 PM  Result Value Ref Range Status   Specimen Description BLOOD RIGHT HAND  Final   Special Requests   Final    BOTTLES DRAWN AEROBIC AND ANAEROBIC Blood Culture adequate volume   Culture   Final    NO GROWTH 2 DAYS Performed at [facility], [place] [address] [place], [place] [address]    Report Status PENDING  Incomplete  Culture, blood (routine x 2)     Status: None (Preliminary result)   Collection Time: 07/30/17  8:27 PM  Result Value Ref Range Status   Specimen Description BLOOD LEFT HAND  Final   Special Requests   Final    BOTTLES DRAWN AEROBIC AND  ANAEROBIC Blood Culture adequate volume   Culture   Final    NO GROWTH 2 DAYS Performed at [facility], [place] [address], [place], [place] [address]    Report Status PENDING  Incomplete  MRSA PCR Screening     Status: None   Collection Time: 07/31/17 12:21 AM  Result Value Ref Range Status   MRSA by PCR NEGATIVE NEGATIVE Final    Comment:        The GeneXpert MRSA Assay (FDA approved for NASAL specimens only), is one component of a comprehensive MRSA colonization surveillance program. It is not intended to diagnose MRSA infection nor to guide or monitor treatment for MRSA infections. Performed at [facility], [place] [address], [place], [place] [address]     Coagulation Studies: Recent Labs    07/30/17 1838  LABPROT 19.9*  INR 1.71    Urinalysis: Recent Labs    07/30/17 2222  COLORURINE YELLOW  LABSPEC 1.013  PHURINE 5.0  GLUCOSEU 150*  HGBUR MODERATE*  BILIRUBINUR NEGATIVE  KETONESUR NEGATIVE  PROTEINUR >=300*  NITRITE NEGATIVE  LEUKOCYTESUR NEGATIVE      Imaging: Ct Abdomen Pelvis W Contrast  Result Date: 08/01/2017 CLINICAL DATA:  Abdominal distention. EXAM: CT ABDOMEN AND PELVIS WITH CONTRAST TECHNIQUE: Multidetector CT imaging of the abdomen and pelvis was performed using the standard protocol following bolus administration of intravenous contrast. CONTRAST:  141mL OMNIPAQUE IOHEXOL 300 MG/ML  SOLN COMPARISON:  Complete abdomen ultrasound dated 07/30/2017. Abdomen and pelvis CT dated 12/26/2015. FINDINGS: Lower chest: Enlarged heart. Small right pleural effusion and minimal left pleural effusion. Mild bilateral lower lobe dependent atelectasis. Hepatobiliary: Small calcified granulomata in the liver. Mild diffuse low density of the liver. Normal appearing gallbladder. Pancreas: Unremarkable. No pancreatic ductal dilatation or surrounding inflammatory changes. Spleen: Normal in size without focal abnormality. Adrenals/Urinary Tract: Normal appearing  adrenal glands. Multiple bilateral renal cysts. Small amount of air in the urinary bladder. Otherwise, normal appearing urinary bladder and ureters. No urinary tract calculi or hydronephrosis. Stomach/Bowel: Stomach is within normal limits. Appendix appears normal. No evidence of bowel wall thickening, distention, or inflammatory changes. Vascular/Lymphatic: Atheromatous arterial calcifications without aneurysm. These include the coronary arteries. No enlarged lymph nodes. Reproductive: Mildly enlarged prostate gland. Other: No abdominal wall hernia or abnormality. No abdominopelvic ascites. Musculoskeletal: Lumbar and lower thoracic spine degenerative changes. IMPRESSION: 1. Small right pleural effusion and minimal left pleural effusion. 2. Mild bilateral lower lobe dependent atelectasis. 3. Cardiomegaly. 4. Mild diffuse hepatic steatosis. 5. Small amount of air  in the urinary bladder, most likely due to recent catheterization. 6.  Calcific coronary artery and aortic atherosclerosis. 7. Mild prostatic hypertrophy. Electronically Signed   By: Claudie Revering M.D.   On: 08/01/2017 21:31     Medications:   . sodium chloride    . cefTRIAXone (ROCEPHIN)  IV Stopped (08/02/17 0016)   . bisacodyl  10 mg Rectal q morning - 10a  . calcium acetate  2,001 mg Oral TID WC  . darbepoetin (ARANESP) injection - DIALYSIS  100 mcg Intravenous Q Sat-HD  . insulin aspart  0-9 Units Subcutaneous TID WC  . mouth rinse  15 mL Mouth Rinse BID   hydrALAZINE, ondansetron (ZOFRAN) IV  Assessment/ Plan:   Acute on chronic renal disease  This may just be progression of his renal failure  Stage 4 at baseline     Work up most likely diabetic nephropathy    Renal ultrasound showed no hydronephrosis and urinalysis showed proteinuria >300   Plan is to CLIP patient  He still has a temporary femoral dialysis catheter that will need to be removed once VVS can place a tunneled catheter. Vein mapping was already done 5/10  Hyperkalemia    resolved   Metabolic acidosis  improved   Anemia   increased ferritin   Would consider hemochromatosis  He underwent CT scan that had evidence of hepatic steatosis   He has been started on aranesp  Bones   Will check PTH  Combination of transaminitis and also increased ferritin would lead me to favor hemochromatosis as the diagnosis   Congestive heart failure  EF 30%  Increased in troponin          LOS: 3 Elena Cothern W @TODAY @9 :55 AM

## 2017-08-02 NOTE — Progress Notes (Signed)
Subjective: Abdominal fullness and distention improving.  Objective: Vital signs in last 24 hours: Temp:  [97.8 F (36.6 C)-99.7 F (37.6 C)] 98 F (36.7 C) (05/11 0738) Pulse Rate:  [65-78] 67 (05/11 0738) Resp:  [12-18] 18 (05/11 0738) BP: (150-198)/(68-95) 150/71 (05/11 0738) SpO2:  [93 %-99 %] 97 % (05/11 0738) Weight change:  Last BM Date: 08/01/17  PE: GEN:  NAD ABD:  Protuberant, soft.  Lab Results: CBC    Component Value Date/Time   WBC 13.9 (H) 08/02/2017 0249   RBC 3.05 (L) 08/02/2017 0249   HGB 9.2 (L) 08/02/2017 0249   HCT 27.2 (L) 08/02/2017 0249   PLT 96 (L) 08/02/2017 0249   MCV 89.2 08/02/2017 0249   MCH 30.2 08/02/2017 0249   MCHC 33.8 08/02/2017 0249   RDW 13.8 08/02/2017 0249   LYMPHSABS 0.9 07/30/2017 1357   MONOABS 1.4 (H) 07/30/2017 1357   EOSABS 0.0 07/30/2017 1357   BASOSABS 0.0 07/30/2017 1357   CMP     Component Value Date/Time   NA 136 08/02/2017 0249   NA 139 03/12/2017 1227   K 3.5 08/02/2017 0249   CL 97 (L) 08/02/2017 0249   CO2 25 08/02/2017 0249   GLUCOSE 179 (H) 08/02/2017 0249   BUN 75 (H) 08/02/2017 0249   BUN 62 (H) 03/12/2017 1227   CREATININE 5.33 (H) 08/02/2017 0249   CREATININE 2.81 (H) 02/19/2016 1459   CALCIUM 7.1 (L) 08/02/2017 0249   PROT 5.7 (L) 08/02/2017 0249   PROT 6.7 03/12/2017 1227   ALBUMIN 2.8 (L) 08/02/2017 0249   ALBUMIN 4.1 03/12/2017 1227   AST 146 (H) 08/02/2017 0249   ALT 1,237 (H) 08/02/2017 0249   ALKPHOS 124 08/02/2017 0249   BILITOT 0.6 08/02/2017 0249   BILITOT <0.2 03/12/2017 1227   GFRNONAA 10 (L) 08/02/2017 0249   GFRNONAA 22 (L) 02/19/2016 1459   GFRAA 12 (L) 08/02/2017 0249   GFRAA 26 (L) 02/19/2016 1459   Assessment:  1.  Abdominal pain and abdominal distention; no significant ascites and no obvious cirrhotic liver morphology on recent ultrasound or CT scan; no gallstones seen.  Could be from uremia, could be from liver etiology.  Had CT 2017 for pain/bloating, did show moderate  rectosigmoid stool burden. 2.  Elevated LFTs, but downtrending.  Unclear etiology; could be non Hep A/B acute viral hepatitis versus ischemic hepatitis versus medication effect versus other.  Hemochromatosis rarely causes such dramatic elevation in LFTs and significantly elevated ferritin is not unusual as acute phase reactant in face of hepatitis (or other inflammatory process) of any etiology, although increase iron saturation does increase our suspicion.  CT with hepatic steatosis, but no obvious hepatomegaly or portal venous thrombosis or hepatic vein thrombosis. 3.  Progressive renal failure, now on dialysis.  Plan:  1.  Follow LFT trend. 2.  Check more liver labs:  HFE studies for hereditary hemochromatosis; autoimmune labs. 3.  Patient's most pressing issue is his evolving endstage renal disease.  So long as liver enzymes continue to improve and symptoms improve, would not do any further GI/Hepatic work-up as inpatient; however, should symptoms worsen or should LFTs remain elevated as outpatient, would need to consider liver biopsy (if platelets remain low, would likely need transjugular liver biopsy). 4.  [facility] will revisit Monday.   Landry Dyke 08/02/2017, 10:34 AM   Cell [phone] If no answer or after 5 PM call [phone]

## 2017-08-02 NOTE — Progress Notes (Signed)
Dialysis treatment completed.  2500 mL ultrafiltrated.  2000 mL net fluid removal.  Patient status unchanged. Lung sounds diminished to ausculation in all fields. Generalized edema. Cardiac: NSR.  Cleansed R femoral catheter with chlorhexidine.  Disconnected lines and flushed ports with saline per protocol.  Ports locked with heparin and capped per protocol.    Report given to bedside, RN Santiago Glad.

## 2017-08-02 NOTE — Progress Notes (Signed)
Progress Note  Patient Name: Travers Goodley Date of Encounter: 08/02/2017  Primary Cardiologist: Dorris Carnes, MD   Subjective    Byard Carranza is a 67 y.o. 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 Nori Riis.  Echo cardiogram shows moderately reduced left ventricular systolic function with an EF of 35 to 40%.  He has mild pulmonary hypertension with an estimated PA pressure of 37.  Inpatient Medications    Scheduled Meds: . bisacodyl  10 mg Rectal q morning - 10a  . calcium acetate  2,001 mg Oral TID WC  . darbepoetin (ARANESP) injection - DIALYSIS  100 mcg Intravenous Q Sat-HD  . insulin aspart  0-9 Units Subcutaneous TID WC  . mouth rinse  15 mL Mouth Rinse BID   Continuous Infusions: . sodium chloride    . cefTRIAXone (ROCEPHIN)  IV Stopped (08/02/17 0016)   PRN Meds: hydrALAZINE, ondansetron (ZOFRAN) IV   Vital Signs    Vitals:   08/01/17 2134 08/01/17 2327 08/02/17 0438 08/02/17 0738  BP: (!) 175/86 (!) 157/85 (!) 174/88 (!) 150/71  Pulse: 76 70 72 67  Resp: 14 16 16 18   Temp: 97.9 F (36.6 C) 98.9 F (37.2 C) 98.5 F (36.9 C) 98 F (36.7 C)  TempSrc: Oral Oral Oral Oral  SpO2: 98% 98% 97% 97%  Weight:      Height:        Intake/Output Summary (Last 24 hours) at 08/02/2017 1048 Last data filed at 08/02/2017 0319 Gross per 24 hour  Intake 720 ml  Output 3919 ml  Net -3199 ml   Filed Weights   07/31/17 0012 07/31/17 0500 07/31/17 1036  Weight: 177 lb 4 oz (80.4 kg) 177 lb 4 oz (80.4 kg) 179 lb 3.7 oz (81.3 kg)    Telemetry    NSR  - Personally Reviewed  ECG     NSR  - Personally Reviewed  Physical Exam   Physical Exam: Blood pressure (!) 150/71, pulse 67, temperature 98 F (36.7 C), temperature source Oral, resp. rate 18,  height 5\' 4"  (1.626 m), weight 179 lb 3.7 oz (81.3 kg), SpO2 97 %.  GEN:  Well nourished, well developed in no acute distress HEENT: Normal NECK: No JVD; No carotid bruits LYMPHATICS: No lymphadenopathy CARDIAC:    RESPIRATORY:  Clear to auscultation without rales, wheezing or rhonchi  ABDOMEN: Soft, non-tender, non-distended MUSCULOSKELETAL:  No edema; No deformity  SKIN: Warm and dry NEUROLOGIC:  Alert and oriented x 3   Labs    Chemistry Recent Labs  Lab 07/31/17 0629 07/31/17 0935 08/01/17 0305 08/02/17 0249  NA 132* 131* 136 136  K 6.6* 6.8* 4.6 3.5  CL 93* 93* 99* 97*  CO2 17* 15* 21* 25  GLUCOSE 271* 256* 144* 179*  BUN 159* 161* 117* 75*  CREATININE 10.08* 10.24* 8.26* 5.33*  CALCIUM 6.5* 6.2* 6.7* 7.1*  PROT 6.1*  --  5.4* 5.7*  ALBUMIN 3.2*  --  2.9* 2.8*  AST 729*  --  362* 146*  ALT 2,366*  --  1,783* 1,237*  ALKPHOS 104  --  120 124  BILITOT 0.8  --  0.7 0.6  GFRNONAA 5* 5* 6* 10*  GFRAA 5* 5* 7* 12*  ANIONGAP 22* 23* 16* 14     Hematology  Recent Labs  Lab 07/30/17 1357 07/30/17 1838 07/31/17 0629 08/01/17 0305 08/02/17 0249  WBC 14.4*  --   --  13.5* 13.9*  RBC 2.52* 2.44*  --  2.68* 3.05*  HGB 7.6*  --  8.1* 8.2* 9.2*  HCT 22.4*  --  23.9* 23.9* 27.2*  MCV 88.9  --   --  89.2 89.2  MCH 30.2  --   --  30.6 30.2  MCHC 33.9  --   --  34.3 33.8  RDW 13.8  --   --  14.1 13.8  PLT 77*  --   --  82* 96*    Cardiac Enzymes Recent Labs  Lab 07/30/17 2334 07/31/17 0935 07/31/17 1607 07/31/17 2208  TROPONINI 11.58* 11.75* 11.03* 11.30*   No results for input(s): TROPIPOC in the last 168 hours.   BNP Recent Labs  Lab 07/30/17 1358  BNP 4,146.3*     DDimer No results for input(s): DDIMER in the last 168 hours.   Radiology    Ct Abdomen Pelvis W Contrast  Result Date: 08/01/2017 CLINICAL DATA:  Abdominal distention. EXAM: CT ABDOMEN AND PELVIS WITH CONTRAST TECHNIQUE: Multidetector CT imaging of the abdomen and pelvis was performed  using the standard protocol following bolus administration of intravenous contrast. CONTRAST:  143mL OMNIPAQUE IOHEXOL 300 MG/ML  SOLN COMPARISON:  Complete abdomen ultrasound dated 07/30/2017. Abdomen and pelvis CT dated 12/26/2015. FINDINGS: Lower chest: Enlarged heart. Small right pleural effusion and minimal left pleural effusion. Mild bilateral lower lobe dependent atelectasis. Hepatobiliary: Small calcified granulomata in the liver. Mild diffuse low density of the liver. Normal appearing gallbladder. Pancreas: Unremarkable. No pancreatic ductal dilatation or surrounding inflammatory changes. Spleen: Normal in size without focal abnormality. Adrenals/Urinary Tract: Normal appearing adrenal glands. Multiple bilateral renal cysts. Small amount of air in the urinary bladder. Otherwise, normal appearing urinary bladder and ureters. No urinary tract calculi or hydronephrosis. Stomach/Bowel: Stomach is within normal limits. Appendix appears normal. No evidence of bowel wall thickening, distention, or inflammatory changes. Vascular/Lymphatic: Atheromatous arterial calcifications without aneurysm. These include the coronary arteries. No enlarged lymph nodes. Reproductive: Mildly enlarged prostate gland. Other: No abdominal wall hernia or abnormality. No abdominopelvic ascites. Musculoskeletal: Lumbar and lower thoracic spine degenerative changes. IMPRESSION: 1. Small right pleural effusion and minimal left pleural effusion. 2. Mild bilateral lower lobe dependent atelectasis. 3. Cardiomegaly. 4. Mild diffuse hepatic steatosis. 5. Small amount of air in the urinary bladder, most likely due to recent catheterization. 6.  Calcific coronary artery and aortic atherosclerosis. 7. Mild prostatic hypertrophy. Electronically Signed   By: Claudie Revering M.D.   On: 08/01/2017 21:31    Cardiac Studies     Patient Profile     67 y.o. male with acute renal insufficiency , hx of CAD and + troponins  Assessment & Plan    1.   Troponin elevation: His troponins remained around 11.  This is very elevated but the trend is flat.  Is not consistent with an acute coronary syndrome.  Suspect that this is more due to his congestive heart failure/renal failure.  He is not having any episodes of angina.  We should wait until he is more stable before pursuing ischemic work-up.  2.  Coronary artery disease: He has a history of coronary artery bypass grafting.  He is had normal left ventricular systolic function in the past.  Echocardiogram from yesterday reveals diffuse left ventricular hypokinesis with an ejection fraction of 35 to 40%.  We are not able to assess his  diastolic function.  He has mild mitral regurgitation.  Mild pulmonary hypertension with an estimated PA pressure of 37.  3.  Chronic combined systolic and diastolic heart failure: Ejection fraction is 35 to 40%.  We were not able to evaluate diastolic dysfunction.Marland Kitchen He is thought to have hemochromatosis.  This may be contributing to his CHF as well      For questions or updates, please contact [place] consult [email or web address] for contact info under Cardiology/STEMI.      Signed, Mertie Moores, MD  08/02/2017, 10:48 AM

## 2017-08-02 NOTE — Progress Notes (Addendum)
Family Medicine Teaching Service Daily Progress Note Intern Pager: [phone]  Patient name: Joseph Mcdonald Medical record number: [id number] Date of birth: 05-26-1950 Age: 67 y.o. Gender: male  Primary Care Provider: Mercy Riding, MD Consultants: Nephrology, Cardiology Code Status: Full  Pt Overview and Major Events to Date:  5/08 Admitted 5/09 HD #1 5/10 VVS vein mapping, CT abd/pelvis neg 5/11 HD #2, on room air  Assessment and Plan: Joseph Mcdonald is a 67 y.o. male presenting with dyspnea and abdominal pain. PMH is significant for CAD status post CABG 2011, CKD 4, DM-2, hypertension, GERD, chronic back pain, secondary hyperparathyroidism, anemia of renal disease, gout, bradycardia.   1.  Shortness of breath  New onset HFrEF: Resolved.  Currently on room air.  Thought to be related to fluid overload in the setting of HF exacerbation given worsening EF 30% down from prior, 55-60%.  Currently off diuretics.  Patient has now progressed to ARF and will likely not improve.  He is currently awaiting AVF formation. - Nephrology consulted, appreciate recommendations - Cardiology consulted, appreciate recommendations - Daily weights, I&O's - Telemetry - Plan to continue HD 5/11, s/p HD 5/9  2.  Abdominal pain  Elevated transaminase: Improving.  Patient had bowel movement on 5/10 since advancing diet to clear liquids.  GGT elevated.  Hepatic panel negative.  Could be related to congestive hepatopathy.  CT abdomen and pelvis unremarkable for GI pathology with exception to mild hepatic steatosis proven on prior RUQ ultrasound.  Transaminase improving. - GI consulted, appreciate recommendations - Plan to continue current plan per above with HD and advance diet  3.  Anemia of chronic inflammation  Thrombocytopenia  Iron overload: Improving.  Hemoglobin up, 9.2.  Platelets up 96k.  Currently has femoral cath for HD access at present.  No signs of active bleeding.  Does have  signs of iron overload with high ferritin >7,500.  Possible hemochromatosis.  GI following. - Continue monitoring CBC  4.  Coronary arterial disease: History of CABG in 2011.  Remains without chest pain or dyspnea.  Troponins have plateaued at 11.  Cardiology is following.  Echo showing new onset HFrEF as mentioned above. - Cardiology consulted, appreciate recommendations - Telemetry - Potential heart catheterization per cardiology recommendation later this week  5.  Acute on chronic CKD 4  Hyperkalemia  Anion gap metabolic acidosis: Has now progressed to acute renal failure and will likely need lifelong dialysis.  VVS has performed vein mapping and plans for AVF in the immediate future.  Patient currently has femoral cath for HD access.  Hyperkalemia and anion gap have resolved following HD on 5/9 with plans for second treatment 5/11. - Nephrology consulted, appreciate recommendations - Plan per problem #1  6.  Diabetes mellitus type 2: Chronic.  A1c 7.2 approximately 1 month ago.  Takes Lantus and Victoza at home.  CBGs remain 100s. - Sensitive sliding scale with HS coverage - CBGs AC/HS  7.  Hypertension: Chronic.  Likely uncontrolled in the setting of fluid overload.  Improved following HD.  Plan to continue HD with hydralazine as needed - Continue hydralazine PRN SBP >160 or DBP>110  FEN/GI: Soft, ADAT PPx: SCDs   Disposition:  Continue HD inpatient plans for AVF by VVS.  Cardiology with plans to perform heart cath likely this week with improvement of fluid overload state.  Subjective:  Patient states he feels better today.  Denies shortness of breath or chest pain.  Endorses bowel movement yesterday and is hopeful to  eat more solid foods later today.  Objective: Temp:  [97.8 F (36.6 C)-99.7 F (37.6 C)] 98 F (36.7 C) (05/11 0738) Pulse Rate:  [65-78] 67 (05/11 0738) Resp:  [12-18] 18 (05/11 0738) BP: (150-198)/(68-95) 150/71 (05/11 0738) SpO2:  [93 %-99 %] 97 % (05/11  0738) Physical Exam: General: lying in bed, well nourished, well developed, NAD with non-toxic appearance HEENT: normocephalic, atraumatic, moist mucous membranes Neck: supple, no JVD Cardiovascular: regular rate and rhythm without murmurs, rubs, or gallops, femoral cath intact with minimal bleeding and without fluctuance Lungs: clear to auscultation bilaterally with normal work of breathing on room air Abdomen: soft, non-tender, non-distended, normoactive bowel sounds Skin: warm, dry, no rashes or lesions, cap refill < 2 seconds Extremities: warm and well perfused, normal tone, no edema  Laboratory: Recent Labs  Lab 07/30/17 1357 07/31/17 0629 08/01/17 0305 08/02/17 0249  WBC 14.4*  --  13.5* 13.9*  HGB 7.6* 8.1* 8.2* 9.2*  HCT 22.4* 23.9* 23.9* 27.2*  PLT 77*  --  82* 96*   Recent Labs  Lab 07/31/17 0629 07/31/17 0935 08/01/17 0305 08/02/17 0249  NA 132* 131* 136 136  K 6.6* 6.8* 4.6 3.5  CL 93* 93* 99* 97*  CO2 17* 15* 21* 25  BUN 159* 161* 117* 75*  CREATININE 10.08* 10.24* 8.26* 5.33*  CALCIUM 6.5* 6.2* 6.7* 7.1*  PROT 6.1*  --  5.4* 5.7*  BILITOT 0.8  --  0.7 0.6  ALKPHOS 104  --  120 124  ALT 2,366*  --  1,783* 1,237*  AST 729*  --  362* 146*  GLUCOSE 271* 256* 144* 179*   5/10 GGT: 256 5/10 Lipase: 80 < 89 5/10 FOBT: Positive 5/09 Troponin I: 11.3 <11.0 <11.75 <11.58 5/09 HBV panel: Negative 5/09 Ferritin: >7,500 5/08 UA: Moderate hemoglobin, 150 glucose, >300 protein 5/08 Blood cultures x2: NGTD 5/08 Ethanol: Negative 5/08 Acetaminophen level: Negative 5/08 Ammonia: 35 5/08 Reticulocytes: 2.44 5/08 Iron studies: Iron 230, TIBC 238, saturation 97% 5/08 Folate: 11.7 5/08 B12: 4551 5/08 Hepatitis panel: Negative 5/08 INR: 1.71 5/08 BNP: 4146 (1057, 1 year ago)  Imaging/Diagnostic Tests: CT ABDOMEN PELVIS W CONTRAST (08/01/2017) 1. Small right pleural effusion and minimal left pleural effusion. 2. Mild bilateral lower lobe dependent  atelectasis. 3. Cardiomegaly. 4. Mild diffuse hepatic steatosis. 5. Small amount of air in the urinary bladder, most likely due to recent catheterization. 6.  Calcific coronary artery and aortic atherosclerosis. 7. Mild prostatic hypertrophy.  ABDOMEN ULTRASOUND COMPLETE (07/30/2017) Unremarkable ultrasound of the abdomen. No acute intra-abdominal findings. Uncomplicated appearing renal cystic disease without hydronephrosis or solid mass. Hepatic steatosis.  No gallstones or biliary ductal dilatation.  CHEST - 2 (07/30/2017) Cardiac enlargement and low bilateral lung volumes.    Reading Bing, DO 08/02/2017, 8:03 AM PGY-2, [place] Intern pager: [phone], text pages welcome

## 2017-08-02 NOTE — Progress Notes (Addendum)
Pt c/o chronic back pain and soreness, no PRNs available, patient repositioned for comfort and MD paged for new orders.   1701: no urine output charted for day, bladder scan reveals greater than 837ml, patient attempt to urinate via urinal with no success, MD paged for new orders.

## 2017-08-02 NOTE — Progress Notes (Signed)
Patient arrived to unit by bed.  Reviewed treatment plan and this RN agrees with plan.  Report received from bedside RN, Anderson Malta.  Consent verified.  Patient A & O X 4.   Lung sounds diminished to ausculation in all fields. BLE +1 pitting edema. Cardiac:  SB.  Removed caps and cleansed R femoral catheter with chlorhedxidine.  Aspirated ports of heparin and flushed them with saline per protocol.  Connected and secured lines, initiated treatment at 1820.  UF Goal of 2500 mL and net fluid removal 2 L.  Will continue to monitor.

## 2017-08-03 ENCOUNTER — Other Ambulatory Visit: Payer: Self-pay

## 2017-08-03 DIAGNOSIS — N179 Acute kidney failure, unspecified: Principal | ICD-10-CM

## 2017-08-03 DIAGNOSIS — R740 Nonspecific elevation of levels of transaminase and lactic acid dehydrogenase [LDH]: Secondary | ICD-10-CM

## 2017-08-03 LAB — RENAL FUNCTION PANEL
ALBUMIN: 2.7 g/dL — AB (ref 3.5–5.0)
Anion gap: 13 (ref 5–15)
BUN: 46 mg/dL — AB (ref 6–20)
CO2: 27 mmol/L (ref 22–32)
CREATININE: 3.83 mg/dL — AB (ref 0.61–1.24)
Calcium: 7.3 mg/dL — ABNORMAL LOW (ref 8.9–10.3)
Chloride: 98 mmol/L — ABNORMAL LOW (ref 101–111)
GFR calc Af Amer: 17 mL/min — ABNORMAL LOW (ref >60)
GFR, EST NON AFRICAN AMERICAN: 15 mL/min — AB (ref >60)
Glucose, Bld: 170 mg/dL — ABNORMAL HIGH (ref 65–99)
PHOSPHORUS: 3.5 mg/dL (ref 2.5–4.6)
POTASSIUM: 3.4 mmol/L — AB (ref 3.5–5.1)
Sodium: 138 mmol/L (ref 135–145)

## 2017-08-03 LAB — CBC
HEMATOCRIT: 29.6 % — AB (ref 39.0–52.0)
Hemoglobin: 10 g/dL — ABNORMAL LOW (ref 13.0–17.0)
MCH: 30.2 pg (ref 26.0–34.0)
MCHC: 33.8 g/dL (ref 30.0–36.0)
MCV: 89.4 fL (ref 78.0–100.0)
Platelets: 112 10*3/uL — ABNORMAL LOW (ref 150–400)
RBC: 3.31 MIL/uL — AB (ref 4.22–5.81)
RDW: 13.7 % (ref 11.5–15.5)
WBC: 14.2 10*3/uL — AB (ref 4.0–10.5)

## 2017-08-03 LAB — IGG, IGA, IGM
IGA: 264 mg/dL (ref 61–437)
IGG (IMMUNOGLOBIN G), SERUM: 585 mg/dL — AB (ref 700–1600)
IgM (Immunoglobulin M), Srm: 48 mg/dL (ref 20–172)

## 2017-08-03 LAB — GLUCOSE, CAPILLARY
GLUCOSE-CAPILLARY: 167 mg/dL — AB (ref 65–99)
GLUCOSE-CAPILLARY: 302 mg/dL — AB (ref 65–99)
GLUCOSE-CAPILLARY: 270 mg/dL — AB (ref 65–99)
Glucose-Capillary: 301 mg/dL — ABNORMAL HIGH (ref 65–99)
Glucose-Capillary: 243 mg/dL — ABNORMAL HIGH (ref 65–99)

## 2017-08-03 LAB — CERULOPLASMIN: Ceruloplasmin: 29.7 mg/dL (ref 16.0–31.0)

## 2017-08-03 LAB — ANTI-SMOOTH MUSCLE ANTIBODY, IGG: F-Actin IgG: 10 Units (ref 0–19)

## 2017-08-03 LAB — MITOCHONDRIAL ANTIBODIES: Mitochondrial M2 Ab, IgG: <20 Units (ref 0.0–20.0)

## 2017-08-03 LAB — PARATHYROID HORMONE, INTACT (NO CA): PTH: 121 pg/mL — ABNORMAL HIGH (ref 15–65)

## 2017-08-03 MED ORDER — MUPIROCIN 2 % EX OINT: 1.0000 "application " | TOPICAL_OINTMENT | Freq: Two times a day (BID) | CUTANEOUS | Status: DC

## 2017-08-03 MED ORDER — CHLORHEXIDINE GLUCONATE CLOTH 2 % EX PADS
6.0000 | MEDICATED_PAD | Freq: Every day | CUTANEOUS | Status: DC
  Administered 2017-08-03: 6 via TOPICAL

## 2017-08-03 MED ORDER — TAMSULOSIN HCL 0.4 MG PO CAPS
0.4000 mg | ORAL_CAPSULE | Freq: Every day | ORAL | Status: DC
  Administered 2017-08-03 – 2017-08-09 (×5): 0.4 mg via ORAL
  Filled 2017-08-03 (×5): qty 1

## 2017-08-03 MED ORDER — HYDRALAZINE HCL 10 MG PO TABS
10.0000 mg | ORAL_TABLET | Freq: Three times a day (TID) | ORAL | Status: DC
  Administered 2017-08-03 – 2017-08-06 (×9): 10 mg via ORAL
  Filled 2017-08-03 (×9): qty 1

## 2017-08-03 NOTE — Progress Notes (Signed)
08/03/2017  Patient was bladder scan at 1817 had 536 cc in bladder. Patient eventually voided 500cc at 1825. Family medicine was made aware. Rico Sheehan RN

## 2017-08-03 NOTE — Progress Notes (Signed)
[facility] ROUNDING NOTE   Subjective:   Interval History:   67 y.o.malepresenting with dyspnea and abdominal pain. PMH is significant forCAD status post CABG 2011, CKD 4, DM-2,hypertension, GERD, chronic back pain, secondary hyperparathyroidism, anemia of renal disease, gout, bradycardia.He has been followed by nephrology and has a baseline creatinine of about 3.8  This has increased to about 10 with a K that has increased to above 6  He was admitted with abdominal pain and dyspnea and a GI evaluation has been asked for. He has increased in his liver enzymes and an abdominal ultrasound has been requested. He has had a CABG in the past and cardiology has been asked to evaluate the patient. He underwent emergent dialysis for hyperkalemia 5/9  He underwent #3 dialysis 5/11  He has no complaints and is comfortable this morning   He is asking about when he will be able to go home   Catheter site not oozing        Objective:  Vital signs in last 24 hours:  Temp:  [97.9 F (36.6 C)-99.4 F (37.4 C)] 99.4 F (37.4 C) (05/12 0754) Pulse Rate:  [51-71] 70 (05/12 0754) Resp:  [14-19] 19 (05/12 0754) BP: (146-175)/(65-87) 168/77 (05/12 0754) SpO2:  [95 %-98 %] 97 % (05/12 0754) Weight:  [157 lb 3 oz (71.3 kg)-162 lb 4.1 oz (73.6 kg)] 157 lb 3 oz (71.3 kg) (05/12 0500)  Weight change:  Filed Weights   08/02/17 1810 08/02/17 2050 08/03/17 0500  Weight: 162 lb 4.1 oz (73.6 kg) 157 lb 13.6 oz (71.6 kg) 157 lb 3 oz (71.3 kg)    Intake/Output: I/O last 3 completed shifts: In: 2000 [P.O.:1800; IV Piggyback:200] Out: 8938 [Urine:2550; BOFBP:1025]   Intake/Output this shift:  No intake/output data recorded. Awake and orientated CVS- RRR  No JVP  RS- CTA ABD- BS present soft non-distended   Dialysis catheter in Right Groin  Guaze clean  EXT- no edema   Basic Metabolic Panel: Recent Labs  Lab 07/30/17 1836 07/31/17 0629 07/31/17 0935 08/01/17 0305 08/02/17 0249  08/03/17 0221  NA 133* 132* 131* 136 136 138  K 4.9 6.6* 6.8* 4.6 3.5 3.4*  CL 91* 93* 93* 99* 97* 98*  CO2 20* 17* 15* 21* 25 27  GLUCOSE 228* 271* 256* 144* 179* 170*  BUN 146* 159* 161* 117* 75* 46*  CREATININE 9.73* 10.08* 10.24* 8.26* 5.33* 3.83*  CALCIUM 7.2* 6.5* 6.2* 6.7* 7.1* 7.3*  PHOS 11.9*  --   --   --   --  3.5    Liver Function Tests: Recent Labs  Lab 07/30/17 1357 07/30/17 1836 07/31/17 0629 08/01/17 0305 08/02/17 0249 08/03/17 0221  AST 1,408*  --  729* 362* 146*  --   ALT 2,996*  --  2,366* 1,783* 1,237*  --   ALKPHOS 94  --  104 120 124  --   BILITOT 1.2  --  0.8 0.7 0.6  --   PROT 6.4*  --  6.1* 5.4* 5.7*  --   ALBUMIN 3.4* 3.4* 3.2* 2.9* 2.8* 2.7*   Recent Labs  Lab 08/01/17 0305  LIPASE 80*   Recent Labs  Lab 07/30/17 1838  AMMONIA 35    CBC: Recent Labs  Lab 07/30/17 1357 07/31/17 0629 08/01/17 0305 08/02/17 0249 08/03/17 0221  WBC 14.4*  --  13.5* 13.9* 14.2*  NEUTROABS 12.1*  --   --   --   --   HGB 7.6* 8.1* 8.2* 9.2* 10.0*  HCT  22.4* 23.9* 23.9* 27.2* 29.6*  MCV 88.9  --  89.2 89.2 89.4  PLT 77*  --  82* 96* 112*    Cardiac Enzymes: Recent Labs  Lab 07/30/17 1836 07/30/17 2334 07/31/17 0935 07/31/17 1607 07/31/17 2208  TROPONINI 14.06* 11.58* 11.75* 11.03* 11.30*    BNP: Invalid input(s): POCBNP  CBG: Recent Labs  Lab 08/02/17 0737 08/02/17 1132 08/02/17 1548 08/02/17 2143 08/03/17 0726  GLUCAP 128* 276* 175* 172* 167*    Microbiology: Results for orders placed or performed during the hospital encounter of 07/30/17  Culture, blood (routine x 2)     Status: None (Preliminary result)   Collection Time: 07/30/17  8:00 PM  Result Value Ref Range Status   Specimen Description BLOOD RIGHT HAND  Final   Special Requests   Final    BOTTLES DRAWN AEROBIC AND ANAEROBIC Blood Culture adequate volume   Culture   Final    NO GROWTH 3 DAYS Performed at [facility], [place] [address], [place], [place]  [address]    Report Status PENDING  Incomplete  Culture, blood (routine x 2)     Status: None (Preliminary result)   Collection Time: 07/30/17  8:27 PM  Result Value Ref Range Status   Specimen Description BLOOD LEFT HAND  Final   Special Requests   Final    BOTTLES DRAWN AEROBIC AND ANAEROBIC Blood Culture adequate volume   Culture   Final    NO GROWTH 3 DAYS Performed at [facility], [place] [address], [place], [place] [address]    Report Status PENDING  Incomplete  MRSA PCR Screening     Status: None   Collection Time: 07/31/17 12:21 AM  Result Value Ref Range Status   MRSA by PCR NEGATIVE NEGATIVE Final    Comment:        The GeneXpert MRSA Assay (FDA approved for NASAL specimens only), is one component of a comprehensive MRSA colonization surveillance program. It is not intended to diagnose MRSA infection nor to guide or monitor treatment for MRSA infections. Performed at [facility], [place] [address] [place], [place] [address]     Coagulation Studies: No results for input(s): LABPROT, INR in the last 72 hours.  Urinalysis: No results for input(s): COLORURINE, LABSPEC, PHURINE, GLUCOSEU, HGBUR, BILIRUBINUR, KETONESUR, PROTEINUR, UROBILINOGEN, NITRITE, LEUKOCYTESUR in the last 72 hours.  Invalid input(s): APPERANCEUR    Imaging: Ct Abdomen Pelvis W Contrast  Result Date: 08/01/2017 CLINICAL DATA:  Abdominal distention. EXAM: CT ABDOMEN AND PELVIS WITH CONTRAST TECHNIQUE: Multidetector CT imaging of the abdomen and pelvis was performed using the standard protocol following bolus administration of intravenous contrast. CONTRAST:  177mL OMNIPAQUE IOHEXOL 300 MG/ML  SOLN COMPARISON:  Complete abdomen ultrasound dated 07/30/2017. Abdomen and pelvis CT dated 12/26/2015. FINDINGS: Lower chest: Enlarged heart. Small right pleural effusion and minimal left pleural effusion. Mild bilateral lower lobe dependent atelectasis. Hepatobiliary: Small calcified granulomata in the  liver. Mild diffuse low density of the liver. Normal appearing gallbladder. Pancreas: Unremarkable. No pancreatic ductal dilatation or surrounding inflammatory changes. Spleen: Normal in size without focal abnormality. Adrenals/Urinary Tract: Normal appearing adrenal glands. Multiple bilateral renal cysts. Small amount of air in the urinary bladder. Otherwise, normal appearing urinary bladder and ureters. No urinary tract calculi or hydronephrosis. Stomach/Bowel: Stomach is within normal limits. Appendix appears normal. No evidence of bowel wall thickening, distention, or inflammatory changes. Vascular/Lymphatic: Atheromatous arterial calcifications without aneurysm. These include the coronary arteries. No enlarged lymph nodes. Reproductive:  Mildly enlarged prostate gland. Other: No abdominal wall hernia or abnormality. No abdominopelvic ascites. Musculoskeletal: Lumbar and lower thoracic spine degenerative changes. IMPRESSION: 1. Small right pleural effusion and minimal left pleural effusion. 2. Mild bilateral lower lobe dependent atelectasis. 3. Cardiomegaly. 4. Mild diffuse hepatic steatosis. 5. Small amount of air in the urinary bladder, most likely due to recent catheterization. 6.  Calcific coronary artery and aortic atherosclerosis. 7. Mild prostatic hypertrophy. Electronically Signed   By: Claudie Revering M.D.   On: 08/01/2017 21:31     Medications:   . sodium chloride    . cefTRIAXone (ROCEPHIN)  IV Stopped (08/02/17 2221)   . bisacodyl  10 mg Rectal q morning - 10a  . calcium acetate  2,001 mg Oral TID WC  . Chlorhexidine Gluconate Cloth  6 each Topical Q0600  . darbepoetin (ARANESP) injection - DIALYSIS  100 mcg Intravenous Q Sat-HD  . insulin aspart  0-9 Units Subcutaneous TID WC  . mouth rinse  15 mL Mouth Rinse BID  . mupirocin ointment  1 application Nasal BID  . tamsulosin  0.4 mg Oral QPC breakfast   hydrALAZINE, ondansetron (ZOFRAN) IV, oxyCODONE-acetaminophen, polyethylene  glycol  Assessment/ Plan:   Acute on chronic renal disease  This may just be progression of his renal failure  Stage 4 at baseline     Work up most likely diabetic nephropathy    Renal ultrasound showed no hydronephrosis and urinalysis showed proteinuria >300   Plan is to CLIP patient  He still has a temporary femoral dialysis catheter ( placed 5/9) that will need to be removed once VVS can place a tunneled catheter ( seen by Dr Bridgett Larsson 5/10). Vein mapping was already done 5/10.  Plan dialysis #4 in AM   Hyperkalemia   resolved   K slight decreased today   Metabolic acidosis  improved   Anemia   increased ferritin   Would consider hemochromatosis  He underwent CT scan that had evidence of hepatic steatosis   He has been started on aranesp.  Bones    PTH pending   Combination of transaminitis and also increased ferritin would lead me to favor hemochromatosis as the diagnosis   HFE studies per GI   Congestive heart failure  EF 30%  Increased in troponin  Appreciate input from Dr Shelbie Ammons          LOS: 4 Haly Feher W @TODAY @9 :37 AM

## 2017-08-03 NOTE — Progress Notes (Signed)
Family Medicine Teaching Service Daily Progress Note Intern Pager: [phone]  Patient name: Joseph Mcdonald Medical record number: [id number] Date of birth: Apr 25, 1950 Age: 67 y.o. Gender: male  Primary Care Provider: Mercy Riding, MD Consultants: Nephrology, Cardiology Code Status: Full  Pt Overview and Major Events to Date:  5/08 Admitted 5/09 HD #1 5/10 VVS vein mapping, CT abd/pelvis neg 5/11 HD #2, on room air  Assessment and Plan: Joseph Mcdonald is a 67 y.o. male presenting with dyspnea and abdominal pain. PMH is significant for CAD status post CABG 2011, CKD 4, DM-2, hypertension, GERD, chronic back pain, secondary hyperparathyroidism, anemia of renal disease, gout, bradycardia.   1.  Shortness of breath  New onset HFrEF: Resolved.  Remains on room air.  Thought to be related to fluid overload in the setting of HF exacerbation given worsening EF 30% down from prior, 55-60%.  Currently off diuretics.  Patient has now progressed to ARF and will likely not improve.  Currently awaiting AVF esatlishment. -Nephrology consulted, appreciate recommendations, s/p HD 5/9 and 5/11 -Cardiology consulted, appreciate recommendations -Daily weights, I&O's -Telemetry  2.  Abdominal pain  Elevated transaminase: Improving.  Patient continues to have bowel movements.  Hepatic panel negative.  GI consulted considering hemochromatosis work-up given negative CT abdomen and pelvis and right upper quadrant ultrasound.  Transaminases improving. - GI consulted, appreciate recommendations - Advancing diet to renal diet  3.  Anemia of chronic inflammation  Thrombocytopenia  Iron overload: Improving.  Hemoglobin and platelets stable.  His femoral cath for HD access.  Does have signs of iron overload.  Possibly hemochromatosis.  GI following. - Continue monitoring CBC  4.  Coronary arterial disease: History of CABG in 2011.  No signs of ACS on exam.  Troponins plateaued to 11.   Cardiology following. - Plan per problem #1 - Potential heart catheterization per cardiology later this week  5.  Acute on chronic CKD 4  Hyperkalemia  Anion gap metabolic acidosis: Hyperkalemia and metabolic acidosis resolved following HD.  Has progressed to ESRD.  Nephrology currently working on obtaining permanent access. - Plan per problem #1  6.  Diabetes mellitus type 2: Chronic.  A1c 7.2.  Takes Lantus and Victoza at home.  CBGs in the 100s. -Sensitive sliding scale with at bedtime coverage  7.  Hypertension: Chronic.  Improved following HD. - Continue hydralazine as needed  8.  Urinary retention: Likely related to BPH indicated on CT abdomen and pelvis. - Initiating Flomax - Foley cath in and out as needed following bladder scans  FEN/GI: Renal diet PPx: SCDs   Disposition:  Plan to continue HD and pursue fistula access.  Possible heart cath later this week.  Subjective:  Patient states he has had difficulty urinating without assistance with Foley.  He is otherwise without complaints.  Objective: Temp:  [97.9 F (36.6 C)-98.5 F (36.9 C)] 98.3 F (36.8 C) (05/12 0452) Pulse Rate:  [51-71] 65 (05/12 0600) Resp:  [14-18] 17 (05/12 0600) BP: (146-175)/(65-87) 156/76 (05/12 0600) SpO2:  [95 %-98 %] 97 % (05/12 0600) Weight:  [157 lb 3 oz (71.3 kg)-162 lb 4.1 oz (73.6 kg)] 157 lb 3 oz (71.3 kg) (05/12 0500) Physical Exam: General: lying in bed, well nourished, well developed, NAD with non-toxic appearance HEENT: normocephalic, atraumatic, moist mucous membranes Neck: supple, non-tender without lymphadenopathy Cardiovascular: regular rate and rhythm without murmurs, rubs, or gallops, femoral cath intact Lungs: clear to auscultation bilaterally with normal work of breathing Abdomen: soft, non-tender, non-distended, normoactive  bowel sounds Skin: warm, dry, no rashes or lesions, cap refill < 2 seconds Extremities: warm and well perfused, normal tone, no  edema  Laboratory: Recent Labs  Lab 08/01/17 0305 08/02/17 0249 08/03/17 0221  WBC 13.5* 13.9* 14.2*  HGB 8.2* 9.2* 10.0*  HCT 23.9* 27.2* 29.6*  PLT 82* 96* 112*   Recent Labs  Lab 07/31/17 0629  08/01/17 0305 08/02/17 0249 08/03/17 0221  NA 132*   < > 136 136 138  K 6.6*   < > 4.6 3.5 3.4*  CL 93*   < > 99* 97* 98*  CO2 17*   < > 21* 25 27  BUN 159*   < > 117* 75* 46*  CREATININE 10.08*   < > 8.26* 5.33* 3.83*  CALCIUM 6.5*   < > 6.7* 7.1* 7.3*  PROT 6.1*  --  5.4* 5.7*  --   BILITOT 0.8  --  0.7 0.6  --   ALKPHOS 104  --  120 124  --   ALT 2,366*  --  1,783* 1,237*  --   AST 729*  --  362* 146*  --   GLUCOSE 271*   < > 144* 179* 170*   < > = values in this interval not displayed.   5/10 GGT: 256 5/10 Lipase: 80 < 89 5/10 FOBT: Positive 5/09 Troponin I: 11.3 <11.0 <11.75 <11.58 5/09 HBV panel: Negative 5/09 Ferritin: >7,500 5/08 UA: Moderate hemoglobin, 150 glucose, >300 protein 5/08 Blood cultures x2: NGTD 5/08 Ethanol: Negative 5/08 Acetaminophen level: Negative 5/08 Ammonia: 35 5/08 Reticulocytes: 2.44 5/08 Iron studies: Iron 230, TIBC 238, saturation 97% 5/08 Folate: 11.7 5/08 B12: 4551 5/08 Hepatitis panel: Negative 5/08 INR: 1.71 5/08 BNP: 4146 (1057, 1 year ago)  Imaging/Diagnostic Tests: CT ABDOMEN PELVIS W CONTRAST (08/01/2017) 1. Small right pleural effusion and minimal left pleural effusion. 2. Mild bilateral lower lobe dependent atelectasis. 3. Cardiomegaly. 4. Mild diffuse hepatic steatosis. 5. Small amount of air in the urinary bladder, most likely due to recent catheterization. 6.  Calcific coronary artery and aortic atherosclerosis. 7. Mild prostatic hypertrophy.  ABDOMEN ULTRASOUND COMPLETE (07/30/2017) Unremarkable ultrasound of the abdomen. No acute intra-abdominal findings. Uncomplicated appearing renal cystic disease without hydronephrosis or solid mass. Hepatic steatosis.  No gallstones or biliary ductal dilatation.  CHEST - 2  (07/30/2017) Cardiac enlargement and low bilateral lung volumes.    Kamiah Bing, DO 08/03/2017, 7:11 AM PGY-2, [place] Intern pager: [phone], text pages welcome

## 2017-08-03 NOTE — Progress Notes (Signed)
Progress Note  Patient Name: Joseph Mcdonald Date of Encounter: 08/03/2017  Primary Cardiologist: Dorris Carnes, MD   Subjective    Joseph Mcdonald is a 67 y.o. 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 Nori Riis.  Echo cardiogram shows moderately reduced left ventricular systolic function with an EF of 35 to 40%.  He has mild pulmonary hypertension with an estimated PA pressure of 37.  He is been getting dialysis.  He is gradually feeling better.  Inpatient Medications    Scheduled Meds: . bisacodyl  10 mg Rectal q morning - 10a  . calcium acetate  2,001 mg Oral TID WC  . Chlorhexidine Gluconate Cloth  6 each Topical Q0600  . darbepoetin (ARANESP) injection - DIALYSIS  100 mcg Intravenous Q Sat-HD  . insulin aspart  0-9 Units Subcutaneous TID WC  . mouth rinse  15 mL Mouth Rinse BID  . mupirocin ointment  1 application Nasal BID  . tamsulosin  0.4 mg Oral QPC breakfast   Continuous Infusions: . sodium chloride    . cefTRIAXone (ROCEPHIN)  IV Stopped (08/02/17 2221)   PRN Meds: hydrALAZINE, ondansetron (ZOFRAN) IV, oxyCODONE-acetaminophen, polyethylene glycol   Vital Signs    Vitals:   08/03/17 0452 08/03/17 0500 08/03/17 0600 08/03/17 0754  BP: (!) 175/80  (!) 156/76 (!) 168/77  Pulse: 60  65 70  Resp: 14  17 19   Temp: 98.3 F (36.8 C)   99.4 F (37.4 C)  TempSrc: Oral   Axillary  SpO2: 97%  97% 97%  Weight:  157 lb 3 oz (71.3 kg)    Height:        Intake/Output Summary (Last 24 hours) at 08/03/2017 1035 Last data filed at 08/03/2017 0700 Gross per 24 hour  Intake 1280 ml  Output 3850 ml  Net -2570 ml   Filed Weights   08/02/17 1810 08/02/17 2050 08/03/17 0500  Weight: 162 lb 4.1 oz (73.6 kg) 157 lb 13.6 oz (71.6 kg) 157 lb 3 oz (71.3 kg)     Telemetry    NSR  - Personally Reviewed  ECG     NSR  - Personally Reviewed  Physical Exam   Physical Exam: Blood pressure (!) 168/77, pulse 70, temperature 99.4 F (37.4 C), temperature source Axillary, resp. rate 19, height 5\' 4"  (1.626 m), weight 157 lb 3 oz (71.3 kg), SpO2 97 %.  GEN:  Well nourished, well developed in no acute distress HEENT: Normal NECK: No JVD; No carotid bruits LYMPHATICS: No lymphadenopathy CARDIAC: RR, no murmurs, rubs, gallops RESPIRATORY:  Clear to auscultation without rales, wheezing or rhonchi  ABDOMEN: Soft, non-tender, non-distended MUSCULOSKELETAL:  No edema; No deformity  SKIN: Warm and dry NEUROLOGIC:  Alert and oriented x 3   Labs    Chemistry Recent Labs  Lab 07/31/17 0629  08/01/17 0305 08/02/17 0249 08/03/17 0221  NA 132*   < > 136 136 138  K 6.6*   < > 4.6 3.5 3.4*  CL 93*   < > 99* 97* 98*  CO2 17*   < > 21* 25 27  GLUCOSE 271*   < > 144* 179* 170*  BUN 159*   < > 117* 75* 46*  CREATININE 10.08*   < > 8.26* 5.33* 3.83*  CALCIUM 6.5*   < >  6.7* 7.1* 7.3*  PROT 6.1*  --  5.4* 5.7*  --   ALBUMIN 3.2*  --  2.9* 2.8* 2.7*  AST 729*  --  362* 146*  --   ALT 2,366*  --  1,783* 1,237*  --   ALKPHOS 104  --  120 124  --   BILITOT 0.8  --  0.7 0.6  --   GFRNONAA 5*   < > 6* 10* 15*  GFRAA 5*   < > 7* 12* 17*  ANIONGAP 22*   < > 16* 14 13   < > = values in this interval not displayed.     Hematology Recent Labs  Lab 08/01/17 0305 08/02/17 0249 08/03/17 0221  WBC 13.5* 13.9* 14.2*  RBC 2.68* 3.05* 3.31*  HGB 8.2* 9.2* 10.0*  HCT 23.9* 27.2* 29.6*  MCV 89.2 89.2 89.4  MCH 30.6 30.2 30.2  MCHC 34.3 33.8 33.8  RDW 14.1 13.8 13.7  PLT 82* 96* 112*    Cardiac Enzymes Recent Labs  Lab 07/30/17 2334 07/31/17 0935 07/31/17 1607 07/31/17 2208  TROPONINI 11.58* 11.75* 11.03* 11.30*   No results for input(s): TROPIPOC in the last 168 hours.   BNP Recent Labs  Lab 07/30/17 1358  BNP 4,146.3*     DDimer No  results for input(s): DDIMER in the last 168 hours.   Radiology    Ct Abdomen Pelvis W Contrast  Result Date: 08/01/2017 CLINICAL DATA:  Abdominal distention. EXAM: CT ABDOMEN AND PELVIS WITH CONTRAST TECHNIQUE: Multidetector CT imaging of the abdomen and pelvis was performed using the standard protocol following bolus administration of intravenous contrast. CONTRAST:  176mL OMNIPAQUE IOHEXOL 300 MG/ML  SOLN COMPARISON:  Complete abdomen ultrasound dated 07/30/2017. Abdomen and pelvis CT dated 12/26/2015. FINDINGS: Lower chest: Enlarged heart. Small right pleural effusion and minimal left pleural effusion. Mild bilateral lower lobe dependent atelectasis. Hepatobiliary: Small calcified granulomata in the liver. Mild diffuse low density of the liver. Normal appearing gallbladder. Pancreas: Unremarkable. No pancreatic ductal dilatation or surrounding inflammatory changes. Spleen: Normal in size without focal abnormality. Adrenals/Urinary Tract: Normal appearing adrenal glands. Multiple bilateral renal cysts. Small amount of air in the urinary bladder. Otherwise, normal appearing urinary bladder and ureters. No urinary tract calculi or hydronephrosis. Stomach/Bowel: Stomach is within normal limits. Appendix appears normal. No evidence of bowel wall thickening, distention, or inflammatory changes. Vascular/Lymphatic: Atheromatous arterial calcifications without aneurysm. These include the coronary arteries. No enlarged lymph nodes. Reproductive: Mildly enlarged prostate gland. Other: No abdominal wall hernia or abnormality. No abdominopelvic ascites. Musculoskeletal: Lumbar and lower thoracic spine degenerative changes. IMPRESSION: 1. Small right pleural effusion and minimal left pleural effusion. 2. Mild bilateral lower lobe dependent atelectasis. 3. Cardiomegaly. 4. Mild diffuse hepatic steatosis. 5. Small amount of air in the urinary bladder, most likely due to recent catheterization. 6.  Calcific coronary  artery and aortic atherosclerosis. 7. Mild prostatic hypertrophy. Electronically Signed   By: Claudie Revering M.D.   On: 08/01/2017 21:31    Cardiac Studies     Patient Profile     67 y.o. male with acute renal insufficiency , hx of CAD and + troponins  Assessment & Plan    1.  Troponin elevation: Likely due to demand ischemia.  He has not had any episodes of chest pain.  2.  Coronary artery disease: He has a history of coronary artery bypass grafting.  He is had normal left ventricular systolic function in the past.  Echocardiogram from yesterday reveals diffuse left ventricular  hypokinesis with an ejection fraction of 35 to 40%.  He denies having any episodes of chest pain.  We will consider ischemic work-up in the near future.  3.  Chronic combined systolic and diastolic heart failure: Ejection fraction is 35 to 40%.  We were not able to evaluate diastolic dysfunction.Marland Kitchen He is thought to have hemochromatosis.   States that symptoms have improved.  4.  Hypertension:  Will add hydralazine 10 TID , increase up as tolerated.   For questions or updates, please contact [place] consult [email or web address] for contact info under Cardiology/STEMI.      Signed, Mertie Moores, MD  08/03/2017, 10:35 AM

## 2017-08-04 LAB — COMPREHENSIVE METABOLIC PANEL
ALK PHOS: 94 U/L (ref 38–126)
ALT: 496 U/L — ABNORMAL HIGH (ref 17–63)
ANION GAP: 13 (ref 5–15)
AST: 43 U/L — ABNORMAL HIGH (ref 15–41)
Albumin: 2.6 g/dL — ABNORMAL LOW (ref 3.5–5.0)
BILIRUBIN TOTAL: 0.4 mg/dL (ref 0.3–1.2)
BUN: 58 mg/dL — ABNORMAL HIGH (ref 6–20)
CALCIUM: 6.7 mg/dL — AB (ref 8.9–10.3)
CO2: 26 mmol/L (ref 22–32)
Chloride: 96 mmol/L — ABNORMAL LOW (ref 101–111)
Creatinine, Ser: 4.85 mg/dL — ABNORMAL HIGH (ref 0.61–1.24)
GFR calc Af Amer: 13 mL/min — ABNORMAL LOW (ref >60)
GFR calc non Af Amer: 11 mL/min — ABNORMAL LOW (ref >60)
GLUCOSE: 184 mg/dL — AB (ref 65–99)
Potassium: 3.3 mmol/L — ABNORMAL LOW (ref 3.5–5.1)
Sodium: 135 mmol/L (ref 135–145)
TOTAL PROTEIN: 5.1 g/dL — AB (ref 6.5–8.1)

## 2017-08-04 LAB — CULTURE, BLOOD (ROUTINE X 2)
CULTURE: NO GROWTH
Culture: NO GROWTH
Special Requests: ADEQUATE
Special Requests: ADEQUATE

## 2017-08-04 LAB — RENAL FUNCTION PANEL
Albumin: 2.6 g/dL — ABNORMAL LOW (ref 3.5–5.0)
Anion gap: 13 (ref 5–15)
BUN: 56 mg/dL — ABNORMAL HIGH (ref 6–20)
CALCIUM: 6.7 mg/dL — AB (ref 8.9–10.3)
CO2: 26 mmol/L (ref 22–32)
CREATININE: 4.74 mg/dL — AB (ref 0.61–1.24)
Chloride: 96 mmol/L — ABNORMAL LOW (ref 101–111)
GFR calc non Af Amer: 11 mL/min — ABNORMAL LOW (ref >60)
GFR, EST AFRICAN AMERICAN: 13 mL/min — AB (ref >60)
Glucose, Bld: 185 mg/dL — ABNORMAL HIGH (ref 65–99)
Phosphorus: 4.9 mg/dL — ABNORMAL HIGH (ref 2.5–4.6)
Potassium: 3.2 mmol/L — ABNORMAL LOW (ref 3.5–5.1)
SODIUM: 135 mmol/L (ref 135–145)

## 2017-08-04 LAB — CBC
HCT: 26.5 % — ABNORMAL LOW (ref 39.0–52.0)
HEMOGLOBIN: 9 g/dL — AB (ref 13.0–17.0)
MCH: 30.3 pg (ref 26.0–34.0)
MCHC: 34 g/dL (ref 30.0–36.0)
MCV: 89.2 fL (ref 78.0–100.0)
Platelets: 108 10*3/uL — ABNORMAL LOW (ref 150–400)
RBC: 2.97 MIL/uL — AB (ref 4.22–5.81)
RDW: 13.9 % (ref 11.5–15.5)
WBC: 13.7 10*3/uL — ABNORMAL HIGH (ref 4.0–10.5)

## 2017-08-04 LAB — PROTIME-INR
INR: 1.09
PROTHROMBIN TIME: 14 s (ref 11.4–15.2)

## 2017-08-04 LAB — ANTINUCLEAR ANTIBODIES, IFA: ANA Ab, IFA: NEGATIVE

## 2017-08-04 LAB — GLUCOSE, CAPILLARY
GLUCOSE-CAPILLARY: 281 mg/dL — AB (ref 65–99)
Glucose-Capillary: 138 mg/dL — ABNORMAL HIGH (ref 65–99)
Glucose-Capillary: 401 mg/dL — ABNORMAL HIGH (ref 65–99)
Glucose-Capillary: 408 mg/dL — ABNORMAL HIGH (ref 65–99)

## 2017-08-04 MED ORDER — SODIUM CHLORIDE 0.9 % IV SOLN: 100.0000 mL | INTRAVENOUS | Status: DC | PRN

## 2017-08-04 MED ORDER — PENTAFLUOROPROP-TETRAFLUOROETH EX AERO: 1.0000 "application " | INHALATION_SPRAY | CUTANEOUS | Status: DC | PRN

## 2017-08-04 MED ORDER — LIDOCAINE-PRILOCAINE 2.5-2.5 % EX CREA: 1.0000 "application " | TOPICAL_CREAM | CUTANEOUS | Status: DC | PRN

## 2017-08-04 MED ORDER — ALTEPLASE 2 MG IJ SOLR: 2.0000 mg | Freq: Once | INTRAMUSCULAR | Status: DC | PRN

## 2017-08-04 MED ORDER — HEPARIN SODIUM (PORCINE) 1000 UNIT/ML DIALYSIS: 1000.0000 [IU] | INTRAMUSCULAR | Status: DC | PRN

## 2017-08-04 MED ORDER — INSULIN ASPART 100 UNIT/ML ~~LOC~~ SOLN
10.0000 [IU] | Freq: Once | SUBCUTANEOUS | Status: AC
  Administered 2017-08-04: 10 [IU] via SUBCUTANEOUS

## 2017-08-04 MED ORDER — LIDOCAINE HCL (PF) 1 % IJ SOLN: 5.0000 mL | INTRAMUSCULAR | Status: DC | PRN

## 2017-08-04 NOTE — Progress Notes (Signed)
Hemachromatosis gene study still pending, but other tests for liver disease negative, testing for:  Hep A, B, and C; autoimmune hepatitis; Wilson's disease; PBC.  Discussed w/ pt.   Plan:  1. Await cardiac cath results 2. Once cardiac/vascular access issues are addressed, we can consider either an MRI without contrast (can check for iron overload in various organs such as liver or pancreas) or a trans-jugular liver bx.  Will follow at a distance with you; if more immediate input is needed, please call us.  Cleotis Nipper, M.D. Pager [phone] If no answer or after 5 PM call [phone]

## 2017-08-04 NOTE — Progress Notes (Signed)
CRITICAL VALUE ALERT  Critical Value:  CBG 408  Date & Time Notied:  08/04/17 at [place]  Provider Notified: Dr Enid Derry  Orders Received/Actions taken: Give 10 units novolog

## 2017-08-04 NOTE — Care Management Note (Addendum)
Case Management Note  Patient Details  Name: Kirubel Aja MRN: [id number] Date of Birth: Jul 31, 1950  Subjective/Objective:     From home with spouse, transaminitis, developed acute/chronic renal failure, progressed to ESRD due for 4th HD today, plan to CLIP, Cards work up before get fisutla acess, poss heart cath later this week. Patient would like HHRN with [facility], referral made to [facility] with [facility], will need HHRN orders with face to face. Soc will begin 24-48 hrs post dc.               Action/Plan: NCM will follow for dc needs.  Expected Discharge Date:                  Expected Discharge Plan:  Home/Self Care  In-House Referral:     Discharge planning Services  CM Consult  Post Acute Care Choice:    Choice offered to:     DME Arranged:    DME Agency:     HH Arranged:   [place] Agency:   [facility]  Status of Service:  In process, will continue to follow  If discussed at Long Length of Stay Meetings, dates discussed:    Additional Comments:  Zenon Mayo, RN 08/04/2017, 10:15 AM

## 2017-08-04 NOTE — Progress Notes (Signed)
Family Medicine Teaching Service Daily Progress Note Intern Pager: [phone]  Patient name: Joseph Mcdonald Medical record number: [id number] Date of birth: 02/12/1951 Age: 67 y.o. Gender: male  Primary Care Provider: Mercy Riding, MD Consultants: Nephrology, Cardiology Code Status: Full  Pt Overview and Major Events to Date:  5/08 Admitted 5/09 HD #1 5/10 VVS vein mapping, CT abd/pelvis neg 5/11 HD #2, on room air  Assessment and Plan: Joseph Mcdonald is a 67 y.o. male presenting with dyspnea and abdominal pain. PMH is significant for CAD status post CABG 2011, CKD 4, DM-2, hypertension, GERD, chronic back pain, secondary hyperparathyroidism, anemia of renal disease, gout, bradycardia.   1.  Shortness of breath  New onset HFrEF: Resolved.  Remains on room air.  Thought to be related to fluid overload in the setting of HF exacerbation given worsening EF 30% down from prior, 55-60%.  Remains off diuretics and improving with HD for fluid management.  Patient has now progressed to ARF and will likely not improve.  Currently awaiting AVF establishment and heart cath. -Nephrology consulted, appreciate recommendations, s/p HD 5/9, 5/11 and receiving HD today -Cardiology consulted, appreciate recommendations- to have cath on 5/14 -Daily weights, I&O's -Telemetry  2.  Abdominal pain  Elevated transaminases: Improving, abdominal pain resolved.  Patient continues to have bowel movements.  Hepatic panel negative.  GI consulted considering hemochromatosis work-up given negative CT abdomen and pelvis and right upper quadrant ultrasound.  Transaminases improving. - GI consulted, appreciate recommendations- Hemochromatosis DNA pending and autoimmune labs so far wnl except IgG is decreased at 585 - Continue renal diet  3.  Anemia of chronic inflammation  Thrombocytopenia  Iron overload: Improving.  Hemoglobin 9.0 this am and platelets stable at 108.  Has femoral cath for HD access.   Does have signs of iron overload.  Possibly hemochromatosis- GI following. - Continue monitoring CBC  4.  Coronary arterial disease: History of CABG in 2011.  No signs of ACS on exam.  Troponins plateaued to 11.  Cardiology following - Heart catheterization per cardiology 5/14  5.  Acute on chronic CKD 4  Hyperkalemia  Anion gap metabolic acidosis: Hyperkalemia and metabolic acidosis resolved following HD.  Has progressed to ESRD.  Nephrology currently working on obtaining permanent access. - Plan per problem #1  6.  Diabetes mellitus type 2: Chronic.  A1c 7.2.  Takes Lantus and Victoza at home.  CBGs in the 100s. -Sensitive sliding scale with at bedtime coverage  7.  Hypertension: Chronic.  Improved following HD. - Continue hydralazine as needed and continue 10 mg TID  8.  Urinary retention: Likely related to BPH indicated on CT abdomen and pelvis. - Initiating Flomax - Foley cath in and out as needed following bladder scans  FEN/GI: Renal diet PPx: SCDs   Disposition:  Plan to continue HD and pursue fistula access.  Heart cath 5/14.  Subjective:  Patient has no complaints this morning. He had a bm last night. He says he feels fine today. In HD  Objective: Temp:  [98.3 F (36.8 C)-98.9 F (37.2 C)] 98.3 F (36.8 C) (05/13 0755) Pulse Rate:  [51-96] 60 (05/13 1100) Resp:  [13-21] 15 (05/13 1100) BP: (118-173)/(67-85) 140/77 (05/13 1100) SpO2:  [96 %-99 %] 99 % (05/13 0755) Weight:  [158 lb 15.2 oz (72.1 kg)-162 lb 11.2 oz (73.8 kg)] 162 lb 11.2 oz (73.8 kg) (05/13 0755) Physical Exam: General: NAD, pleasant, in HD Cardiovascular: RRR, no m/r/g, no LE edema Respiratory: CTA BL, normal  work of breathing Gastrointestinal: soft, nontender, nondistended, normoactive BS MSK: moves 4 extremities equally Derm: no rashes appreciated Neuro: CN II-XII grossly intact Psych: AO, appropriate affect  Laboratory: Recent Labs  Lab 08/02/17 0249 08/03/17 0221 08/04/17 0251   WBC 13.9* 14.2* 13.7*  HGB 9.2* 10.0* 9.0*  HCT 27.2* 29.6* 26.5*  PLT 96* 112* 108*   Recent Labs  Lab 08/01/17 0305 08/02/17 0249 08/03/17 0221 08/04/17 0251  NA 136 136 138 135  135  K 4.6 3.5 3.4* 3.3*  3.2*  CL 99* 97* 98* 96*  96*  CO2 21* 25 27 26  26   BUN 117* 75* 46* 58*  56*  CREATININE 8.26* 5.33* 3.83* 4.85*  4.74*  CALCIUM 6.7* 7.1* 7.3* 6.7*  6.7*  PROT 5.4* 5.7*  --  5.1*  BILITOT 0.7 0.6  --  0.4  ALKPHOS 120 124  --  94  ALT 1,783* 1,237*  --  496*  AST 362* 146*  --  43*  GLUCOSE 144* 179* 170* 184*  185*   5/11 ANA neg 5/11 Ceruloplasmin 29.7 (wnl) 5/11 PTH: 121 5/11 IgG 585, IgA 264, IgM 48 5/10 GGT: 256 5/10 Lipase: 80 < 89 5/10 FOBT: Positive 5/09 Troponin I: 11.3 <11.0 <11.75 <11.58 5/09 HBV panel: Negative 5/09 Ferritin: >7,500 5/08 UA: Moderate hemoglobin, 150 glucose, >300 protein 5/08 Blood cultures x2: NGTD 5/08 Ethanol: Negative 5/08 Acetaminophen level: Negative 5/08 Ammonia: 35 5/08 Reticulocytes: 2.44 5/08 Iron studies: Iron 230, TIBC 238, saturation 97% 5/08 Folate: 11.7 5/08 B12: 4551 5/08 Hepatitis panel: Negative 5/08 INR: 1.71 5/08 BNP: 4146 (1057, 1 year ago)  Imaging/Diagnostic Tests: CT ABDOMEN PELVIS W CONTRAST (08/01/2017) 1. Small right pleural effusion and minimal left pleural effusion. 2. Mild bilateral lower lobe dependent atelectasis. 3. Cardiomegaly. 4. Mild diffuse hepatic steatosis. 5. Small amount of air in the urinary bladder, most likely due to recent catheterization. 6.  Calcific coronary artery and aortic atherosclerosis. 7. Mild prostatic hypertrophy.  ABDOMEN ULTRASOUND COMPLETE (07/30/2017) Unremarkable ultrasound of the abdomen. No acute intra-abdominal findings. Uncomplicated appearing renal cystic disease without hydronephrosis or solid mass. Hepatic steatosis.  No gallstones or biliary ductal dilatation.  CHEST - 2 (07/30/2017) Cardiac enlargement and low bilateral lung  volumes.   Mykaela Arena, [place], DO 08/04/2017, 12:04 PM PGY-1, [place] Intern pager: [phone], text pages welcome

## 2017-08-04 NOTE — H&P (View-Only) (Signed)
Progress Note  Patient Name: Joseph Mcdonald Date of Encounter: 08/04/2017  Primary Cardiologist: Dorris Carnes, MD   Subjective    Joseph Mcdonald is a 67 y.o. 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 Nori Riis.  Echo cardiogram shows moderately reduced left ventricular systolic function with an EF of 35 to 40%.  He has mild pulmonary hypertension with an estimated PA pressure of 37.  Seen in dialysis today   Inpatient Medications    Scheduled Meds: . bisacodyl  10 mg Rectal q morning - 10a  . calcium acetate  2,001 mg Oral TID WC  . darbepoetin (ARANESP) injection - DIALYSIS  100 mcg Intravenous Q Sat-HD  . hydrALAZINE  10 mg Oral Q8H  . insulin aspart  0-9 Units Subcutaneous TID WC  . mouth rinse  15 mL Mouth Rinse BID  . tamsulosin  0.4 mg Oral QPC breakfast   Continuous Infusions: . sodium chloride    . sodium chloride    . sodium chloride    . cefTRIAXone (ROCEPHIN)  IV Stopped (08/03/17 2036)   PRN Meds: sodium chloride, sodium chloride, alteplase, heparin, hydrALAZINE, lidocaine (PF), lidocaine-prilocaine, ondansetron (ZOFRAN) IV, oxyCODONE-acetaminophen, pentafluoroprop-tetrafluoroeth, polyethylene glycol   Vital Signs    Vitals:   08/04/17 0800 08/04/17 0830 08/04/17 0900 08/04/17 0930  BP: (!) 142/68 137/69 132/68 (!) 149/73  Pulse: 69 62 63 65  Resp: 18 17 16 19   Temp:      TempSrc:      SpO2:      Weight:      Height:        Intake/Output Summary (Last 24 hours) at 08/04/2017 0952 Last data filed at 08/04/2017 0900 Gross per 24 hour  Intake 700 ml  Output 1200 ml  Net -500 ml   Filed Weights   08/03/17 0500 08/04/17 0500 08/04/17 0755  Weight: 157 lb 3 oz (71.3 kg) 158 lb 15.2 oz (72.1 kg) 162 lb 11.2 oz (73.8 kg)    Telemetry    NSR  - Personally Reviewed  ECG     NSR  - Personally Reviewed  Physical Exam   Physical Exam: Blood pressure (!) 149/73, pulse 65, temperature 98.3 F (36.8 C), temperature source Oral, resp. rate 19, height 5\' 4"  (1.626 m), weight 162 lb 11.2 oz (73.8 kg), SpO2 99 %.  Affect appropriate Chronically ill white male  HEENT: normal Neck supple with no adenopathy JVP normal no bruits no thyromegaly Lungs clear with no wheezing and good diaphragmatic motion Heart:  S1/S2 SEM  murmur, no rub, gallop or click PMI normal Abdomen: benighn, BS positve, no tenderness, no AAA no bruit.  No HSM or HJR Distal pulses intact with no bruits No edema Neuro non-focal Skin warm and dry No muscular weakness Dialysis catheter in [name]  Lab 08/01/17 0305 08/02/17 0249 08/03/17 0221 08/04/17 0251  NA 136 136 138 135  135  K 4.6 3.5 3.4* 3.3*  3.2*  CL 99* 97* 98* 96*  96*  CO2 21* 25 27 26  26   GLUCOSE 144* 179* 170* 184*  185*  BUN 117* 75* 46* 58*  56*  CREATININE 8.26* 5.33* 3.83* 4.85*  4.74*  CALCIUM 6.7* 7.1* 7.3* 6.7*  6.7*  PROT 5.4* 5.7*  --  5.1*  ALBUMIN 2.9* 2.8* 2.7* 2.6*  2.6*  AST 362* 146*  --  43*  ALT 1,783* 1,237*  --  496*  ALKPHOS 120 124  --  94  BILITOT 0.7 0.6  --  0.4  GFRNONAA 6* 10* 15* 11*  11*  GFRAA 7* 12* 17* 13*  13*  ANIONGAP 16* 14 13 13  13      Hematology Recent Labs  Lab 08/02/17 0249 08/03/17 0221 08/04/17 0251  WBC 13.9* 14.2* 13.7*  RBC 3.05* 3.31* 2.97*  HGB 9.2* 10.0* 9.0*  HCT 27.2* 29.6* 26.5*  MCV 89.2 89.4 89.2  MCH 30.2 30.2 30.3  MCHC 33.8 33.8 34.0  RDW 13.8 13.7 13.9  PLT 96* 112* 108*    Cardiac Enzymes Recent Labs  Lab 07/30/17 2334 07/31/17 0935 07/31/17 1607 07/31/17 2208  TROPONINI 11.58* 11.75* 11.03* 11.30*   No results for input(s): TROPIPOC in the last 168 hours.   BNP Recent Labs  Lab 07/30/17 1358  BNP 4,146.3*     DDimer No results for  input(s): DDIMER in the last 168 hours.   Radiology    No results found.  Cardiac Studies     Patient Profile     67 y.o. male with acute renal insufficiency , hx of CAD and + troponins  Assessment & Plan    1.  Troponin elevation: His troponins remained around 11.  Reviewed notes from [facility] and renal and no anesthesia/access Until heart cleared. He is committed to dialysis at this point Discussed need for cath with patient including risks and willing To proceed. Have arranged for tomorrow with Dr [place]. Indicated given elevated troponin and newly diagnosed decrease in EF to 35-40% with old by pass grafts CABG 2011 (LIMA->LAD; VG->ramus,OM1; VG->PDA, PLSA        Signed, Jenkins Rouge, MD  08/04/2017, 9:52 AM

## 2017-08-04 NOTE — Progress Notes (Signed)
   Daily Progress Note   Assessment/Planning:   ESRD, CAD, HF   Awaiting completion of Cardiology work-up prior to proceeding with [facility] and L arm access placement  Suspect will need Cardiology clearance to clear Anes  Regardless will plan on [facility] placement on Friday.  If pt has been cleared by Cardiology will proceed with L BVT (staged vs single) though L BC AVF might be possible   Subjective     Feeling better   Objective   Vitals:   08/03/17 1943 08/04/17 0000 08/04/17 0404 08/04/17 0500  BP: (!) 156/74 (!) 172/74 (!) 173/69   Pulse: 96 96 (!) 51   Resp: 13 16 16    Temp: 98.6 F (37 C) 98.3 F (36.8 C) 98.3 F (36.8 C)   TempSrc: Oral Oral Oral   SpO2:  97% 96%   Weight:    158 lb 15.2 oz (72.1 kg)  Height:         Intake/Output Summary (Last 24 hours) at 08/04/2017 0714 Last data filed at 08/04/2017 7408 Gross per 24 hour  Intake 580 ml  Output 1200 ml  Net -620 ml    PULM  CTAB, greatly improved BS  CV  RRR  GI  soft, NTND  VASC B brachial palpable, R radial not palpable, L wrist wrapped up  NEURO Alert and oriented today    Laboratory   CBC CBC Latest Ref Rng & Units 08/04/2017 08/03/2017 08/02/2017  WBC 4.0 - 10.5 K/uL 13.7(H) 14.2(H) 13.9(H)  Hemoglobin 13.0 - 17.0 g/dL 9.0(L) 10.0(L) 9.2(L)  Hematocrit 39.0 - 52.0 % 26.5(L) 29.6(L) 27.2(L)  Platelets 150 - 400 K/uL 108(L) 112(L) 96(L)    BMET    Component Value Date/Time   NA 135 08/04/2017 0251   NA 135 08/04/2017 0251   NA 139 03/12/2017 1227   K 3.3 (L) 08/04/2017 0251   K 3.2 (L) 08/04/2017 0251   CL 96 (L) 08/04/2017 0251   CL 96 (L) 08/04/2017 0251   CO2 26 08/04/2017 0251   CO2 26 08/04/2017 0251   GLUCOSE 184 (H) 08/04/2017 0251   GLUCOSE 185 (H) 08/04/2017 0251   BUN 58 (H) 08/04/2017 0251   BUN 56 (H) 08/04/2017 0251   BUN 62 (H) 03/12/2017 1227   CREATININE 4.85 (H) 08/04/2017 0251   CREATININE 4.74 (H) 08/04/2017 0251   CREATININE 2.81 (H) 02/19/2016 1459   CALCIUM 6.7  (L) 08/04/2017 0251   CALCIUM 6.7 (L) 08/04/2017 0251   GFRNONAA 11 (L) 08/04/2017 0251   GFRNONAA 11 (L) 08/04/2017 0251   GFRNONAA 22 (L) 02/19/2016 1459   GFRAA 13 (L) 08/04/2017 0251   GFRAA 13 (L) 08/04/2017 0251   GFRAA 26 (L) 02/19/2016 1459     Adele Barthel, MD, FACS [facility] Office: [phone] Pager: [phone]  08/04/2017, 7:14 AM

## 2017-08-04 NOTE — Care Management Important Message (Signed)
Important Message  Patient Details  Name: Joseph Mcdonald MRN: [id number] Date of Birth: [birth year]   Medicare Important Message Given:  Yes    Orbie Pyo 08/04/2017, 3:38 PM

## 2017-08-04 NOTE — Progress Notes (Signed)
Progress Note  Patient Name: Joseph Mcdonald Date of Encounter: 08/04/2017  Primary Cardiologist: Dorris Carnes, MD   Subjective    Joseph Mcdonald is a 67 y.o. 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 Nori Riis.  Echo cardiogram shows moderately reduced left ventricular systolic function with an EF of 35 to 40%.  He has mild pulmonary hypertension with an estimated PA pressure of 37.  Seen in dialysis today   Inpatient Medications    Scheduled Meds: . bisacodyl  10 mg Rectal q morning - 10a  . calcium acetate  2,001 mg Oral TID WC  . darbepoetin (ARANESP) injection - DIALYSIS  100 mcg Intravenous Q Sat-HD  . hydrALAZINE  10 mg Oral Q8H  . insulin aspart  0-9 Units Subcutaneous TID WC  . mouth rinse  15 mL Mouth Rinse BID  . tamsulosin  0.4 mg Oral QPC breakfast   Continuous Infusions: . sodium chloride    . sodium chloride    . sodium chloride    . cefTRIAXone (ROCEPHIN)  IV Stopped (08/03/17 2036)   PRN Meds: sodium chloride, sodium chloride, alteplase, heparin, hydrALAZINE, lidocaine (PF), lidocaine-prilocaine, ondansetron (ZOFRAN) IV, oxyCODONE-acetaminophen, pentafluoroprop-tetrafluoroeth, polyethylene glycol   Vital Signs    Vitals:   08/04/17 0800 08/04/17 0830 08/04/17 0900 08/04/17 0930  BP: (!) 142/68 137/69 132/68 (!) 149/73  Pulse: 69 62 63 65  Resp: 18 17 16 19   Temp:      TempSrc:      SpO2:      Weight:      Height:        Intake/Output Summary (Last 24 hours) at 08/04/2017 0952 Last data filed at 08/04/2017 0900 Gross per 24 hour  Intake 700 ml  Output 1200 ml  Net -500 ml   Filed Weights   08/03/17 0500 08/04/17 0500 08/04/17 0755  Weight: 157 lb 3 oz (71.3 kg) 158 lb 15.2 oz (72.1 kg) 162 lb 11.2 oz (73.8 kg)    Telemetry    NSR  - Personally Reviewed  ECG     NSR  - Personally Reviewed  Physical Exam   Physical Exam: Blood pressure (!) 149/73, pulse 65, temperature 98.3 F (36.8 C), temperature source Oral, resp. rate 19, height 5\' 4"  (1.626 m), weight 162 lb 11.2 oz (73.8 kg), SpO2 99 %.  Affect appropriate Chronically ill white male  HEENT: normal Neck supple with no adenopathy JVP normal no bruits no thyromegaly Lungs clear with no wheezing and good diaphragmatic motion Heart:  S1/S2 SEM  murmur, no rub, gallop or click PMI normal Abdomen: benighn, BS positve, no tenderness, no AAA no bruit.  No HSM or HJR Distal pulses intact with no bruits No edema Neuro non-focal Skin warm and dry No muscular weakness Dialysis catheter in [place]  Lab 08/01/17 0305 08/02/17 0249 08/03/17 0221 08/04/17 0251  NA 136 136 138 135  135  K 4.6 3.5 3.4* 3.3*  3.2*  CL 99* 97* 98* 96*  96*  CO2 21* 25 27 26  26   GLUCOSE 144* 179* 170* 184*  185*  BUN 117* 75* 46* 58*  56*  CREATININE 8.26* 5.33* 3.83* 4.85*  4.74*  CALCIUM 6.7* 7.1* 7.3* 6.7*  6.7*  PROT 5.4* 5.7*  --  5.1*  ALBUMIN 2.9* 2.8* 2.7* 2.6*  2.6*  AST 362* 146*  --  43*  ALT 1,783* 1,237*  --  496*  ALKPHOS 120 124  --  94  BILITOT 0.7 0.6  --  0.4  GFRNONAA 6* 10* 15* 11*  11*  GFRAA 7* 12* 17* 13*  13*  ANIONGAP 16* 14 13 13  13      Hematology Recent Labs  Lab 08/02/17 0249 08/03/17 0221 08/04/17 0251  WBC 13.9* 14.2* 13.7*  RBC 3.05* 3.31* 2.97*  HGB 9.2* 10.0* 9.0*  HCT 27.2* 29.6* 26.5*  MCV 89.2 89.4 89.2  MCH 30.2 30.2 30.3  MCHC 33.8 33.8 34.0  RDW 13.8 13.7 13.9  PLT 96* 112* 108*    Cardiac Enzymes Recent Labs  Lab 07/30/17 2334 07/31/17 0935 07/31/17 1607 07/31/17 2208  TROPONINI 11.58* 11.75* 11.03* 11.30*   No results for input(s): TROPIPOC in the last 168 hours.   BNP Recent Labs  Lab 07/30/17 1358  BNP 4,146.3*     DDimer No results for  input(s): DDIMER in the last 168 hours.   Radiology    No results found.  Cardiac Studies     Patient Profile     67 y.o. male with acute renal insufficiency , hx of CAD and + troponins  Assessment & Plan    1.  Troponin elevation: His troponins remained around 11.  Reviewed notes from [facility] and renal and no anesthesia/access Until heart cleared. He is committed to dialysis at this point Discussed need for cath with patient including risks and willing To proceed. Have arranged for tomorrow with Dr Martin[place]. Indicated given elevated troponin and newly diagnosed decrease in EF to 35-40% with old by pass grafts CABG 2011 (LIMA->LAD; VG->ramus,OM1; VG->PDA, PLSA        Signed, Jenkins Rouge, MD  08/04/2017, 9:52 AM

## 2017-08-04 NOTE — Progress Notes (Signed)
[facility] ROUNDING NOTE   Subjective:   Interval History:   67 y.o.male CAD status post CABG 2011, CKD 4, DM-2,hypertension, GERD, chronic back pain, secondary hyperparathyroidism, anemia of renal disease, gout, bradycardia.He has a baseline creatinine of about 3.8   He was admitted with abdominal pain and dyspnea.  He developed A on CRF and he underwent emergent dialysis for hyperkalemia 5/9  He underwent #3 dialysis 5/11  Due for #4 today.  Needing to have cards workup before can get access surgery- 1200 of UOP overnight but BUN and crt climbing  Seen in HD - says he feels better    Objective:  Vital signs in last 24 hours:  Temp:  [98 F (36.7 C)-99.4 F (37.4 C)] 98.3 F (36.8 C) (05/13 0404) Pulse Rate:  [51-96] 51 (05/13 0404) Resp:  [13-19] 16 (05/13 0404) BP: (149-173)/(69-85) 173/69 (05/13 0404) SpO2:  [96 %-99 %] 96 % (05/13 0404) Weight:  [72.1 kg (158 lb 15.2 oz)] 72.1 kg (158 lb 15.2 oz) (05/13 0500)  Weight change: -1.5 kg (-3 lb 4.9 oz) Filed Weights   08/02/17 2050 08/03/17 0500 08/04/17 0500  Weight: 71.6 kg (157 lb 13.6 oz) 71.3 kg (157 lb 3 oz) 72.1 kg (158 lb 15.2 oz)    Intake/Output: I/O last 3 completed shifts: In: 1520 [P.O.:1320; IV Piggyback:200] Out: 4000 [Urine:2000; Other:2000]   Intake/Output this shift:  No intake/output data recorded. Awake and orientated CVS- RRR  No JVP  RS- CTA ABD- BS present soft non-distended   Dialysis catheter in Right Groin- placed 5/9  Guaze clean  EXT- no edema   Basic Metabolic Panel: Recent Labs  Lab 07/30/17 1836  07/31/17 0935 08/01/17 0305 08/02/17 0249 08/03/17 0221 08/04/17 0251  NA 133*   < > 131* 136 136 138 135  135  K 4.9   < > 6.8* 4.6 3.5 3.4* 3.3*  3.2*  CL 91*   < > 93* 99* 97* 98* 96*  96*  CO2 20*   < > 15* 21* 25 27 26  26   GLUCOSE 228*   < > 256* 144* 179* 170* 184*  185*  BUN 146*   < > 161* 117* 75* 46* 58*  56*  CREATININE 9.73*   < > 10.24* 8.26*  5.33* 3.83* 4.85*  4.74*  CALCIUM 7.2*   < > 6.2* 6.7* 7.1* 7.3* 6.7*  6.7*  PHOS 11.9*  --   --   --   --  3.5 4.9*   < > = values in this interval not displayed.    Liver Function Tests: Recent Labs  Lab 07/30/17 1357  07/31/17 0629 08/01/17 0305 08/02/17 0249 08/03/17 0221 08/04/17 0251  AST 1,408*  --  729* 362* 146*  --  43*  ALT 2,996*  --  2,366* 1,783* 1,237*  --  496*  ALKPHOS 94  --  104 120 124  --  94  BILITOT 1.2  --  0.8 0.7 0.6  --  0.4  PROT 6.4*  --  6.1* 5.4* 5.7*  --  5.1*  ALBUMIN 3.4*   < > 3.2* 2.9* 2.8* 2.7* 2.6*  2.6*   < > = values in this interval not displayed.   Recent Labs  Lab 08/01/17 0305  LIPASE 80*   Recent Labs  Lab 07/30/17 1838  AMMONIA 35    CBC: Recent Labs  Lab 07/30/17 1357 07/31/17 0629 08/01/17 0305 08/02/17 0249 08/03/17 0221 08/04/17 0251  WBC 14.4*  --  13.5* 13.9* 14.2* 13.7*  NEUTROABS 12.1*  --   --   --   --   --   HGB 7.6* 8.1* 8.2* 9.2* 10.0* 9.0*  HCT 22.4* 23.9* 23.9* 27.2* 29.6* 26.5*  MCV 88.9  --  89.2 89.2 89.4 89.2  PLT 77*  --  82* 96* 112* 108*    Cardiac Enzymes: Recent Labs  Lab 07/30/17 1836 07/30/17 2334 07/31/17 0935 07/31/17 1607 07/31/17 2208  TROPONINI 14.06* 11.58* 11.75* 11.03* 11.30*    BNP: Invalid input(s): POCBNP  CBG: Recent Labs  Lab 08/03/17 1027 08/03/17 1325 08/03/17 1641 08/03/17 2058 08/04/17 0724  GLUCAP 301* 302* 243* 270* 138*    Microbiology: Results for orders placed or performed during the hospital encounter of 07/30/17  Culture, blood (routine x 2)     Status: None (Preliminary result)   Collection Time: 07/30/17  8:00 PM  Result Value Ref Range Status   Specimen Description BLOOD RIGHT HAND  Final   Special Requests   Final    BOTTLES DRAWN AEROBIC AND ANAEROBIC Blood Culture adequate volume   Culture   Final    NO GROWTH 4 DAYS Performed at [facility], [place] [address], [place], [place] [address]    Report Status PENDING   Incomplete  Culture, blood (routine x 2)     Status: None (Preliminary result)   Collection Time: 07/30/17  8:27 PM  Result Value Ref Range Status   Specimen Description BLOOD LEFT HAND  Final   Special Requests   Final    BOTTLES DRAWN AEROBIC AND ANAEROBIC Blood Culture adequate volume   Culture   Final    NO GROWTH 4 DAYS Performed at [facility], [place] [address], [place], [place] [address]    Report Status PENDING  Incomplete  MRSA PCR Screening     Status: None   Collection Time: 07/31/17 12:21 AM  Result Value Ref Range Status   MRSA by PCR NEGATIVE NEGATIVE Final    Comment:        The GeneXpert MRSA Assay (FDA approved for NASAL specimens only), is one component of a comprehensive MRSA colonization surveillance program. It is not intended to diagnose MRSA infection nor to guide or monitor treatment for MRSA infections. Performed at [facility], [place] [address], [place], [place] [address]     Coagulation Studies: No results for input(s): LABPROT, INR in the last 72 hours.  Urinalysis: No results for input(s): COLORURINE, LABSPEC, PHURINE, GLUCOSEU, HGBUR, BILIRUBINUR, KETONESUR, PROTEINUR, UROBILINOGEN, NITRITE, LEUKOCYTESUR in the last 72 hours.  Invalid input(s): APPERANCEUR    Imaging: No results found.   Medications:   . sodium chloride    . sodium chloride    . sodium chloride    . cefTRIAXone (ROCEPHIN)  IV Stopped (08/03/17 2036)   . bisacodyl  10 mg Rectal q morning - 10a  . calcium acetate  2,001 mg Oral TID WC  . darbepoetin (ARANESP) injection - DIALYSIS  100 mcg Intravenous Q Sat-HD  . hydrALAZINE  10 mg Oral Q8H  . insulin aspart  0-9 Units Subcutaneous TID WC  . mouth rinse  15 mL Mouth Rinse BID  . tamsulosin  0.4 mg Oral QPC breakfast   sodium chloride, sodium chloride, alteplase, heparin, hydrALAZINE, lidocaine (PF), lidocaine-prilocaine, ondansetron (ZOFRAN) IV, oxyCODONE-acetaminophen, pentafluoroprop-tetrafluoroeth,  polyethylene glycol  Assessment/ Plan:   Acute on chronic renal disease  This may just be progression of his renal failure  Stage 4 at  baseline.  Renal ultrasound showed no hydronephrosis and urinalysis showed proteinuria >300   Plan is to CLIP patient  He still has a temporary femoral dialysis catheter ( placed 5/9) that will need to be removed once [facility] can place a tunneled catheter ( seen by Dr Bridgett Larsson 5/10). Vein mapping was already done 5/10. Access surgery delayed due to need for cards workup.   Plan dialysis #4 today- he did make 1200 of urine but BUN and crt climbing- plan for next HD on Wed- keep on MWF sched for now-  May remove fem cath on Wed after [facility] so can get up and aroung   Hyperkalemia   resolved   K slight decreased today - run on higher K bath  Metabolic acidosis  resolved  Anemia   increased ferritin   Would consider hemochromatosis  He underwent CT scan that had evidence of hepatic steatosis   He has been started on aranesp.  Bones    PTH 121- no meds- phos controlled on phoslo    Combination of transaminitis- improving and also increased ferritin ? hemochromatosis as the diagnosis   per GI   Congestive heart failure  EF 30%  Increased in troponin  - cards workup pre access      LOS: 5 Joseph Mcdonald A @TODAY @7 :36 AM

## 2017-08-04 NOTE — Progress Notes (Addendum)
Progress Note  Patient Name: Joseph Mcdonald Date of Encounter: 08/04/2017  Primary Cardiologist: Dorris Carnes, MD   Subjective   No chest pain or dyspnea.  Inpatient Medications    Scheduled Meds: . bisacodyl  10 mg Rectal q morning - 10a  . calcium acetate  2,001 mg Oral TID WC  . darbepoetin (ARANESP) injection - DIALYSIS  100 mcg Intravenous Q Sat-HD  . hydrALAZINE  10 mg Oral Q8H  . insulin aspart  0-9 Units Subcutaneous TID WC  . mouth rinse  15 mL Mouth Rinse BID  . tamsulosin  0.4 mg Oral QPC breakfast   Continuous Infusions: . sodium chloride    . sodium chloride    . sodium chloride    . cefTRIAXone (ROCEPHIN)  IV Stopped (08/03/17 2036)   PRN Meds: sodium chloride, sodium chloride, alteplase, heparin, hydrALAZINE, lidocaine (PF), lidocaine-prilocaine, ondansetron (ZOFRAN) IV, oxyCODONE-acetaminophen, pentafluoroprop-tetrafluoroeth, polyethylene glycol   Vital Signs    Vitals:   08/04/17 0800 08/04/17 0830 08/04/17 0900 08/04/17 0930  BP: (!) 142/68 137/69 132/68 (!) 149/73  Pulse: 69 62 63 65  Resp: 18 17 16 19   Temp:      TempSrc:      SpO2:      Weight:      Height:        Intake/Output Summary (Last 24 hours) at 08/04/2017 0942 Last data filed at 08/04/2017 0900 Gross per 24 hour  Intake 700 ml  Output 1200 ml  Net -500 ml   Filed Weights   08/03/17 0500 08/04/17 0500 08/04/17 0755  Weight: 157 lb 3 oz (71.3 kg) 158 lb 15.2 oz (72.1 kg) 162 lb 11.2 oz (73.8 kg)    Telemetry    SR at rate of mid 40 to low 60s- Personally Reviewed  ECG    N/A  Physical Exam   GEN: No acute distress.   Neck: No JVD Cardiac: RRR, no murmurs, rubs, or gallops.  Respiratory: Clear to auscultation bilaterally. GI: Soft, nontender, non-distended  MS: No edema; No deformity. Neuro:  Nonfocal  Psych: Normal affect   Labs    Chemistry Recent Labs  Lab 08/01/17 0305 08/02/17 0249 08/03/17 0221 08/04/17 0251  NA 136 136 138 135  135  K  4.6 3.5 3.4* 3.3*  3.2*  CL 99* 97* 98* 96*  96*  CO2 21* 25 27 26  26   GLUCOSE 144* 179* 170* 184*  185*  BUN 117* 75* 46* 58*  56*  CREATININE 8.26* 5.33* 3.83* 4.85*  4.74*  CALCIUM 6.7* 7.1* 7.3* 6.7*  6.7*  PROT 5.4* 5.7*  --  5.1*  ALBUMIN 2.9* 2.8* 2.7* 2.6*  2.6*  AST 362* 146*  --  43*  ALT 1,783* 1,237*  --  496*  ALKPHOS 120 124  --  94  BILITOT 0.7 0.6  --  0.4  GFRNONAA 6* 10* 15* 11*  11*  GFRAA 7* 12* 17* 13*  13*  ANIONGAP 16* 14 13 13  13      Hematology Recent Labs  Lab 08/02/17 0249 08/03/17 0221 08/04/17 0251  WBC 13.9* 14.2* 13.7*  RBC 3.05* 3.31* 2.97*  HGB 9.2* 10.0* 9.0*  HCT 27.2* 29.6* 26.5*  MCV 89.2 89.4 89.2  MCH 30.2 30.2 30.3  MCHC 33.8 33.8 34.0  RDW 13.8 13.7 13.9  PLT 96* 112* 108*    Cardiac Enzymes Recent Labs  Lab 07/30/17 2334 07/31/17 0935 07/31/17 1607 07/31/17 2208  TROPONINI 11.58* 11.75* 11.03* 11.30*  No results for input(s): TROPIPOC in the last 168 hours.   BNP Recent Labs  Lab 07/30/17 1358  BNP 4,146.3*    Radiology    No results found.  Cardiac Studies   Echo 07/31/17 Study Conclusions  - Left ventricle: The cavity size was normal. Wall thickness was   increased in a pattern of mild LVH. Systolic function was   moderately reduced. The estimated ejection fraction was in the   range of 35% to 40%. Diffuse hypokinesis. The study is not   technically sufficient to allow evaluation of LV diastolic   function. - Aortic valve: Sclerosis without stenosis. There was no   regurgitation. Mean gradient (S): 8 mm Hg. - Mitral valve: Mildly thickened leaflets . There was mild   regurgitation. - Right ventricle: The cavity size was mildly dilated. Systolic   function was normal. - Right atrium: Severely dilated. - Tricuspid valve: There was moderate regurgitation. - Pulmonary arteries: PA peak pressure: 37 mm Hg (S). - Inferior vena cava: The vessel was dilated. The respirophasic   diameter  changes were blunted (< 50%), consistent with elevated   central venous pressure.  Impressions:  - Compared to a prior study in 2017, the LVEF is lower at 35-40%   with global hypokinesis, elevated LV and RV filling pressure and   severe RAE.  Patient Profile     Joseph Shi McIntoshis a 67 y.o.malewith a hx of CABG 2011 (LIMA->LAD; VG->ramus,OM1; VG->PDA, PLSA), CKD 4, HTN, DM, obesity, with nephrologist in [place] admitted 07/30/17 for dyspnea and abd pain. Cardiology is asked to see forelevated troponinat the request of Dr. Nori Riis.  Assessment & Plan    1. Elevated troponin with hx of CAD s/p CABG - Peak 11.75. Likely felt demand ischemia. No chest pain.  2. Acute on chronic systolic CHF - He had normal left ventricular systolic function in the past. Echocardiogram this admission revealed diffuse left ventricular hypokinesis with an ejection fraction of 35 to 40%.  - makes very little urine. Volume managed by dialysis. BNP of 4146. Net I & O of -7.9L. Weight down 3 lb (165-->162lb).   3. Pre-op clearance - Plan for [facility] and L arm access placement this week. LVEF has been reduced from prior with hypokinesis. He has no chest pain. Ischemic eval per Dr. Johnsie Cancel.   4. Acute on chronic renal failure stage IV - Per nephrology. Plan for fistula placement this admission. Currently undergoing temporary dialysis.   For questions or updates, please contact [place] Please consult [email or web address] for contact info under Cardiology/STEMI.      Jarrett Soho, PA  08/04/2017, 9:42 AM    See my note from same day   Jenkins Rouge

## 2017-08-04 NOTE — Procedures (Signed)
Patient was seen on dialysis and the procedure was supervised.  BFR 400  Via vascath BP is  155/78.   Patient appears to be tolerating treatment well  Deette Revak A 08/04/2017

## 2017-08-04 NOTE — Progress Notes (Addendum)
Family Medicine Teaching Service Daily Progress Note Intern Pager: [phone]  Patient name: Joseph Mcdonald Medical record number: [id number] Date of birth: 1950-08-28 Age: 67 y.o. Gender: male  Primary Care Provider: Mercy Riding, MD Consultants: Nephrology, Cardiology Code Status: Full  Pt Overview and Major Events to Date:  5/08 Admitted 5/09 HD #1 5/10 [facility] vein mapping, CT abd/pelvis neg 5/11 HD #2, on room air 5/13 cath scheduled  Assessment and Plan: Joseph Mcdonald is a 67 y.o. male presenting with dyspnea and abdominal pain. PMH is significant for CAD status post CABG 2011, CKD 4, DM-2, hypertension, GERD, chronic back pain, secondary hyperparathyroidism, anemia of renal disease, gout, bradycardia.   1.   New onset HFrEF: on room air.  Thought to be related to fluid overload in the setting of HF exacerbation given worsening EF 30% down from prior, 55-60%.  Remains off diuretics and improving with HD for fluid management.  Patient has now progressed to ARF and will likely not improve.  Currently awaiting AVF establishment and heart cath. -Nephrology consulted, appreciate recommendations, s/p HD 5/9, 5/11, 5/13, next likely 5/15  -Cardiology consulted, appreciate recommendations- to have cath today -NPO since midnight 5/13 for cath -Daily weights, I&O's -Telemetry  2.  Elevated transaminases: Pain resolved, Patient continues to have bowel movements.  Hepatic panel negative.  GI consulted considering hemochromatosis work-up given negative CT abdomen and pelvis and right upper quadrant ultrasound.  Transaminases improving. - GI consulted, appreciate recommendations- after cath, could consider MRI w/o contrast vs jugular liver biopsy. -GI will remain available for phone call - Continue renal diet  3.  Anemia of chronic inflammation  Thrombocytopenia  Iron overload: stable @ Hemoglobin 9.0  and platelets stable ~100.  Has femoral cath for HD access.  Does have  signs of iron overload.  Possibly hemochromatosis - GI following. - Continue monitoring CBC  4.  Coronary arterial disease: History of CABG in 2011.  No signs of ACS on exam.  Troponins plateaued to 11.  Cardiology following - Heart catheterization per cardiology 5/14  5.  Acute on chronic CKD 4  Hyperkalemia  Anion gap metabolic acidosis: Hyperkalemia and metabolic acidosis resolved following HD.  Has progressed to ESRD.  Nephrology currently working on obtaining permanent access. - Plan per problem #1  6.  Diabetes mellitus type 2: Chronic.  A1c 7.2.  Takes Lantus and Victoza at home.  CBGs in the 400s. -Sensitive sliding scale with at bedtime coverage -evaluate for starting lantus based on short acting need for 5/14  7.  Hypertension: Chronic.  Improved following HD. - Continue hydralazine as needed and continue 10 mg TID  8.  Urinary retention: Likely related to BPH indicated on CT abdomen and pelvis. - Initiating Flomax - Foley cath in and out as needed following bladder scans  FEN/GI: Renal diet PPx: SCDs   Disposition:  Plan to continue HD and pursue fistula access.  Heart cath 5/14.  Subjective:  Only complaint is chronic back pain, no belly/chest pain  Objective: Temp:  [98.3 F (36.8 C)-98.6 F (37 C)] 98.4 F (36.9 C) (05/13 1618) Pulse Rate:  [51-96] 52 (05/13 1618) Resp:  [15-21] 17 (05/13 1618) BP: (118-173)/(67-89) 163/68 (05/13 1618) SpO2:  [96 %-99 %] 99 % (05/13 1618) Weight:  [156 lb 1.4 oz (70.8 kg)-160 lb 7.9 oz (72.8 kg)] 156 lb 1.4 oz (70.8 kg) (05/13 1200) Physical Exam: General: NAD, pleasant, was sleeping but was easily arousable Cardiovascular: RRR, 2/6 murmur, no LE edema Respiratory:  CTA BL, no IWB Gastrointestinal: soft, nontender, nondistended, normoactive BS MSK: moves 4 extremities equally Derm: no rashes appreciated Neuro: CN II-XII grossly intact Psych: AO, appropriate affect, pleasant  Laboratory: Recent Labs  Lab  08/02/17 0249 08/03/17 0221 08/04/17 0251  WBC 13.9* 14.2* 13.7*  HGB 9.2* 10.0* 9.0*  HCT 27.2* 29.6* 26.5*  PLT 96* 112* 108*   Recent Labs  Lab 08/01/17 0305 08/02/17 0249 08/03/17 0221 08/04/17 0251  NA 136 136 138 135  135  K 4.6 3.5 3.4* 3.3*  3.2*  CL 99* 97* 98* 96*  96*  CO2 21* 25 27 26  26   BUN 117* 75* 46* 58*  56*  CREATININE 8.26* 5.33* 3.83* 4.85*  4.74*  CALCIUM 6.7* 7.1* 7.3* 6.7*  6.7*  PROT 5.4* 5.7*  --  5.1*  BILITOT 0.7 0.6  --  0.4  ALKPHOS 120 124  --  94  ALT 1,783* 1,237*  --  496*  AST 362* 146*  --  43*  GLUCOSE 144* 179* 170* 184*  185*   5/11 ANA neg 5/11 Ceruloplasmin 29.7 (wnl) 5/11 PTH: 121 5/11 IgG 585, IgA 264, IgM 48 5/10 GGT: 256 5/10 Lipase: 80 < 89 5/10 FOBT: Positive 5/09 Troponin I: 11.3 <11.0 <11.75 <11.58 5/09 HBV panel: Negative 5/09 Ferritin: >7,500 5/08 UA: Moderate hemoglobin, 150 glucose, >300 protein 5/08 Blood cultures x2: NGTD 5/08 Ethanol: Negative 5/08 Acetaminophen level: Negative 5/08 Ammonia: 35 5/08 Reticulocytes: 2.44 5/08 Iron studies: Iron 230, TIBC 238, saturation 97% 5/08 Folate: 11.7 5/08 B12: 4551 5/08 Hepatitis panel: Negative 5/08 INR: 1.71 5/08 BNP: 4146 (1057, 1 year ago)  Imaging/Diagnostic Tests: CT ABDOMEN PELVIS W CONTRAST (08/01/2017) 1. Small right pleural effusion and minimal left pleural effusion. 2. Mild bilateral lower lobe dependent atelectasis. 3. Cardiomegaly. 4. Mild diffuse hepatic steatosis. 5. Small amount of air in the urinary bladder, most likely due to recent catheterization. 6.  Calcific coronary artery and aortic atherosclerosis. 7. Mild prostatic hypertrophy.  ABDOMEN ULTRASOUND COMPLETE (07/30/2017) Unremarkable ultrasound of the abdomen. No acute intra-abdominal findings. Uncomplicated appearing renal cystic disease without hydronephrosis or solid mass. Hepatic steatosis.  No gallstones or biliary ductal dilatation.  CHEST - 2 (07/30/2017) Cardiac  enlargement and low bilateral lung volumes.   Sherene Sires, DO 08/04/2017, 9:11 PM PGY-1, [place] Intern pager: [phone], text pages welcome

## 2017-08-05 ENCOUNTER — Encounter (HOSPITAL_COMMUNITY): Payer: Self-pay | Admitting: Cardiology

## 2017-08-05 ENCOUNTER — Inpatient Hospital Stay (HOSPITAL_COMMUNITY)
Admission: EM | Disposition: A | Discharge status: Home Health | Payer: Self-pay | Source: Home / Self Care | Attending: Family Medicine

## 2017-08-05 DIAGNOSIS — I2510 Atherosclerotic heart disease of native coronary artery without angina pectoris: Secondary | ICD-10-CM

## 2017-08-05 DIAGNOSIS — N184 Chronic kidney disease, stage 4 (severe): Secondary | ICD-10-CM

## 2017-08-05 DIAGNOSIS — I10 Essential (primary) hypertension: Secondary | ICD-10-CM

## 2017-08-05 DIAGNOSIS — D509 Iron deficiency anemia, unspecified: Secondary | ICD-10-CM

## 2017-08-05 DIAGNOSIS — I5021 Acute systolic (congestive) heart failure: Secondary | ICD-10-CM

## 2017-08-05 DIAGNOSIS — E1122 Type 2 diabetes mellitus with diabetic chronic kidney disease: Secondary | ICD-10-CM

## 2017-08-05 DIAGNOSIS — N170 Acute kidney failure with tubular necrosis: Secondary | ICD-10-CM

## 2017-08-05 HISTORY — PX: LEFT HEART CATH AND CORS/GRAFTS ANGIOGRAPHY: CATH118250

## 2017-08-05 LAB — GLUCOSE, CAPILLARY
GLUCOSE-CAPILLARY: 276 mg/dL — AB (ref 65–99)
GLUCOSE-CAPILLARY: 150 mg/dL — AB (ref 65–99)
GLUCOSE-CAPILLARY: 141 mg/dL — AB (ref 65–99)
GLUCOSE-CAPILLARY: 333 mg/dL — AB (ref 65–99)
GLUCOSE-CAPILLARY: 269 mg/dL — AB (ref 65–99)
Glucose-Capillary: 140 mg/dL — ABNORMAL HIGH (ref 65–99)
Glucose-Capillary: 191 mg/dL — ABNORMAL HIGH (ref 65–99)

## 2017-08-05 LAB — COMPREHENSIVE METABOLIC PANEL
ALBUMIN: 2.5 g/dL — AB (ref 3.5–5.0)
ALT: 355 U/L — AB (ref 17–63)
AST: 45 U/L — AB (ref 15–41)
Alkaline Phosphatase: 96 U/L (ref 38–126)
Anion gap: 10 (ref 5–15)
BUN: 33 mg/dL — AB (ref 6–20)
CHLORIDE: 97 mmol/L — AB (ref 101–111)
CO2: 26 mmol/L (ref 22–32)
CREATININE: 3.19 mg/dL — AB (ref 0.61–1.24)
Calcium: 6.7 mg/dL — ABNORMAL LOW (ref 8.9–10.3)
GFR calc Af Amer: 21 mL/min — ABNORMAL LOW (ref >60)
GFR calc non Af Amer: 19 mL/min — ABNORMAL LOW (ref >60)
Glucose, Bld: 225 mg/dL — ABNORMAL HIGH (ref 65–99)
Potassium: 3.6 mmol/L (ref 3.5–5.1)
SODIUM: 133 mmol/L — AB (ref 135–145)
Total Bilirubin: 0.5 mg/dL (ref 0.3–1.2)
Total Protein: 5.1 g/dL — ABNORMAL LOW (ref 6.5–8.1)

## 2017-08-05 LAB — CBC
HCT: 26.2 % — ABNORMAL LOW (ref 39.0–52.0)
Hemoglobin: 9 g/dL — ABNORMAL LOW (ref 13.0–17.0)
MCH: 30.8 pg (ref 26.0–34.0)
MCHC: 34.4 g/dL (ref 30.0–36.0)
MCV: 89.7 fL (ref 78.0–100.0)
Platelets: 100 10*3/uL — ABNORMAL LOW (ref 150–400)
RBC: 2.92 MIL/uL — ABNORMAL LOW (ref 4.22–5.81)
RDW: 14.1 % (ref 11.5–15.5)
WBC: 11.8 10*3/uL — ABNORMAL HIGH (ref 4.0–10.5)

## 2017-08-05 LAB — SURGICAL PCR SCREEN
MRSA, PCR: NEGATIVE
Staphylococcus aureus: POSITIVE — AB

## 2017-08-05 SURGERY — LEFT HEART CATH AND CORS/GRAFTS ANGIOGRAPHY
Anesthesia: LOCAL

## 2017-08-05 MED ORDER — SODIUM CHLORIDE 0.9% FLUSH
3.0000 mL | Freq: Two times a day (BID) | INTRAVENOUS | Status: DC
  Administered 2017-08-05 – 2017-08-09 (×7): 3 mL via INTRAVENOUS

## 2017-08-05 MED ORDER — SODIUM CHLORIDE 0.9% FLUSH: 3.0000 mL | INTRAVENOUS | Status: DC | PRN

## 2017-08-05 MED ORDER — HYDRALAZINE HCL 20 MG/ML IJ SOLN
INTRAMUSCULAR | Status: DC | PRN
  Administered 2017-08-05: 10 mg via INTRAVENOUS

## 2017-08-05 MED ORDER — LIDOCAINE HCL (PF) 1 % IJ SOLN
INTRAMUSCULAR | Status: DC | PRN
  Administered 2017-08-05: 30 mL

## 2017-08-05 MED ORDER — MIDAZOLAM HCL 2 MG/2ML IJ SOLN
INTRAMUSCULAR | Status: DC | PRN
  Administered 2017-08-05: 1 mg via INTRAVENOUS

## 2017-08-05 MED ORDER — FENTANYL CITRATE (PF) 100 MCG/2ML IJ SOLN
INTRAMUSCULAR | Status: DC | PRN
  Administered 2017-08-05: 25 ug via INTRAVENOUS

## 2017-08-05 MED ORDER — FENTANYL CITRATE (PF) 100 MCG/2ML IJ SOLN
INTRAMUSCULAR | Status: AC
  Filled 2017-08-05: qty 2

## 2017-08-05 MED ORDER — HYDRALAZINE HCL 20 MG/ML IJ SOLN
INTRAMUSCULAR | Status: AC
  Filled 2017-08-05: qty 1

## 2017-08-05 MED ORDER — ASPIRIN 81 MG PO CHEW
81.0000 mg | CHEWABLE_TABLET | ORAL | Status: AC
  Administered 2017-08-05: 81 mg via ORAL
  Filled 2017-08-05: qty 1

## 2017-08-05 MED ORDER — ASPIRIN 81 MG PO CHEW: 81.0000 mg | CHEWABLE_TABLET | ORAL | Status: DC

## 2017-08-05 MED ORDER — IOPAMIDOL (ISOVUE-370) INJECTION 76%
INTRAVENOUS | Status: DC | PRN
  Administered 2017-08-05: 85 mL via INTRA_ARTERIAL

## 2017-08-05 MED ORDER — SODIUM CHLORIDE 0.9 % IV SOLN
250.0000 mL | INTRAVENOUS | Status: DC | PRN
  Administered 2017-08-08: 13:00:00 via INTRAVENOUS
  Administered 2017-08-08: 500 mL via INTRAVENOUS

## 2017-08-05 MED ORDER — SODIUM CHLORIDE 0.9 % IV SOLN: 250.0000 mL | INTRAVENOUS | Status: DC | PRN

## 2017-08-05 MED ORDER — HEPARIN (PORCINE) IN NACL 1000-0.9 UT/500ML-% IV SOLN
INTRAVENOUS | Status: AC
  Filled 2017-08-05: qty 500

## 2017-08-05 MED ORDER — SODIUM CHLORIDE 0.9 % IV SOLN
INTRAVENOUS | Status: DC
  Administered 2017-08-05: 06:00:00 via INTRAVENOUS

## 2017-08-05 MED ORDER — MIDAZOLAM HCL 2 MG/2ML IJ SOLN
INTRAMUSCULAR | Status: AC
  Filled 2017-08-05: qty 2

## 2017-08-05 MED ORDER — IOPAMIDOL (ISOVUE-370) INJECTION 76%
INTRAVENOUS | Status: AC
  Filled 2017-08-05: qty 125

## 2017-08-05 MED ORDER — SODIUM CHLORIDE 0.9 % IV SOLN: INTRAVENOUS | Status: DC

## 2017-08-05 MED ORDER — SODIUM CHLORIDE 0.9% FLUSH
3.0000 mL | Freq: Two times a day (BID) | INTRAVENOUS | Status: DC
  Administered 2017-08-05: 3 mL via INTRAVENOUS

## 2017-08-05 MED ORDER — LIDOCAINE HCL (PF) 1 % IJ SOLN
INTRAMUSCULAR | Status: AC
  Filled 2017-08-05: qty 30

## 2017-08-05 SURGICAL SUPPLY — 12 items
CATH EXPO 5F MPA-1 (CATHETERS) ×2 IMPLANT
CATH INFINITI 5 FR LCB (CATHETERS) ×2 IMPLANT
CATH INFINITI 5FR AL1 (CATHETERS) ×2 IMPLANT
CATH INFINITI JR4 5F (CATHETERS) ×2 IMPLANT
CATH INFINITI MULTIPACK ST 5F (CATHETERS) ×2 IMPLANT
KIT HEART LEFT (KITS) ×2 IMPLANT
PACK CARDIAC CATHETERIZATION (CUSTOM PROCEDURE TRAY) ×2 IMPLANT
SHEATH PINNACLE 5F 10CM (SHEATH) ×2 IMPLANT
TRANSDUCER W/STOPCOCK (MISCELLANEOUS) ×2 IMPLANT
TUBING CIL FLEX 10 FLL-RA (TUBING) ×2 IMPLANT
WIRE EMERALD 3MM-J .035X150CM (WIRE) ×2 IMPLANT
WIRE HI TORQ VERSACORE-J 145CM (WIRE) ×2 IMPLANT

## 2017-08-05 NOTE — Interval H&P Note (Signed)
History and Physical Interval Note:  08/05/2017 7:26 AM  Joseph Mcdonald  has presented today for surgery, with the diagnosis of cad  The various methods of treatment have been discussed with the patient and family. After consideration of risks, benefits and other options for treatment, the patient has consented to  Procedure(s): LEFT HEART CATH AND CORS/GRAFTS ANGIOGRAPHY (N/A) as a surgical intervention .  The patient's history has been reviewed, patient examined, no change in status, stable for surgery.  I have reviewed the patient's chart and labs.  Questions were answered to the patient's satisfaction.   Cath Lab Visit (complete for each Cath Lab visit)  Clinical Evaluation Leading to the Procedure:   ACS: No.  Non-ACS:    Anginal Classification: CCS I  Anti-ischemic medical therapy: No Therapy  Non-Invasive Test Results: No non-invasive testing performed  Prior CABG: Previous CABG        [name] 08/05/2017  7:26 AM

## 2017-08-05 NOTE — Progress Notes (Addendum)
Site area: LFA Site Prior to Removal:  Level 0 Pressure Applied For:20 min Manual:   yes Patient Status During Pull:  stable Post Pull Site:  Level 0 Post Pull Instructions Given:  yes Post Pull Pulses Present: palpable L DP Dressing Applied:  Clear/gauze Bedrest begins @ 4932 till 1315 Comments:

## 2017-08-05 NOTE — Progress Notes (Signed)
Progress Note  Patient Name: Joseph Mcdonald Date of Encounter: 08/05/2017  Primary Cardiologist: Dorris Carnes, MD   Subjective   No complaints for cath this am  Had dialysis yesterday    Joseph Mcdonald is a 67 y.o. 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 Nori Riis.  Echo cardiogram shows moderately reduced left ventricular systolic function with an EF of 35 to 40%.  He has mild pulmonary hypertension with an estimated PA pressure of 37.  Seen in dialysis today   Inpatient Medications    Scheduled Meds: . [MAR Hold] bisacodyl  10 mg Rectal q morning - 10a  . [MAR Hold] calcium acetate  2,001 mg Oral TID WC  . [MAR Hold] darbepoetin (ARANESP) injection - DIALYSIS  100 mcg Intravenous Q Sat-HD  . [MAR Hold] hydrALAZINE  10 mg Oral Q8H  . [MAR Hold] insulin aspart  0-9 Units Subcutaneous TID WC  . [MAR Hold] mouth rinse  15 mL Mouth Rinse BID  . sodium chloride flush  3 mL Intravenous Q12H  . [MAR Hold] tamsulosin  0.4 mg Oral QPC breakfast   Continuous Infusions: . [MAR Hold] sodium chloride    . sodium chloride    . sodium chloride 10 mL/hr at 08/05/17 0536  . [MAR Hold] cefTRIAXone (ROCEPHIN)  IV Stopped (08/04/17 2039)   PRN Meds: sodium chloride, fentaNYL, [MAR Hold] hydrALAZINE, lidocaine (PF), midazolam, [MAR Hold] ondansetron (ZOFRAN) IV, [MAR Hold] oxyCODONE-acetaminophen, [MAR Hold] polyethylene glycol, sodium chloride flush   Vital Signs    Vitals:   08/05/17 0300 08/05/17 0500 08/05/17 0536 08/05/17 0737  BP: (!) 164/80  (!) 160/66   Pulse: 60  (!) 53   Resp: 20     Temp: 98.5 F (36.9 C)     TempSrc: Oral     SpO2: 100%   99%  Weight:  158 lb 11.7 oz (72 kg)    Height:        Intake/Output Summary (Last 24 hours) at 08/05/2017  0816 Last data filed at 08/05/2017 0700 Gross per 24 hour  Intake 1074 ml  Output 2825 ml  Net -1751 ml   Filed Weights   08/04/17 0755 08/04/17 1200 08/05/17 0500  Weight: 160 lb 7.9 oz (72.8 kg) 156 lb 1.4 oz (70.8 kg) 158 lb 11.7 oz (72 kg)    Telemetry    NSR  - Personally Reviewed  ECG     NSR  - Personally Reviewed  Physical Exam   Physical Exam: Blood pressure (!) 160/66, pulse (!) 53, temperature 98.5 F (36.9 C), temperature source Oral, resp. rate 20, height 5\' 4"  (1.626 m), weight 158 lb 11.7 oz (72 kg), SpO2 99 %.  Affect appropriate Chronically ill white male  HEENT: normal Neck supple with no adenopathy JVP normal no bruits no thyromegaly Lungs clear with no wheezing and good diaphragmatic motion Heart:  S1/S2 SEM  murmur, no rub, gallop or click PMI normal Abdomen: benighn, BS positve, no tenderness, no AAA no bruit.  No HSM or HJR Distal pulses intact with no bruits No edema Neuro non-focal Skin warm and dry No muscular weakness Dialysis catheter in [place]    Chemistry Recent Labs  Lab 08/02/17 0249 08/03/17 0221 08/04/17 0251 08/05/17 0220  NA 136  138 135  135 133*  K 3.5 3.4* 3.3*  3.2* 3.6  CL 97* 98* 96*  96* 97*  CO2 25 27 26  26 26   GLUCOSE 179* 170* 184*  185* 225*  BUN 75* 46* 58*  56* 33*  CREATININE 5.33* 3.83* 4.85*  4.74* 3.19*  CALCIUM 7.1* 7.3* 6.7*  6.7* 6.7*  PROT 5.7*  --  5.1* 5.1*  ALBUMIN 2.8* 2.7* 2.6*  2.6* 2.5*  AST 146*  --  43* 45*  ALT 1,237*  --  496* 355*  ALKPHOS 124  --  94 96  BILITOT 0.6  --  0.4 0.5  GFRNONAA 10* 15* 11*  11* 19*  GFRAA 12* 17* 13*  13* 21*  ANIONGAP 14 13 13  13 10      Hematology Recent Labs  Lab 08/03/17 0221 08/04/17 0251 08/05/17 0220  WBC 14.2* 13.7* 11.8*  RBC 3.31* 2.97* 2.92*  HGB 10.0* 9.0* 9.0*  HCT 29.6* 26.5* 26.2*  MCV 89.4 89.2 89.7  MCH 30.2 30.3 30.8  MCHC 33.8 34.0 34.4  RDW 13.7 13.9 14.1  PLT 112* 108* 100*    Cardiac  Enzymes Recent Labs  Lab 07/30/17 2334 07/31/17 0935 07/31/17 1607 07/31/17 2208  TROPONINI 11.58* 11.75* 11.03* 11.30*   No results for input(s): TROPIPOC in the last 168 hours.   BNP Recent Labs  Lab 07/30/17 1358  BNP 4,146.3*     DDimer No results for input(s): DDIMER in the last 168 hours.   Radiology    No results found.  Cardiac Studies   Echo EF 35-40%    Patient Profile     67 y.o. male with acute renal insufficiency , hx of CAD and + troponins TTE with EF Newly down 35-40% peak troponin 11 range   Assessment & Plan    1.  Troponin elevation: His troponins remained around 11.  Reviewed notes from [facility] and renal and no anesthesia/access Until heart cleared. He is committed to dialysis at this point Cath to be done this am n EF to 35-40% with old by pass grafts CABG 2011 (LIMA->LAD; VG->ramus,OM1; VG->PDA, PLSA  2. Anemia:  aranasp per renal likely from CRF  3. HTN:  Continue hydralazine   4. DM:  Discussed low carb diet.  Target hemoglobin A1c is 6.5 or less.  Continue current medications.        Signed, Jenkins Rouge, MD  08/05/2017, 8:16 AM

## 2017-08-05 NOTE — Progress Notes (Signed)
Inpatient Diabetes Program Recommendations  AACE/ADA: New Consensus Statement on Inpatient Glycemic Control (2015)  Target Ranges:  Prepandial:   less than 140 mg/dL      Peak postprandial:   less than 180 mg/dL (1-2 hours)      Critically ill patients:  140 - 180 mg/dL   Lab Results  Component Value Date   GLUCAP 191 (H) 08/05/2017   HGBA1C 7.2 07/08/2017    Review of Glycemic Control Results for Joseph Mcdonald, Joseph Mcdonald (MRN [id number]) as of 08/05/2017 11:51  Ref. Range 08/04/2017 16:01 08/04/2017 16:47 08/05/2017 07:24 08/05/2017 08:53 08/05/2017 11:35  Glucose-Capillary Latest Ref Range: 65 - 99 mg/dL 401 (H) 408 (H) 140 (H) 141 (H) 191 (H)   Diabetes history: Type 2 Outpatient Diabetes medications: Basalglar 20 units daily, Victoza 1.2 mg daily; 1.8 mg daily if CBG >140 mg/dl. Current orders for Inpatient glycemic control: Novolog SENSITIVE TID  Inpatient Diabetes Program Recommendations:   -Lantus 15 units daily  Thank you, Nani Gasser. Dotti Busey, RN, MSN, CDE  Diabetes Coordinator Inpatient Glycemic Control Team Team Pager [phone] (8am-5pm) 08/05/2017 11:52 AM

## 2017-08-05 NOTE — Progress Notes (Signed)
Subjective:  S/p HD 5/13 - removed 2000 tolerated well - had heart cath today with intervention Objective Vital signs in last 24 hours: Vitals:   08/05/17 1101 08/05/17 1132 08/05/17 1143 08/05/17 1234  BP: (!) 161/78 133/61 (!) 147/63 (!) 153/72  Pulse: 74 70 69 75  Resp: (!) 22 20 17 18   Temp:  98.4 F (36.9 C)    TempSrc:  Oral    SpO2: 98% 97% 97% 100%  Weight:      Height:       Weight change: 0.7 kg (1 lb 8.7 oz)  Intake/Output Summary (Last 24 hours) at 08/05/2017 1241 Last data filed at 08/05/2017 1100 Gross per 24 hour  Intake 714 ml  Output 825 ml  Net -111 ml    Assessment/ Plan: Pt is a 67 y.o. yo male DM, HTN, CAD, and baseline CKD 4 who was admitted on 07/30/2017 with [facility] CRF req emergent HD on 5/9 and has cont to require HD- making plans for new start to chronic HD  Assessment/Plan: 1. Renal- s/p HD times 4- been doing on MWF schedule- making urine but numbers worsening in between HD treatments-  has temp fem HD cath placed 5/9.  Plan to place [facility] on FRiday- so will remove fem cath after HD in AM.  The plan for perm access depends on cards workup.  Low K on higher K bath.  CLIP in process -  2. HTN/vol-  EF 30% cards work up in process.  Volume status OK - on hydralazine- since commited to HD may want to change to long acting ARB 3. Anemia- considering hemachromatosis- per primary/GI- on aranesp 100 q Sat  4. Secondary hyperparathyroidism- PTH 121 no meds- phos well controlled on phoslo  5. Low platelets- trending up some- no heparin with HD for now- dont see where HIT has been checked    Ornella Coderre A    Labs: Basic Metabolic Panel: Recent Labs  Lab 07/30/17 1836  08/03/17 0221 08/04/17 0251 08/05/17 0220  NA 133*   < > 138 135  135 133*  K 4.9   < > 3.4* 3.3*  3.2* 3.6  CL 91*   < > 98* 96*  96* 97*  CO2 20*   < > 27 26  26 26   GLUCOSE 228*   < > 170* 184*  185* 225*  BUN 146*   < > 46* 58*  56* 33*  CREATININE 9.73*   < > 3.83* 4.85*   4.74* 3.19*  CALCIUM 7.2*   < > 7.3* 6.7*  6.7* 6.7*  PHOS 11.9*  --  3.5 4.9*  --    < > = values in this interval not displayed.   Liver Function Tests: Recent Labs  Lab 08/02/17 0249 08/03/17 0221 08/04/17 0251 08/05/17 0220  AST 146*  --  43* 45*  ALT 1,237*  --  496* 355*  ALKPHOS 124  --  94 96  BILITOT 0.6  --  0.4 0.5  PROT 5.7*  --  5.1* 5.1*  ALBUMIN 2.8* 2.7* 2.6*  2.6* 2.5*   Recent Labs  Lab 08/01/17 0305  LIPASE 80*   Recent Labs  Lab 07/30/17 1838  AMMONIA 35   CBC: Recent Labs  Lab 07/30/17 1357  08/01/17 0305 08/02/17 0249 08/03/17 0221 08/04/17 0251 08/05/17 0220  WBC 14.4*  --  13.5* 13.9* 14.2* 13.7* 11.8*  NEUTROABS 12.1*  --   --   --   --   --   --  HGB 7.6*   < > 8.2* 9.2* 10.0* 9.0* 9.0*  HCT 22.4*   < > 23.9* 27.2* 29.6* 26.5* 26.2*  MCV 88.9  --  89.2 89.2 89.4 89.2 89.7  PLT 77*  --  82* 96* 112* 108* 100*   < > = values in this interval not displayed.   Cardiac Enzymes: Recent Labs  Lab 07/30/17 1836 07/30/17 2334 07/31/17 0935 07/31/17 1607 07/31/17 2208  TROPONINI 14.06* 11.58* 11.75* 11.03* 11.30*   CBG: Recent Labs  Lab 08/04/17 1601 08/04/17 1647 08/05/17 0724 08/05/17 0853 08/05/17 1135  GLUCAP 401* 408* 140* 141* 191*    Iron Studies: No results for input(s): IRON, TIBC, TRANSFERRIN, FERRITIN in the last 72 hours. Studies/Results: No results found. Medications: Infusions: . sodium chloride    . sodium chloride    . cefTRIAXone (ROCEPHIN)  IV Stopped (08/04/17 2039)    Scheduled Medications: . bisacodyl  10 mg Rectal q morning - 10a  . calcium acetate  2,001 mg Oral TID WC  . darbepoetin (ARANESP) injection - DIALYSIS  100 mcg Intravenous Q Sat-HD  . hydrALAZINE  10 mg Oral Q8H  . insulin aspart  0-9 Units Subcutaneous TID WC  . mouth rinse  15 mL Mouth Rinse BID  . sodium chloride flush  3 mL Intravenous Q12H  . tamsulosin  0.4 mg Oral QPC breakfast    have reviewed scheduled and prn  medications.  Physical Exam: General: NAD Heart: RRR Lungs: mostly clear Abdomen: soft, non tender Extremities: min edema Dialysis Access: femoral vascath placed 5/9    08/05/2017,12:41 PM  LOS: 6 days

## 2017-08-05 NOTE — Progress Notes (Signed)
Have notified on call for Cardiology that patient is having pauses with the longest recorded at 1.81.  The patient's rhythm is bradycardia with a first degree block and the patient has been and remains asymptomatic; continuing to monitor patient.

## 2017-08-06 DIAGNOSIS — I5023 Acute on chronic systolic (congestive) heart failure: Secondary | ICD-10-CM

## 2017-08-06 LAB — COMPREHENSIVE METABOLIC PANEL
ALT: 237 U/L — AB (ref 17–63)
AST: 33 U/L (ref 15–41)
Albumin: 2.6 g/dL — ABNORMAL LOW (ref 3.5–5.0)
Alkaline Phosphatase: 92 U/L (ref 38–126)
Anion gap: 13 (ref 5–15)
BUN: 48 mg/dL — ABNORMAL HIGH (ref 6–20)
CHLORIDE: 94 mmol/L — AB (ref 101–111)
CO2: 22 mmol/L (ref 22–32)
CREATININE: 4.59 mg/dL — AB (ref 0.61–1.24)
Calcium: 6.5 mg/dL — ABNORMAL LOW (ref 8.9–10.3)
GFR calc Af Amer: 14 mL/min — ABNORMAL LOW (ref >60)
GFR calc non Af Amer: 12 mL/min — ABNORMAL LOW (ref >60)
GLUCOSE: 142 mg/dL — AB (ref 65–99)
Potassium: 3.4 mmol/L — ABNORMAL LOW (ref 3.5–5.1)
SODIUM: 129 mmol/L — AB (ref 135–145)
Total Bilirubin: 0.4 mg/dL (ref 0.3–1.2)
Total Protein: 5.2 g/dL — ABNORMAL LOW (ref 6.5–8.1)

## 2017-08-06 LAB — CBC
HCT: 25.4 % — ABNORMAL LOW (ref 39.0–52.0)
Hemoglobin: 8.6 g/dL — ABNORMAL LOW (ref 13.0–17.0)
MCH: 30.4 pg (ref 26.0–34.0)
MCHC: 33.9 g/dL (ref 30.0–36.0)
MCV: 89.8 fL (ref 78.0–100.0)
PLATELETS: 133 10*3/uL — AB (ref 150–400)
RBC: 2.83 MIL/uL — ABNORMAL LOW (ref 4.22–5.81)
RDW: 13.8 % (ref 11.5–15.5)
WBC: 12.4 10*3/uL — ABNORMAL HIGH (ref 4.0–10.5)

## 2017-08-06 LAB — GLUCOSE, CAPILLARY
Glucose-Capillary: 109 mg/dL — ABNORMAL HIGH (ref 65–99)
Glucose-Capillary: 224 mg/dL — ABNORMAL HIGH (ref 65–99)
Glucose-Capillary: 341 mg/dL — ABNORMAL HIGH (ref 65–99)

## 2017-08-06 MED ORDER — LOSARTAN POTASSIUM 50 MG PO TABS
50.0000 mg | ORAL_TABLET | Freq: Every day | ORAL | Status: DC
  Administered 2017-08-06 – 2017-08-08 (×3): 50 mg via ORAL
  Filled 2017-08-06 (×3): qty 1

## 2017-08-06 MED ORDER — LOSARTAN POTASSIUM 50 MG PO TABS
50.0000 mg | ORAL_TABLET | Freq: Every day | ORAL | Status: DC
  Filled 2017-08-06: qty 1

## 2017-08-06 MED ORDER — ISOSORBIDE MONONITRATE ER 30 MG PO TB24
15.0000 mg | ORAL_TABLET | Freq: Every day | ORAL | Status: DC
Start: 2017-08-06 — End: 2017-08-09
  Administered 2017-08-06 – 2017-08-09 (×3): 15 mg via ORAL
  Filled 2017-08-06 (×3): qty 1

## 2017-08-06 MED ORDER — INSULIN ASPART 100 UNIT/ML ~~LOC~~ SOLN
0.0000 [IU] | Freq: Every day | SUBCUTANEOUS | Status: DC
  Administered 2017-08-06: 4 [IU] via SUBCUTANEOUS
  Administered 2017-08-07: 3 [IU] via SUBCUTANEOUS

## 2017-08-06 MED FILL — Heparin Sod (Porcine)-NaCl IV Soln 1000 Unit/500ML-0.9%: INTRAVENOUS | Qty: 500 | Status: AC

## 2017-08-06 NOTE — Progress Notes (Signed)
Accepted at [facility] . 1st treatment Saturday Aug 09 2017 at 06:20 .Schedule and chair time Tues,Thurs,Sat at 06:20

## 2017-08-06 NOTE — Progress Notes (Signed)
Subjective:  Seen on HD- had heart cath yest with intervention- no new c/o's  Objective Vital signs in last 24 hours: Vitals:   08/06/17 0701 08/06/17 0720 08/06/17 0731 08/06/17 0800  BP:  (!) 159/78 (!) 159/74 (!) 161/77  Pulse: (!) 49 (!) 53 (!) 57 (!) 59  Resp: 15 16 16 18   Temp:      TempSrc:  Oral    SpO2: 100% 100%    Weight:      Height:       Weight change:   Intake/Output Summary (Last 24 hours) at 08/06/2017 0902 Last data filed at 08/06/2017 0545 Gross per 24 hour  Intake 860 ml  Output 1550 ml  Net -690 ml    Assessment/ Plan: Pt is a 67 y.o. yo male DM, HTN, CAD, and baseline CKD 4 who was admitted on 07/30/2017 with [facility] on CRF req emergent HD on 5/9 and has cont to require HD- making plans for new start to chronic HD  Assessment/Plan: 1. Renal- s/p HD times 4- been doing on MWF schedule, #5 today- making urine but numbers worsening in between HD treatments-  has temp fem HD cath placed 5/9.  Plan to place [facility] on Friday- so will remove fem cath after HD today.  The plan for perm access depends on cards workup.  Low K on higher K bath.  CLIP in process -  2. HTN/vol-  EF 30% cards work up in process- cath 5/14 with intervention.  Volume status seems OK, making urine on no lasix - on hydralazine- since commited to HD will change to long acting ARB- will add losartan and stop hydralazine 3. Anemia- considering hemachromatosis- per primary/GI- on aranesp 100 q Sat  4. Secondary hyperparathyroidism- PTH 121 no meds- phos well controlled on phoslo  5. Low platelets- trending up a lot - no heparin with HD for now- dont see where HIT has been checked  6. Dispo- no OP slot yet.... Will need to complete cards work and ideally get perm access prior to discharge    [place]: Basic Metabolic Panel: Recent Labs  Lab 07/30/17 1836  08/03/17 0221 08/04/17 0251 08/05/17 0220 08/06/17 0751  NA 133*   < > 138 135  135 133* 129*  K 4.9   < > 3.4* 3.3*  3.2*  3.6 3.4*  CL 91*   < > 98* 96*  96* 97* 94*  CO2 20*   < > 27 26  26 26 22   GLUCOSE 228*   < > 170* 184*  185* 225* 142*  BUN 146*   < > 46* 58*  56* 33* 48*  CREATININE 9.73*   < > 3.83* 4.85*  4.74* 3.19* 4.59*  CALCIUM 7.2*   < > 7.3* 6.7*  6.7* 6.7* 6.5*  PHOS 11.9*  --  3.5 4.9*  --   --    < > = values in this interval not displayed.   Liver Function Tests: Recent Labs  Lab 08/04/17 0251 08/05/17 0220 08/06/17 0751  AST 43* 45* 33  ALT 496* 355* 237*  ALKPHOS 94 96 92  BILITOT 0.4 0.5 0.4  PROT 5.1* 5.1* 5.2*  ALBUMIN 2.6*  2.6* 2.5* 2.6*   Recent Labs  Lab 08/01/17 0305  LIPASE 80*   Recent Labs  Lab 07/30/17 1838  AMMONIA 35   CBC: Recent Labs  Lab 07/30/17 1357  08/02/17 0249 08/03/17 0221 08/04/17 0251 08/05/17 0220 08/06/17 0751  WBC 14.4*   < >  13.9* 14.2* 13.7* 11.8* 12.4*  NEUTROABS 12.1*  --   --   --   --   --   --   HGB 7.6*   < > 9.2* 10.0* 9.0* 9.0* 8.6*  HCT 22.4*   < > 27.2* 29.6* 26.5* 26.2* 25.4*  MCV 88.9   < > 89.2 89.4 89.2 89.7 89.8  PLT 77*   < > 96* 112* 108* 100* 133*   < > = values in this interval not displayed.   Cardiac Enzymes: Recent Labs  Lab 07/30/17 1836 07/30/17 2334 07/31/17 0935 07/31/17 1607 07/31/17 2208  TROPONINI 14.06* 11.58* 11.75* 11.03* 11.30*   CBG: Recent Labs  Lab 08/05/17 0724 08/05/17 0853 08/05/17 1135 08/05/17 1541 08/05/17 2121  GLUCAP 140* 141* 191* 333* 269*    Iron Studies: No results for input(s): IRON, TIBC, TRANSFERRIN, FERRITIN in the last 72 hours. Studies/Results: No results found. Medications: Infusions: . sodium chloride    . sodium chloride      Scheduled Medications: . bisacodyl  10 mg Rectal q morning - 10a  . calcium acetate  2,001 mg Oral TID WC  . darbepoetin (ARANESP) injection - DIALYSIS  100 mcg Intravenous Q Sat-HD  . hydrALAZINE  10 mg Oral Q8H  . insulin aspart  0-5 Units Subcutaneous QHS  . insulin aspart  0-9 Units Subcutaneous TID WC  .  mouth rinse  15 mL Mouth Rinse BID  . sodium chloride flush  3 mL Intravenous Q12H  . tamsulosin  0.4 mg Oral QPC breakfast    have reviewed scheduled and prn medications.  Physical Exam: General: NAD Heart: RRR Lungs: mostly clear Abdomen: soft, non tender Extremities: min edema Dialysis Access: femoral vascath placed 5/9    08/06/2017,9:02 AM  LOS: 7 days

## 2017-08-06 NOTE — Procedures (Signed)
Patient was seen on dialysis and the procedure was supervised.  BFR 350  Via vascath BP is  151/66.   Patient appears to be tolerating treatment well  Joseph Mcdonald A 08/06/2017

## 2017-08-06 NOTE — Progress Notes (Addendum)
   Daily Progress Note  Cardiac cath report reviewed: Multi-vessel CAD with thrombosed stents.     Wait to see if any further PCI vs medical mgmt  Regardless, minimally will place [facility] on Friday.    In the event, cardiac intervention is planned, may need to hold on permanent access if potent anti-platelet use is planned vs. cccept significant bleeding if single stage BVT planned.    Will probably stage the BVT in this patient subsequently if permanent access pursued   Adele Barthel, MD, [facility] [place] Office: [phone] Pager: [phone]  08/06/2017, 6:48 AM

## 2017-08-06 NOTE — Progress Notes (Signed)
Progress Note  Patient Name: Joseph Mcdonald Date of Encounter: 08/06/2017  Primary Cardiologist: Dorris Carnes, MD   Subjective   No cardiac complaints getting dialysis    Inpatient Medications    Scheduled Meds: . bisacodyl  10 mg Rectal q morning - 10a  . calcium acetate  2,001 mg Oral TID WC  . darbepoetin (ARANESP) injection - DIALYSIS  100 mcg Intravenous Q Sat-HD  . hydrALAZINE  10 mg Oral Q8H  . insulin aspart  0-5 Units Subcutaneous QHS  . insulin aspart  0-9 Units Subcutaneous TID WC  . mouth rinse  15 mL Mouth Rinse BID  . sodium chloride flush  3 mL Intravenous Q12H  . tamsulosin  0.4 mg Oral QPC breakfast   Continuous Infusions: . sodium chloride    . sodium chloride     PRN Meds: sodium chloride, hydrALAZINE, ondansetron (ZOFRAN) IV, oxyCODONE-acetaminophen, polyethylene glycol, sodium chloride flush   Vital Signs    Vitals:   08/06/17 0701 08/06/17 0720 08/06/17 0731 08/06/17 0800  BP:  (!) 159/78 (!) 159/74 (!) 161/77  Pulse: (!) 49 (!) 53 (!) 57 (!) 59  Resp: 15 16 16 18   Temp:      TempSrc:  Oral    SpO2: 100% 100%    Weight:      Height:        Intake/Output Summary (Last 24 hours) at 08/06/2017 0905 Last data filed at 08/06/2017 0545 Gross per 24 hour  Intake 860 ml  Output 1550 ml  Net -690 ml   Filed Weights   08/04/17 0755 08/04/17 1200 08/05/17 0500  Weight: 160 lb 7.9 oz (72.8 kg) 156 lb 1.4 oz (70.8 kg) 158 lb 11.7 oz (72 kg)    Telemetry    NSR  - Personally Reviewed  ECG     NSR  - Personally Reviewed  Physical Exam   Physical Exam: Blood pressure (!) 161/77, pulse (!) 59, temperature (!) 97.5 F (36.4 C), temperature source Oral, resp. rate 18, height 5\' 4"  (1.626 m), weight 158 lb 11.7 oz (72 kg), SpO2 100 %.  Affect appropriate Chronically ill white male  HEENT: normal Neck supple with no adenopathy JVP normal no bruits no thyromegaly Lungs clear with no wheezing and good diaphragmatic motion Heart:   S1/S2 SEM  murmur, no rub, gallop or click PMI normal Abdomen: benighn, BS positve, no tenderness, no AAA no bruit.  No HSM or HJR Distal pulses intact with no bruits No edema Neuro non-focal Skin warm and dry No muscular weakness Dialysis catheter in RFV  Cath sight LFA A no hematoma    Labs    Chemistry Recent Labs  Lab 08/04/17 0251 08/05/17 0220 08/06/17 0751  NA 135  135 133* 129*  K 3.3*  3.2* 3.6 3.4*  CL 96*  96* 97* 94*  CO2 26  26 26 22   GLUCOSE 184*  185* 225* 142*  BUN 58*  56* 33* 48*  CREATININE 4.85*  4.74* 3.19* 4.59*  CALCIUM 6.7*  6.7* 6.7* 6.5*  PROT 5.1* 5.1* 5.2*  ALBUMIN 2.6*  2.6* 2.5* 2.6*  AST 43* 45* 33  ALT 496* 355* 237*  ALKPHOS 94 96 92  BILITOT 0.4 0.5 0.4  GFRNONAA 11*  11* 19* 12*  GFRAA 13*  13* 21* 14*  ANIONGAP 13  13 10 13      Hematology Recent Labs  Lab 08/04/17 0251 08/05/17 0220 08/06/17 0751  WBC 13.7* 11.8* 12.4*  RBC 2.97* 2.92* 2.83*  HGB 9.0* 9.0* 8.6*  HCT 26.5* 26.2* 25.4*  MCV 89.2 89.7 89.8  MCH 30.3 30.8 30.4  MCHC 34.0 34.4 33.9  RDW 13.9 14.1 13.8  PLT 108* 100* 133*    Cardiac Enzymes Recent Labs  Lab 07/30/17 2334 07/31/17 0935 07/31/17 1607 07/31/17 2208  TROPONINI 11.58* 11.75* 11.03* 11.30*   No results for input(s): TROPIPOC in the last 168 hours.   BNP Recent Labs  Lab 07/30/17 1358  BNP 4,146.3*     DDimer No results for input(s): DDIMER in the last 168 hours.   Radiology    No results found.  Cardiac Studies   Echo EF 35-40%    Patient Profile     67 y.o. male with acute renal insufficiency , hx of CAD and + troponins TTE with EF Newly down 35-40% peak troponin 11 range   Assessment & Plan    1.  Troponin elevation: His troponins remained around 11.  Cath yesterday reviewed Patent LIMA to LAD, Patent SVG to  PDA Occluded SVG to Ramus and OM1  Plan for medical Rx no stent. Add nitrates No need for beta blocker with bradycardia Ok to proceed with fistula  surgery at any time   2. Anemia:  aranasp per renal likely from CRF  3. HTN:  Continue hydralazine   4. DM:  Discussed low carb diet.  Target hemoglobin A1c is 6.5 or less.  Continue current medications.        Signed, Jenkins Rouge, MD  08/06/2017, 9:05 AM

## 2017-08-06 NOTE — Progress Notes (Addendum)
Family Medicine Teaching Service Daily Progress Note Intern Pager: [phone]  Patient name: Joseph Mcdonald Medical record number: [id number] Date of birth: 22-Apr-1950 Age: 67 y.o. Gender: male  Primary Care Provider: Mercy Riding, MD Consultants: Nephrology, Cardiology Code Status: Full  Pt Overview and Major Events to Date:  5/08 Admitted 5/09 HD #1 5/10 VVS vein mapping, CT abd/pelvis neg 5/11 HD #2, on room air 5/13 heart cath  5/15 HD w/ removal of fem cath   Assessment and Plan: Joseph Mcdonald is a 67 y.o. male presenting with dyspnea and abdominal pain. PMH is significant for CAD status post CABG 2011, CKD 4, DM-2, hypertension, GERD, chronic back pain, secondary hyperparathyroidism, anemia of renal disease, gout, bradycardia.   1.   New onset HFrEF: on room air.  Cath with significant stenosis 70-100% in multiple vessels per cath.   Also with ESRD awaiting long term HD access.   -Nephrology consulted, appreciate recommendations, s/p HD 5/9, 5/11, 5/13, next likely 5/15  -Cardiology consulted, appreciate recommendations- suggested medical management of extensive stenosis from cath -Daily weights, I&O's -Telemetry -waiting on cards recs for medical management suggestions  2.  Elevated transaminases: Pain resolved, Patient continues to have bowel movements.  Hepatic panel negative.  GI consulted considering hemochromatosis work-up given negative CT abdomen and pelvis and right upper quadrant ultrasound.  Transaminases improving. - GI consulted, appreciate recommendations- after cath, could consider MRI w/o contrast vs jugular liver biopsy. -GI will remain available for phone call -CMP pending 5/15am  3.  Anemia of chronic inflammation  Thrombocytopenia  Iron overload: stable @ Hemoglobin 8.6  and platelets improving slightly to 133.  Has femoral cath for HD access.  Does have signs of iron overload.  Possibly hemochromatosis - GI following. - Continue  monitoring CBC  4.  Coronary arterial disease: History of CABG in 2011.  No signs of ACS on exam.  Troponins plateaued to 11.  Cardiology following - Heart catheterization per cardiology 5/14  5.  Acute on chronic CKD 4  Hyperkalemia  Anion gap metabolic acidosis: Hyperkalemia and metabolic acidosis resolved following HD.  Has progressed to ESRD.  Nephrology currently working on obtaining permanent access. - Plan per problem #1  6.  Diabetes mellitus type 2: Chronic.  A1c 7.2.  Takes Lantus and Victoza at home.  Needed <10 novolog overnight. -Sensitive sliding scale with meals, adding bedtime coverage -hold off on starting lantus given low novolog use in prior day  7.  Hypertension: Chronic.  Managing primarily via HD. - Continue hydralazine as needed and continue 10 mg TID  8.  Urinary retention: Likely related to BPH indicated on CT abdomen and pelvis. - Initiating Flomax - Foley cath in and out as needed following bladder scans  FEN/GI: Renal diet PPx: SCDs   Disposition:  Plan to continue HD and place HD cath before Friday's HD.   Fem cath likely removed today after HD.   Further liver imaging and potential HD fistula procedure inpatient pending cardiac recs  Subjective:  Feeling ok after cath, still tired.  Concerned about results and looking forward to a detailed talk with cardiology.  Objective: Temp:  [97.1 F (36.2 C)-98.6 F (37 C)] 97.5 F (36.4 C) (05/15 0324) Pulse Rate:  [30-75] 48 (05/15 0324) Resp:  [15-23] 18 (05/15 0324) BP: (132-176)/(47-82) 166/77 (05/15 0324) SpO2:  [97 %-100 %] 100 % (05/15 0324) Weight:  [158 lb 11.7 oz (72 kg)] 158 lb 11.7 oz (72 kg) (05/14 0500) Physical Exam:  General: NAD, pleasant Eyes: EOMI, no conjunctival pallor or injection Cardiovascular: RRR, no m/r/g, no LE edema, fam cath in place Respiratory: CTA BL, normal work of breathing Gastrointestinal: soft, nontender, nondistended,  MSK: moves 4 extremities equally Derm: no  rashes appreciated Neuro: CN II-XII grossly intact Psych: AOx3, appropriate affect   Laboratory: Recent Labs  Lab 08/03/17 0221 08/04/17 0251 08/05/17 0220  WBC 14.2* 13.7* 11.8*  HGB 10.0* 9.0* 9.0*  HCT 29.6* 26.5* 26.2*  PLT 112* 108* 100*   Recent Labs  Lab 08/02/17 0249 08/03/17 0221 08/04/17 0251 08/05/17 0220  NA 136 138 135  135 133*  K 3.5 3.4* 3.3*  3.2* 3.6  CL 97* 98* 96*  96* 97*  CO2 25 27 26  26 26   BUN 75* 46* 58*  56* 33*  CREATININE 5.33* 3.83* 4.85*  4.74* 3.19*  CALCIUM 7.1* 7.3* 6.7*  6.7* 6.7*  PROT 5.7*  --  5.1* 5.1*  BILITOT 0.6  --  0.4 0.5  ALKPHOS 124  --  94 96  ALT 1,237*  --  496* 355*  AST 146*  --  43* 45*  GLUCOSE 179* 170* 184*  185* 225*   5/11 ANA neg 5/11 Ceruloplasmin 29.7 (wnl) 5/11 PTH: 121 5/11 IgG 585, IgA 264, IgM 48 5/10 GGT: 256 5/10 Lipase: 80 < 89 5/10 FOBT: Positive 5/09 Troponin I: 11.3 <11.0 <11.75 <11.58 5/09 HBV panel: Negative 5/09 Ferritin: >7,500 5/08 UA: Moderate hemoglobin, 150 glucose, >300 protein 5/08 Blood cultures x2: NGTD 5/08 Ethanol: Negative 5/08 Acetaminophen level: Negative 5/08 Ammonia: 35 5/08 Reticulocytes: 2.44 5/08 Iron studies: Iron 230, TIBC 238, saturation 97% 5/08 Folate: 11.7 5/08 B12: 4551 5/08 Hepatitis panel: Negative 5/08 INR: 1.71 5/08 BNP: 4146 (1057, 1 year ago)  Imaging/Diagnostic Tests: CT ABDOMEN PELVIS W CONTRAST (08/01/2017) 1. Small right pleural effusion and minimal left pleural effusion. 2. Mild bilateral lower lobe dependent atelectasis. 3. Cardiomegaly. 4. Mild diffuse hepatic steatosis. 5. Small amount of air in the urinary bladder, most likely due to recent catheterization. 6.  Calcific coronary artery and aortic atherosclerosis. 7. Mild prostatic hypertrophy.  ABDOMEN ULTRASOUND COMPLETE (07/30/2017) Unremarkable ultrasound of the abdomen. No acute intra-abdominal findings. Uncomplicated appearing renal cystic disease without hydronephrosis or  solid mass. Hepatic steatosis.  No gallstones or biliary ductal dilatation.  CHEST - 2 (07/30/2017) Cardiac enlargement and low bilateral lung volumes.   Sherene Sires, DO 08/06/2017, 4:59 AM PGY-1, [place] Intern pager: [phone], text pages welcome

## 2017-08-07 LAB — GLUCOSE, CAPILLARY
GLUCOSE-CAPILLARY: 144 mg/dL — AB (ref 65–99)
GLUCOSE-CAPILLARY: 290 mg/dL — AB (ref 65–99)
Glucose-Capillary: 324 mg/dL — ABNORMAL HIGH (ref 65–99)
Glucose-Capillary: 219 mg/dL — ABNORMAL HIGH (ref 65–99)
Glucose-Capillary: 252 mg/dL — ABNORMAL HIGH (ref 65–99)

## 2017-08-07 LAB — CBC
HCT: 25.6 % — ABNORMAL LOW (ref 39.0–52.0)
HCT: 25.3 % — ABNORMAL LOW (ref 39.0–52.0)
HEMOGLOBIN: 8.7 g/dL — AB (ref 13.0–17.0)
Hemoglobin: 8.6 g/dL — ABNORMAL LOW (ref 13.0–17.0)
MCH: 30.9 pg (ref 26.0–34.0)
MCH: 30.9 pg (ref 26.0–34.0)
MCHC: 34 g/dL (ref 30.0–36.0)
MCHC: 34 g/dL (ref 30.0–36.0)
MCV: 90.8 fL (ref 78.0–100.0)
MCV: 91 fL (ref 78.0–100.0)
PLATELETS: 164 10*3/uL (ref 150–400)
Platelets: 141 10*3/uL — ABNORMAL LOW (ref 150–400)
RBC: 2.82 MIL/uL — ABNORMAL LOW (ref 4.22–5.81)
RBC: 2.78 MIL/uL — AB (ref 4.22–5.81)
RDW: 13.8 % (ref 11.5–15.5)
RDW: 13.7 % (ref 11.5–15.5)
WBC: 12.7 10*3/uL — ABNORMAL HIGH (ref 4.0–10.5)
WBC: 13 10*3/uL — ABNORMAL HIGH (ref 4.0–10.5)

## 2017-08-07 LAB — HEMOCHROMATOSIS DNA-PCR(C282Y,H63D)

## 2017-08-07 MED ORDER — DARBEPOETIN ALFA 200 MCG/0.4ML IJ SOSY
200.0000 ug | PREFILLED_SYRINGE | INTRAMUSCULAR | Status: DC
  Administered 2017-08-09: 200 ug via INTRAVENOUS
  Filled 2017-08-07: qty 0.4

## 2017-08-07 MED ORDER — VANCOMYCIN HCL IN DEXTROSE 1-5 GM/200ML-% IV SOLN
1000.0000 mg | INTRAVENOUS | Status: AC
  Administered 2017-08-08: 1000 mg via INTRAVENOUS
  Filled 2017-08-07 (×2): qty 200

## 2017-08-07 NOTE — Progress Notes (Signed)
Genetic test for hemachromatosis still pending.  LFT's better yesterday--will check tomorrow.  The declining trend in LFT's is encouraging, and goes somewhat against significant chronic liver disease such as hemachromatosis, but we will need to wait and see.  An abd MRI without IV contrast prior to dischg would confirm the presence or absence of pathologic iron deposition in abd organs, but it may not be necessary if LFT's continue to decline.  Cleotis Nipper, M.D. Pager [phone] If no answer or after 5 PM call [phone]

## 2017-08-07 NOTE — Progress Notes (Signed)
Subjective:  No new c/o's- found has spot in [place] [facility]- vascath removed after HD yest- removed 1000- BP stayed high Objective Vital signs in last 24 hours: Vitals:   08/06/17 2326 08/07/17 0500 08/07/17 0609 08/07/17 0739  BP: (!) 154/68 (!) 162/67  (!) 143/62  Pulse: (!) 49 (!) 46  (!) 47  Resp: 17 16  18   Temp: 98.9 F (37.2 C) 98.4 F (36.9 C)  98.3 F (36.8 C)  TempSrc: Oral Oral  Oral  SpO2: 98% 100%  99%  Weight:   71 kg (156 lb 8.4 oz)   Height:       Weight change:   Intake/Output Summary (Last 24 hours) at 08/07/2017 0741 Last data filed at 08/06/2017 2229 Gross per 24 hour  Intake 240 ml  Output 1450 ml  Net -1210 ml    Assessment/ Plan: Pt is a 67 y.o. yo male DM, HTN, CAD, and baseline CKD 4 who was admitted on 07/30/2017 with A on CRF req emergent HD on 5/9 and has cont to require HD- making plans for new start to chronic HD  Assessment/Plan: 1. Renal- s/p HD times 5- have been doing on MWF schedule, - making some urine but numbers worsening in between HD treatments-  had temp fem HD cath placed 5/9- removed 5/15.  Plan for VVS to place [facility] and access on Friday.   Low K on higher K bath.  Has spot TTS in [place] - will plan on next HD Saturday here at [facility] 2. HTN/vol-  EF 30% cards work up in process- cath 5/14 with disease.  Medical management planned. Volume status seems OK, making urine on no lasix - on hydralazine- since commited to HD have change to long acting ARB- - BP still a little high, may need increase, will watch 3. Anemia- considering hemachromatosis- per primary/GI- on aranesp 100 q Sat - will inc to 200 this Sat 4. Secondary hyperparathyroidism- PTH 121 no meds- phos well controlled on phoslo  5. Low platelets- trending up a lot - no heparin with HD for now- dont see where HIT has been checked - will try some tight heparin with HD 6. Dispo- for access Friday, will do HD here on Sat then if no other issues could be discharged    [place]: Basic Metabolic Panel: Recent Labs  Lab 08/03/17 0221 08/04/17 0251 08/05/17 0220 08/06/17 0751  NA 138 135  135 133* 129*  K 3.4* 3.3*  3.2* 3.6 3.4*  CL 98* 96*  96* 97* 94*  CO2 27 26  26 26 22   GLUCOSE 170* 184*  185* 225* 142*  BUN 46* 58*  56* 33* 48*  CREATININE 3.83* 4.85*  4.74* 3.19* 4.59*  CALCIUM 7.3* 6.7*  6.7* 6.7* 6.5*  PHOS 3.5 4.9*  --   --    Liver Function Tests: Recent Labs  Lab 08/04/17 0251 08/05/17 0220 08/06/17 0751  AST 43* 45* 33  ALT 496* 355* 237*  ALKPHOS 94 96 92  BILITOT 0.4 0.5 0.4  PROT 5.1* 5.1* 5.2*  ALBUMIN 2.6*  2.6* 2.5* 2.6*   Recent Labs  Lab 08/01/17 0305  LIPASE 80*   No results for input(s): AMMONIA in the last 168 hours. CBC: Recent Labs  Lab 08/03/17 0221 08/04/17 0251 08/05/17 0220 08/06/17 0751 08/07/17 0222  WBC 14.2* 13.7* 11.8* 12.4* 12.7*  HGB 10.0* 9.0* 9.0* 8.6* 8.7*  HCT 29.6* 26.5* 26.2* 25.4* 25.6*  MCV 89.4 89.2 89.7  89.8 90.8  PLT 112* 108* 100* 133* 141*   Cardiac Enzymes: Recent Labs  Lab 07/31/17 0935 07/31/17 1607 07/31/17 2208  TROPONINI 11.75* 11.03* 11.30*   CBG: Recent Labs  Lab 08/05/17 2121 08/06/17 1227 08/06/17 1603 08/06/17 2142 08/07/17 0737  GLUCAP 269* 109* 224* 341* 144*    Iron Studies: No results for input(s): IRON, TIBC, TRANSFERRIN, FERRITIN in the last 72 hours. Studies/Results: No results found. Medications: Infusions: . sodium chloride    . sodium chloride      Scheduled Medications: . calcium acetate  2,001 mg Oral TID WC  . darbepoetin (ARANESP) injection - DIALYSIS  100 mcg Intravenous Q Sat-HD  . insulin aspart  0-5 Units Subcutaneous QHS  . insulin aspart  0-9 Units Subcutaneous TID WC  . isosorbide mononitrate  15 mg Oral Daily  . losartan  50 mg Oral QHS  . mouth rinse  15 mL Mouth Rinse BID  . sodium chloride flush  3 mL Intravenous Q12H  . tamsulosin  0.4 mg Oral QPC breakfast    have reviewed scheduled and prn  medications.  Physical Exam: General: NAD "starving to death"  Heart: RRR Lungs: mostly clear Abdomen: soft, non tender Extremities: min edema Dialysis Access: femoral vascath removed   08/07/2017,7:41 AM  LOS: 8 days

## 2017-08-07 NOTE — Progress Notes (Signed)
Family Medicine Teaching Service Daily Progress Note Intern Pager: [phone]  Patient name: Joseph Mcdonald Medical record number: [id number] Date of birth: 02-10-1951 Age: 67 y.o. Gender: male  Primary Care Provider: Mercy Riding, MD Consultants: Nephrology, Cardiology Code Status: Full  Pt Overview and Major Events to Date:  5/08 Admitted 5/09 HD #1 5/10 VVS vein mapping, CT abd/pelvis neg 5/11 HD #2, on room air 5/13 heart cath  5/15 HD w/ removal of fem cath   Assessment and Plan: Joseph Mcdonald is a 67 y.o. male presenting with dyspnea and abdominal pain. PMH is significant for CAD status post CABG 2011, CKD 4, DM-2, hypertension, GERD, chronic back pain, secondary hyperparathyroidism, anemia of renal disease, gout, bradycardia.     New onset HFrEF: on room air.  2nd deg block on tele,Cath with significant stenosis 70-100% in multiple vessels per cath.   Also with ESRD awaiting long term HD access.   -Nephrology consulted, appreciate recommendations, s/p HD 5/9, 5/11, 5/13, 5/15, permcath pending for 5/17 HD  -Cardiology consulted, appreciate recommendations- suggested medical management of extensive stenosis from cath, imdur currently -Daily weights, I&O's -Telemetry -transfer down to med-surg, d/c continuous pulse ox -AM ecg ordered    Elevated transaminases: AST resolved, ALT steadily improving.  Pain resolved, Patient continues to have bowel movements.  Hepatic panel negative.  GI consulted considering hemochromatosis work-up given negative CT abdomen and pelvis and right upper quadrant ultrasound.  - GI consulted, appreciate recommendations- after cath, could consider MRI w/o contrast vs jugular liver biopsy. -GI will remain available for phone call   Anemia of chronic inflammation  Thrombocytopenia  Iron overload: stable @ Hemoglobin 8.7  and platelets improving slightly to 141.  Does have signs of iron overload.  Possibly hemochromatosis - GI  following. - Continue monitoring CBC  .  Acute on chronic CKD 4  Hyperkalemia  Anion gap metabolic acidosis: Hyperkalemia and metabolic acidosis resolved following HD.  Has progressed to ESRD.  Nephrology currently working on obtaining permanent access. - managed with HD via nephro -permath acces pending nfor 5/17 HD -vein mapped and BVT potentially next week -HD outpatient arranged    Diabetes mellitus type 2: chronic.  A1c 7.2.  Takes Lantus and Victoza at home.  Needed <10 novolog overnight. -Sensitive sliding scale with meals, adding bedtime coverage -hold off on starting lantus given low novolog use in prior day    Hypertension: Chronic.  Managing primarily via HD.  - Continue hydralazine as needed and continue 10 mg TID   Urinary retention: Likely related to BPH indicated on CT abdomen and pelvis. - Initiating Flomax - Foley cath in and out as needed following bladder scans  FEN/GI: Renal diet PPx: SCDs   Disposition:  Plan to continue HD and place HD cath before Friday's HD, now has outpatient HD arranged.   Further liver imaging and potential HD fistula procedure inpatient pending cardiac recs  Subjective:  Feels well, fem cath removed.  Would like to know if fistula BVT happens during this admission or outpatient  Objective: Temp:  [98.1 F (36.7 C)-98.9 F (37.2 C)] 98.4 F (36.9 C) (05/16 0500) Pulse Rate:  [46-61] 46 (05/16 0500) Resp:  [13-18] 16 (05/16 0500) BP: (131-171)/(58-79) 162/67 (05/16 0500) SpO2:  [98 %-100 %] 100 % (05/16 0500) Weight:  [154 lb 15.7 oz (70.3 kg)-158 lb 4.6 oz (71.8 kg)] 154 lb 15.7 oz (70.3 kg) (05/15 1137) Physical Exam: General: NAD, pleasant ENTM: Moist mucous membranes Cardiovascular: regular rhythm, bradycardic,  no m/r/g, no LE edema Respiratory: CTA BL, normal work of breathing Gastrointestinal: soft, nontender, nondistended, normoactive BS MSK: moves 4 extremities equally Derm: no rashes appreciated Neuro: grossly  intact Psych: AOx3, appropriate affect   Laboratory: Recent Labs  Lab 08/05/17 0220 08/06/17 0751 08/07/17 0222  WBC 11.8* 12.4* 12.7*  HGB 9.0* 8.6* 8.7*  HCT 26.2* 25.4* 25.6*  PLT 100* 133* 141*   Recent Labs  Lab 08/04/17 0251 08/05/17 0220 08/06/17 0751  NA 135  135 133* 129*  K 3.3*  3.2* 3.6 3.4*  CL 96*  96* 97* 94*  CO2 26  26 26 22   BUN 58*  56* 33* 48*  CREATININE 4.85*  4.74* 3.19* 4.59*  CALCIUM 6.7*  6.7* 6.7* 6.5*  PROT 5.1* 5.1* 5.2*  BILITOT 0.4 0.5 0.4  ALKPHOS 94 96 92  ALT 496* 355* 237*  AST 43* 45* 33  GLUCOSE 184*  185* 225* 142*   5/11 ANA neg 5/11 Ceruloplasmin 29.7 (wnl) 5/11 PTH: 121 5/11 IgG 585, IgA 264, IgM 48 5/10 GGT: 256 5/10 Lipase: 80 < 89 5/10 FOBT: Positive 5/09 Troponin I: 11.3 <11.0 <11.75 <11.58 5/09 HBV panel: Negative 5/09 Ferritin: >7,500 5/08 UA: Moderate hemoglobin, 150 glucose, >300 protein 5/08 Blood cultures x2: NGTD 5/08 Ethanol: Negative 5/08 Acetaminophen level: Negative 5/08 Ammonia: 35 5/08 Reticulocytes: 2.44 5/08 Iron studies: Iron 230, TIBC 238, saturation 97% 5/08 Folate: 11.7 5/08 B12: 4551 5/08 Hepatitis panel: Negative 5/08 INR: 1.71 5/08 BNP: 4146 (1057, 1 year ago)  Imaging/Diagnostic Tests: CT ABDOMEN PELVIS W CONTRAST (08/01/2017) 1. Small right pleural effusion and minimal left pleural effusion. 2. Mild bilateral lower lobe dependent atelectasis. 3. Cardiomegaly. 4. Mild diffuse hepatic steatosis. 5. Small amount of air in the urinary bladder, most likely due to recent catheterization. 6.  Calcific coronary artery and aortic atherosclerosis. 7. Mild prostatic hypertrophy.  ABDOMEN ULTRASOUND COMPLETE (07/30/2017) Unremarkable ultrasound of the abdomen. No acute intra-abdominal findings. Uncomplicated appearing renal cystic disease without hydronephrosis or solid mass. Hepatic steatosis.  No gallstones or biliary ductal dilatation.  CHEST - 2 (07/30/2017) Cardiac enlargement and  low bilateral lung volumes.   Sherene Sires, DO 08/07/2017, 5:50 AM PGY-1, [place] Intern pager: [phone], text pages welcome

## 2017-08-07 NOTE — Progress Notes (Addendum)
   Preoperative Note   Procedure: L BVT, TDC placement  Date: 08/08/17  Preoperative diagnosis: ESRD  Consent: will come by for informed consent today  Laboratory:  CBC:    Component Value Date/Time   WBC 12.7 (H) 08/07/2017 0222   RBC 2.82 (L) 08/07/2017 0222   HGB 8.7 (L) 08/07/2017 0222   HCT 25.6 (L) 08/07/2017 0222   PLT 141 (L) 08/07/2017 0222   MCV 90.8 08/07/2017 0222   MCH 30.9 08/07/2017 0222   MCHC 34.0 08/07/2017 0222   RDW 13.8 08/07/2017 0222   LYMPHSABS 0.9 07/30/2017 1357   MONOABS 1.4 (H) 07/30/2017 1357   EOSABS 0.0 07/30/2017 1357   BASOSABS 0.0 07/30/2017 1357    BMP:    Component Value Date/Time   NA 129 (L) 08/06/2017 0751   NA 139 03/12/2017 1227   K 3.4 (L) 08/06/2017 0751   CL 94 (L) 08/06/2017 0751   CO2 22 08/06/2017 0751   GLUCOSE 142 (H) 08/06/2017 0751   BUN 48 (H) 08/06/2017 0751   BUN 62 (H) 03/12/2017 1227   CREATININE 4.59 (H) 08/06/2017 0751   CREATININE 2.81 (H) 02/19/2016 1459   CALCIUM 6.5 (L) 08/06/2017 0751   GFRNONAA 12 (L) 08/06/2017 0751   GFRNONAA 22 (L) 02/19/2016 1459   GFRAA 14 (L) 08/06/2017 0751   GFRAA 26 (L) 02/19/2016 1459    Coagulation: Lab Results  Component Value Date   INR 1.09 08/04/2017   INR 1.71 07/30/2017   INR 1.17 12/28/2015   No results found for: PTT  Cardiology: see Dr. Kyla Balzarine notes  CXR:  07/30/17 Cardiac enlargement and low bilateral lung volumes.   Adele Barthel, MD, FACS [facility] Office: [phone] Pager: [phone]  08/07/2017, 7:13 AM   Addendum   The patient is aware the risks of tunneled dialysis catheter placement include but are not limited to: bleeding, infection, central venous injury, pneumothorax, possible venous stenosis, possible malpositioning in the venous system, and possible infections related to long-term catheter presence.  Risk, benefits, and alternatives to access surgery were discussed.   The patient is aware the  risks include but are not limited to: bleeding, infection, steal syndrome, nerve damage, ischemic monomelic neuropathy, thrombosis, failure to mature, complications related to venous hypertension, need for additional procedures, death and stroke.    The patient was aware of these risks and agreed to proceed.  Adele Barthel, MD, FACS [facility] [place] Office: [phone] Pager: [phone]  08/07/2017, 11:57 AM

## 2017-08-08 ENCOUNTER — Inpatient Hospital Stay (HOSPITAL_COMMUNITY): Payer: Medicare Other | Admitting: Anesthesiology

## 2017-08-08 ENCOUNTER — Inpatient Hospital Stay (HOSPITAL_COMMUNITY): Payer: Medicare Other

## 2017-08-08 ENCOUNTER — Encounter (HOSPITAL_COMMUNITY)
Admission: EM | Disposition: A | Discharge status: Home Health | Payer: Self-pay | Source: Home / Self Care | Attending: Family Medicine

## 2017-08-08 ENCOUNTER — Telehealth: Payer: Self-pay | Admitting: Student

## 2017-08-08 ENCOUNTER — Encounter (HOSPITAL_COMMUNITY): Payer: Self-pay | Admitting: Anesthesiology

## 2017-08-08 DIAGNOSIS — N185 Chronic kidney disease, stage 5: Secondary | ICD-10-CM | POA: Diagnosis not present

## 2017-08-08 HISTORY — PX: INSERTION OF DIALYSIS CATHETER: SHX1324

## 2017-08-08 HISTORY — PX: BASCILIC VEIN TRANSPOSITION: SHX5742

## 2017-08-08 LAB — BASIC METABOLIC PANEL
Anion gap: 10 (ref 5–15)
BUN: 49 mg/dL — ABNORMAL HIGH (ref 6–20)
CHLORIDE: 99 mmol/L — AB (ref 101–111)
CO2: 26 mmol/L (ref 22–32)
CREATININE: 4.32 mg/dL — AB (ref 0.61–1.24)
Calcium: 7.5 mg/dL — ABNORMAL LOW (ref 8.9–10.3)
GFR calc non Af Amer: 13 mL/min — ABNORMAL LOW (ref >60)
GFR, EST AFRICAN AMERICAN: 15 mL/min — AB (ref >60)
Glucose, Bld: 177 mg/dL — ABNORMAL HIGH (ref 65–99)
Potassium: 4 mmol/L (ref 3.5–5.1)
SODIUM: 135 mmol/L (ref 135–145)

## 2017-08-08 LAB — GLUCOSE, CAPILLARY
GLUCOSE-CAPILLARY: 252 mg/dL — AB (ref 65–99)
GLUCOSE-CAPILLARY: 416 mg/dL — AB (ref 65–99)
GLUCOSE-CAPILLARY: 449 mg/dL — AB (ref 65–99)
Glucose-Capillary: 161 mg/dL — ABNORMAL HIGH (ref 65–99)
Glucose-Capillary: 102 mg/dL — ABNORMAL HIGH (ref 65–99)
Glucose-Capillary: 135 mg/dL — ABNORMAL HIGH (ref 65–99)
Glucose-Capillary: 544 mg/dL (ref 65–99)

## 2017-08-08 LAB — CBC
HCT: 25.2 % — ABNORMAL LOW (ref 39.0–52.0)
Hemoglobin: 8.5 g/dL — ABNORMAL LOW (ref 13.0–17.0)
MCH: 30.6 pg (ref 26.0–34.0)
MCHC: 33.7 g/dL (ref 30.0–36.0)
MCV: 90.6 fL (ref 78.0–100.0)
PLATELETS: 186 10*3/uL (ref 150–400)
RBC: 2.78 MIL/uL — AB (ref 4.22–5.81)
RDW: 13.6 % (ref 11.5–15.5)
WBC: 11.1 10*3/uL — AB (ref 4.0–10.5)

## 2017-08-08 LAB — HEPATIC FUNCTION PANEL
ALT: 144 U/L — AB (ref 17–63)
AST: 33 U/L (ref 15–41)
Albumin: 2.7 g/dL — ABNORMAL LOW (ref 3.5–5.0)
Alkaline Phosphatase: 88 U/L (ref 38–126)
TOTAL PROTEIN: 5.3 g/dL — AB (ref 6.5–8.1)
Total Bilirubin: 0.4 mg/dL (ref 0.3–1.2)

## 2017-08-08 LAB — PROTIME-INR
INR: 1.01
Prothrombin Time: 13.2 seconds (ref 11.4–15.2)

## 2017-08-08 SURGERY — INSERTION OF DIALYSIS CATHETER
Anesthesia: Monitor Anesthesia Care

## 2017-08-08 MED ORDER — LIDOCAINE HCL (PF) 1 % IJ SOLN
INTRAMUSCULAR | Status: DC | PRN
  Administered 2017-08-08: 10 mL

## 2017-08-08 MED ORDER — PROPOFOL 10 MG/ML IV BOLUS
INTRAVENOUS | Status: DC | PRN
  Administered 2017-08-08: 30 mg via INTRAVENOUS

## 2017-08-08 MED ORDER — HEPARIN SODIUM (PORCINE) 1000 UNIT/ML IJ SOLN
INTRAMUSCULAR | Status: DC | PRN
  Administered 2017-08-08: 1000 [IU]

## 2017-08-08 MED ORDER — PROPOFOL 10 MG/ML IV BOLUS
INTRAVENOUS | Status: AC
  Filled 2017-08-08: qty 20

## 2017-08-08 MED ORDER — ONDANSETRON HCL 4 MG/2ML IJ SOLN
INTRAMUSCULAR | Status: AC
  Filled 2017-08-08: qty 2

## 2017-08-08 MED ORDER — INSULIN ASPART 100 UNIT/ML ~~LOC~~ SOLN
0.0000 [IU] | Freq: Every day | SUBCUTANEOUS | Status: DC
  Administered 2017-08-08: 5 [IU] via SUBCUTANEOUS

## 2017-08-08 MED ORDER — INSULIN ASPART 100 UNIT/ML ~~LOC~~ SOLN
35.0000 [IU] | Freq: Once | SUBCUTANEOUS | Status: AC
  Administered 2017-08-09: 35 [IU] via SUBCUTANEOUS

## 2017-08-08 MED ORDER — FENTANYL CITRATE (PF) 250 MCG/5ML IJ SOLN
INTRAMUSCULAR | Status: AC
  Filled 2017-08-08: qty 5

## 2017-08-08 MED ORDER — INSULIN ASPART 100 UNIT/ML ~~LOC~~ SOLN
0.0000 [IU] | Freq: Three times a day (TID) | SUBCUTANEOUS | Status: DC
  Administered 2017-08-09: 4 [IU] via SUBCUTANEOUS
  Administered 2017-08-09: 7 [IU] via SUBCUTANEOUS
  Administered 2017-08-09: 3 [IU] via SUBCUTANEOUS

## 2017-08-08 MED ORDER — FENTANYL CITRATE (PF) 100 MCG/2ML IJ SOLN
INTRAMUSCULAR | Status: DC | PRN
  Administered 2017-08-08: 25 ug via INTRAVENOUS

## 2017-08-08 MED ORDER — HEPARIN SODIUM (PORCINE) 1000 UNIT/ML IJ SOLN
INTRAMUSCULAR | Status: AC
  Filled 2017-08-08: qty 1

## 2017-08-08 MED ORDER — PHENYLEPHRINE HCL 10 MG/ML IJ SOLN
INTRAVENOUS | Status: DC | PRN
  Administered 2017-08-08: 20 ug/min via INTRAVENOUS

## 2017-08-08 MED ORDER — DEXAMETHASONE SODIUM PHOSPHATE 10 MG/ML IJ SOLN
INTRAMUSCULAR | Status: AC
  Filled 2017-08-08: qty 1

## 2017-08-08 MED ORDER — GLYCOPYRROLATE 0.2 MG/ML IJ SOLN
INTRAMUSCULAR | Status: DC | PRN
  Administered 2017-08-08: 0.2 mg via INTRAVENOUS

## 2017-08-08 MED ORDER — MIDAZOLAM HCL 5 MG/5ML IJ SOLN
INTRAMUSCULAR | Status: DC | PRN
  Administered 2017-08-08: 2 mg via INTRAVENOUS

## 2017-08-08 MED ORDER — SODIUM CHLORIDE 0.9 % IV SOLN
INTRAVENOUS | Status: AC
  Filled 2017-08-08: qty 1.2

## 2017-08-08 MED ORDER — PROPOFOL 500 MG/50ML IV EMUL
INTRAVENOUS | Status: DC | PRN
  Administered 2017-08-08: 100 ug/kg/min via INTRAVENOUS

## 2017-08-08 MED ORDER — 0.9 % SODIUM CHLORIDE (POUR BTL) OPTIME
TOPICAL | Status: DC | PRN
  Administered 2017-08-08: 1000 mL

## 2017-08-08 MED ORDER — EPHEDRINE SULFATE 50 MG/ML IJ SOLN
INTRAMUSCULAR | Status: DC | PRN
  Administered 2017-08-08: 10 mg via INTRAVENOUS

## 2017-08-08 MED ORDER — SODIUM CHLORIDE 0.9 % IV SOLN
INTRAVENOUS | Status: DC | PRN
  Administered 2017-08-08: 12:00:00

## 2017-08-08 MED ORDER — MIDAZOLAM HCL 2 MG/2ML IJ SOLN
INTRAMUSCULAR | Status: AC
  Filled 2017-08-08: qty 2

## 2017-08-08 MED ORDER — DEXAMETHASONE SODIUM PHOSPHATE 10 MG/ML IJ SOLN
INTRAMUSCULAR | Status: DC | PRN
  Administered 2017-08-08: 10 mg via INTRAVENOUS

## 2017-08-08 MED ORDER — INSULIN ASPART 100 UNIT/ML ~~LOC~~ SOLN
20.0000 [IU] | Freq: Once | SUBCUTANEOUS | Status: AC
  Administered 2017-08-08: 20 [IU] via SUBCUTANEOUS

## 2017-08-08 MED ORDER — LIDOCAINE 2% (20 MG/ML) 5 ML SYRINGE
INTRAMUSCULAR | Status: AC
  Filled 2017-08-08: qty 5

## 2017-08-08 SURGICAL SUPPLY — 63 items
ARMBAND PINK RESTRICT EXTREMIT (MISCELLANEOUS) ×4 IMPLANT
BAG DECANTER FOR FLEXI CONT (MISCELLANEOUS) ×4 IMPLANT
BIOPATCH BLUE 3/4IN DISK W/1.5 (GAUZE/BANDAGES/DRESSINGS) ×2 IMPLANT
BIOPATCH RED 1 DISK 7.0 (GAUZE/BANDAGES/DRESSINGS) ×3 IMPLANT
BIOPATCH RED 1IN DISK 7.0MM (GAUZE/BANDAGES/DRESSINGS) ×1
CANISTER SUCT 3000ML PPV (MISCELLANEOUS) ×4 IMPLANT
CATH EMB 2FR 60CM (CATHETERS) ×2 IMPLANT
CATH PALINDROME RT-P 15FX19CM (CATHETERS) IMPLANT
CATH PALINDROME RT-P 15FX23CM (CATHETERS) ×2 IMPLANT
CATH PALINDROME RT-P 15FX28CM (CATHETERS) IMPLANT
CATH PALINDROME RT-P 15FX55CM (CATHETERS) IMPLANT
CATH STRAIGHT 5FR 65CM (CATHETERS) IMPLANT
CLIP VESOCCLUDE MED 24/CT (CLIP) ×4 IMPLANT
CLIP VESOCCLUDE SM WIDE 24/CT (CLIP) ×4 IMPLANT
CORDS BIPOLAR (ELECTRODE) IMPLANT
COVER PROBE W GEL 5X96 (DRAPES) ×6 IMPLANT
COVER SURGICAL LIGHT HANDLE (MISCELLANEOUS) ×4 IMPLANT
DECANTER SPIKE VIAL GLASS SM (MISCELLANEOUS) ×4 IMPLANT
DERMABOND ADVANCED (GAUZE/BANDAGES/DRESSINGS) ×2
DERMABOND ADVANCED .7 DNX12 (GAUZE/BANDAGES/DRESSINGS) ×2 IMPLANT
DRAPE C-ARM 42X72 X-RAY (DRAPES) ×4 IMPLANT
DRAPE CHEST BREAST 15X10 FENES (DRAPES) ×4 IMPLANT
DRSG COVADERM 4X6 (GAUZE/BANDAGES/DRESSINGS) ×2 IMPLANT
ELECT REM PT RETURN 9FT ADLT (ELECTROSURGICAL) ×4
ELECTRODE REM PT RTRN 9FT ADLT (ELECTROSURGICAL) ×2 IMPLANT
GAUZE SPONGE 4X4 16PLY XRAY LF (GAUZE/BANDAGES/DRESSINGS) ×4 IMPLANT
GLOVE BIO SURGEON STRL SZ7 (GLOVE) ×4 IMPLANT
GLOVE BIOGEL PI IND STRL 7.5 (GLOVE) ×2 IMPLANT
GLOVE BIOGEL PI INDICATOR 7.5 (GLOVE) ×2
GOWN STRL REUS W/ TWL LRG LVL3 (GOWN DISPOSABLE) ×6 IMPLANT
GOWN STRL REUS W/TWL LRG LVL3 (GOWN DISPOSABLE) ×6
HEMOSTAT SPONGE AVITENE ULTRA (HEMOSTASIS) IMPLANT
KIT BASIN OR (CUSTOM PROCEDURE TRAY) ×4 IMPLANT
KIT TURNOVER KIT B (KITS) ×4 IMPLANT
NDL 18GX1X1/2 (RX/OR ONLY) (NEEDLE) ×2 IMPLANT
NDL HYPO 25GX1X1/2 BEV (NEEDLE) ×2 IMPLANT
NEEDLE 18GX1X1/2 (RX/OR ONLY) (NEEDLE) ×4 IMPLANT
NEEDLE HYPO 25GX1X1/2 BEV (NEEDLE) ×4 IMPLANT
NS IRRIG 1000ML POUR BTL (IV SOLUTION) ×4 IMPLANT
PACK CV ACCESS (CUSTOM PROCEDURE TRAY) ×4 IMPLANT
PACK SURGICAL SETUP 50X90 (CUSTOM PROCEDURE TRAY) ×4 IMPLANT
PAD ARMBOARD 7.5X6 YLW CONV (MISCELLANEOUS) ×8 IMPLANT
SET MICROPUNCTURE 5F STIFF (MISCELLANEOUS) IMPLANT
SOAP 2 % CHG 4 OZ (WOUND CARE) ×4 IMPLANT
SUT ETHILON 3 0 PS 1 (SUTURE) ×4 IMPLANT
SUT MNCRL AB 4-0 PS2 18 (SUTURE) ×6 IMPLANT
SUT PROLENE 6 0 BV (SUTURE) ×4 IMPLANT
SUT PROLENE 7 0 BV 1 (SUTURE) ×6 IMPLANT
SUT SILK 2 0 SH (SUTURE) IMPLANT
SUT VIC AB 2-0 CT1 27 (SUTURE) ×2
SUT VIC AB 2-0 CT1 TAPERPNT 27 (SUTURE) ×2 IMPLANT
SUT VIC AB 3-0 SH 27 (SUTURE) ×4
SUT VIC AB 3-0 SH 27X BRD (SUTURE) ×4 IMPLANT
SYR 10ML LL (SYRINGE) ×4 IMPLANT
SYR 20CC LL (SYRINGE) ×8 IMPLANT
SYR 3ML LL SCALE MARK (SYRINGE) ×4 IMPLANT
SYR 5ML LL (SYRINGE) ×4 IMPLANT
SYR CONTROL 10ML LL (SYRINGE) ×4 IMPLANT
TOWEL GREEN STERILE (TOWEL DISPOSABLE) ×4 IMPLANT
TOWEL GREEN STERILE FF (TOWEL DISPOSABLE) ×4 IMPLANT
UNDERPAD 30X30 (UNDERPADS AND DIAPERS) ×4 IMPLANT
WATER STERILE IRR 1000ML POUR (IV SOLUTION) ×4 IMPLANT
WIRE AMPLATZ SS-J .035X180CM (WIRE) IMPLANT

## 2017-08-08 NOTE — Progress Notes (Signed)
Patient NPO at 2355 for tunnelled hemodialysis catheter placement.  Patient resting comfortably on room air.  Patient complained of back pain and was medicated with one percocet that was effective.  Patient resting comfortably at this time.  Wife by his side.  Safety and comfort maintained.  Call bell within reach.

## 2017-08-08 NOTE — Progress Notes (Signed)
Subjective:  No new c/o's-  has spot in [place] [facility]- for access today - frustrated with the waiting  Objective Vital signs in last 24 hours: Vitals:   08/07/17 1629 08/08/17 0004 08/08/17 0231 08/08/17 0726  BP: (!) 154/73 (!) 164/73  (!) 168/69  Pulse: (!) 48 (!) 55  (!) 50  Resp: (!) 22 20  15   Temp: 98.7 F (37.1 C) 98.4 F (36.9 C)  98.3 F (36.8 C)  TempSrc: Oral Oral  Oral  SpO2: 98% 99%  99%  Weight:   71.1 kg (156 lb 12 oz)   Height:       Weight change: -0.7 kg (-1 lb 8.7 oz)  Intake/Output Summary (Last 24 hours) at 08/08/2017 0940 Last data filed at 08/07/2017 2039 Gross per 24 hour  Intake 363 ml  Output 275 ml  Net 88 ml    Assessment/ Plan: Pt is a 67 y.o. yo male DM, HTN, CAD, and baseline CKD 4 who was admitted on 07/30/2017 with A on CRF req emergent HD on 5/9 and has cont to require HD- making plans for new start to chronic HD  Assessment/Plan: 1. Renal- new start- have been doing on MWF schedule, - making some urine but numbers worsening in between HD treatments-  had temp fem HD cath placed 5/9- removed 5/15.  Plan for VVS to place [facility] and access today.   Low K on higher K bath.  Has spot TTS in [place] - will plan on next HD Saturday here at [facility] 2. HTN/vol-  EF 30% cards work up in process- cath 5/14 with disease.  Medical management planned. Volume status seems OK, making urine on no lasix - on hydralazine- since commited to HD have change to long acting ARB- - BP still a little high, may need increase, will watch.  If BP not lower after HD on Sat- will send out on inc dose  3. Anemia- considering hemachromatosis- per primary/GI- on aranesp 100 q Sat - will inc to 200 this Sat 4. Secondary hyperparathyroidism- PTH 121 no meds- phos well controlled on phoslo  5. Low platelets- trending up a lot - no heparin with HD for now- dont see where HIT has been checked - will try some tight heparin with HD 6. Dispo- for access Friday, will do HD here on Sat first shift  then if  no other issues could be discharged    [place]: Basic Metabolic Panel: Recent Labs  Lab 08/03/17 0221 08/04/17 0251 08/05/17 0220 08/06/17 0751 08/08/17 0243  NA 138 135  135 133* 129* 135  K 3.4* 3.3*  3.2* 3.6 3.4* 4.0  CL 98* 96*  96* 97* 94* 99*  CO2 27 26  26 26 22 26   GLUCOSE 170* 184*  185* 225* 142* 177*  BUN 46* 58*  56* 33* 48* 49*  CREATININE 3.83* 4.85*  4.74* 3.19* 4.59* 4.32*  CALCIUM 7.3* 6.7*  6.7* 6.7* 6.5* 7.5*  PHOS 3.5 4.9*  --   --   --    Liver Function Tests: Recent Labs  Lab 08/05/17 0220 08/06/17 0751 08/08/17 0243  AST 45* 33 33  ALT 355* 237* 144*  ALKPHOS 96 92 88  BILITOT 0.5 0.4 0.4  PROT 5.1* 5.2* 5.3*  ALBUMIN 2.5* 2.6* 2.7*   No results for input(s): LIPASE, AMYLASE in the last 168 hours. No results for input(s): AMMONIA in the last 168 hours. CBC: Recent Labs  Lab 08/05/17 0220 08/06/17 0751  08/07/17 0222 08/07/17 1219 08/08/17 0243  WBC 11.8* 12.4* 12.7* 13.0* 11.1*  HGB 9.0* 8.6* 8.7* 8.6* 8.5*  HCT 26.2* 25.4* 25.6* 25.3* 25.2*  MCV 89.7 89.8 90.8 91.0 90.6  PLT 100* 133* 141* 164 186   Cardiac Enzymes: No results for input(s): CKTOTAL, CKMB, CKMBINDEX, TROPONINI in the last 168 hours. CBG: Recent Labs  Lab 08/07/17 1134 08/07/17 1322 08/07/17 1624 08/07/17 2034 08/08/17 0725  GLUCAP 324* 290* 219* 252* 161*    Iron Studies: No results for input(s): IRON, TIBC, TRANSFERRIN, FERRITIN in the last 72 hours. Studies/Results: No results found. Medications: Infusions: . sodium chloride    . sodium chloride    . vancomycin      Scheduled Medications: . calcium acetate  2,001 mg Oral TID WC  . [START ON 08/09/2017] darbepoetin (ARANESP) injection - DIALYSIS  200 mcg Intravenous Q Sat-HD  . insulin aspart  0-5 Units Subcutaneous QHS  . insulin aspart  0-9 Units Subcutaneous TID WC  . isosorbide mononitrate  15 mg Oral Daily  . losartan  50 mg Oral QHS  . mouth rinse  15 mL  Mouth Rinse BID  . sodium chloride flush  3 mL Intravenous Q12H  . tamsulosin  0.4 mg Oral QPC breakfast    have reviewed scheduled and prn medications.  Physical Exam: General: NAD "starving to death"  Heart: RRR Lungs: mostly clear Abdomen: soft, non tender Extremities: min edema Dialysis Access: femoral vascath removed   08/08/2017,9:40 AM  LOS: 9 days

## 2017-08-08 NOTE — Anesthesia Procedure Notes (Signed)
Procedure Name: MAC Date/Time: 08/08/2017 11:23 AM Performed by: Jenne Campus, CRNA Pre-anesthesia Checklist: Patient identified, Emergency Drugs available, Suction available and Patient being monitored Oxygen Delivery Method: Nasal cannula

## 2017-08-08 NOTE — Progress Notes (Signed)
Family Medicine Teaching Service Daily Progress Note Intern Pager: [phone]  Patient name: Joseph Mcdonald Medical record number: [id number] Date of birth: [birth year] Age: 67 y.o. Gender: male  Primary Care Provider: Mercy Riding, MD Consultants: Nephrology, Cardiology Code Status: Full  Pt Overview and Major Events to Date:  5/08 Admitted 5/09 HD #1 5/10 VVS vein mapping, CT abd/pelvis neg 5/11 HD #2, on room air 5/13 heart cath  5/15 HD w/ removal of fem cath  *5/17 planned permcath placement  Assessment and Plan: Joseph Mcdonald is a 67 y.o. male presenting with dyspnea and abdominal pain. PMH is significant for CAD status post CABG 2011, CKD 4, DM-2, hypertension, GERD, chronic back pain, secondary hyperparathyroidism, anemia of renal disease, gout, bradycardia.     New onset HFrEF: on room air.  ECG w/ 1st degree heart block and QT prolongation,Cath with significant stenosis 70-100% in multiple vessels per cath.   Also with ESRD awaiting long term HD access.   -Nephrology consulted, appreciate recommendations, s/p HD 5/9, 5/11, 5/13, 5/15, permcath pending placement on 5/17 with next HD planned 5/18 -Cardiology consulted, appreciate recommendations- suggested medical management of extensive stenosis from cath, imdur currently -Daily weights, I&O's -Telemetry -transfer down to med-surg, d/c continuous pulse ox -AM ecg ordered    Elevated transaminases: AST resolved, ALT steadily improving through 5/17.  Pain resolved, Patient continues to have bowel movements.  Hepatic panel negative.  GI consulted considering hemochromatosis work-up given negative CT abdomen and pelvis and right upper quadrant ultrasound.  - GI consulted, appreciate recommendations- after cath, could consider MRI w/o contrast vs jugular liver biopsy, but may not be necessary -GI will remain available for phone call   Anemia of chronic inflammation  Thrombocytopenia  Iron overload: stable @  Hemoglobin 8.5  and platelets improving slightly to 186.  Does have signs of iron overload.  Possibly hemochromatosis although now believed less likely given improvement - GI following. - Continue monitoring CBC  .  Acute on chronic CKD 4  Hyperkalemia  Anion gap metabolic acidosis: Hyperkalemia and metabolic acidosis resolved following HD.  Has progressed to ESRD.  Nephrology currently working on obtaining permanent access. - managed with HD via nephro -[facility] acces pending for 5/17, HD planned 5/18 -vein mapped and BVT potentially next week -HD outpatient arranged    Diabetes mellitus type 2: chronic.  A1c 7.2.  Takes Lantus and Victoza at home.  Needed 12 units novolog yesterday, currently NPO for permcath so not starting lantus -Sensitive sliding scale with meals, adding bedtime coverage -hold off on starting lantus given low novolog use in prior day    Hypertension: Chronic.  Managing primarily via HD.  - Continue hydralazine as needed and continue 10 mg TID   Urinary retention: Likely related to BPH indicated on CT abdomen and pelvis. -cont Flomax - Foley cath in and out as needed following bladder scans  FEN/GI: Renal diet PPx: SCDs   Disposition:  Plan to continue HD and place HD today for HD 5/18, now has outpatient HD arranged.   GI states may not need MRI w/o contrast.  Potential D/c 5/18  Subjective:  Feeling well, optimistic about going home saturday  Objective: Temp:  [98.3 F (36.8 C)-98.7 F (37.1 C)] 98.4 F (36.9 C) (05/17 0004) Pulse Rate:  [47-55] 55 (05/17 0004) Resp:  [18-22] 20 (05/17 0004) BP: (143-164)/(62-73) 164/73 (05/17 0004) SpO2:  [98 %-99 %] 99 % (05/17 0004) Weight:  [156 lb 8.4 oz (71 kg)-156 lb 12  oz (71.1 kg)] 156 lb 12 oz (71.1 kg) (05/17 0231) Physical Exam: General: NAD, pleasant  Eyes: PERRL, EOMI, no conjunctival pallor or injection ENTM: Moist mucous membranes, no pharyngeal erythema or exudate Neck: Supple, no  LAD Cardiovascular: RRR, 2/6 murmur,  no LE edema Respiratory: CTA BL, normal work of breathing Gastrointestinal: soft, nontender, nondistended, normoactive BS MSK: moves 4 extremities equally Derm: no rashes appreciated Neuro: CN II-XII grossly intact Psych: AOx3, appropriate affect    Laboratory: Recent Labs  Lab 08/07/17 0222 08/07/17 1219 08/08/17 0243  WBC 12.7* 13.0* 11.1*  HGB 8.7* 8.6* 8.5*  HCT 25.6* 25.3* 25.2*  PLT 141* 164 186   Recent Labs  Lab 08/05/17 0220 08/06/17 0751 08/08/17 0243  NA 133* 129* 135  K 3.6 3.4* 4.0  CL 97* 94* 99*  CO2 26 22 26   BUN 33* 48* 49*  CREATININE 3.19* 4.59* 4.32*  CALCIUM 6.7* 6.5* 7.5*  PROT 5.1* 5.2* 5.3*  BILITOT 0.5 0.4 0.4  ALKPHOS 96 92 88  ALT 355* 237* 144*  AST 45* 33 33  GLUCOSE 225* 142* 177*   5/11 ANA neg 5/11 Ceruloplasmin 29.7 (wnl) 5/11 PTH: 121 5/11 IgG 585, IgA 264, IgM 48 5/10 GGT: 256 5/10 Lipase: 80 < 89 5/10 FOBT: Positive 5/09 Troponin I: 11.3 <11.0 <11.75 <11.58 5/09 HBV panel: Negative 5/09 Ferritin: >7,500 5/08 UA: Moderate hemoglobin, 150 glucose, >300 protein 5/08 Blood cultures x2: NGTD 5/08 Ethanol: Negative 5/08 Acetaminophen level: Negative 5/08 Ammonia: 35 5/08 Reticulocytes: 2.44 5/08 Iron studies: Iron 230, TIBC 238, saturation 97% 5/08 Folate: 11.7 5/08 B12: 4551 5/08 Hepatitis panel: Negative 5/08 INR: 1.71 5/08 BNP: 4146 (1057, 1 year ago)  Imaging/Diagnostic Tests: CT ABDOMEN PELVIS W CONTRAST (08/01/2017) 1. Small right pleural effusion and minimal left pleural effusion. 2. Mild bilateral lower lobe dependent atelectasis. 3. Cardiomegaly. 4. Mild diffuse hepatic steatosis. 5. Small amount of air in the urinary bladder, most likely due to recent catheterization. 6.  Calcific coronary artery and aortic atherosclerosis. 7. Mild prostatic hypertrophy.  ABDOMEN ULTRASOUND COMPLETE (07/30/2017) Unremarkable ultrasound of the abdomen. No acute intra-abdominal  findings. Uncomplicated appearing renal cystic disease without hydronephrosis or solid mass. Hepatic steatosis.  No gallstones or biliary ductal dilatation.  CHEST - 2 (07/30/2017) Cardiac enlargement and low bilateral lung volumes.   Sherene Sires, DO 08/08/2017, 6:06 AM PGY-1, [place] Intern pager: [phone], text pages welcome

## 2017-08-08 NOTE — Telephone Encounter (Signed)
Daughter Joseph Mcdonald brought in [facility] paperwork to be completed.  Please complete the forms and call daughter when they are ready to be picked up. Forms placed in white folder at check in

## 2017-08-08 NOTE — Transfer of Care (Signed)
Immediate Anesthesia Transfer of Care Note  Patient: Joseph Mcdonald  Procedure(s) Performed: INSERTION OF TUNNELED  DIALYSIS CATHETER (N/A ) BASILIC VEIN TRANSPOSITION LEFT (Left )  Patient Location: PACU  Anesthesia Type:MAC  Level of Consciousness: awake, oriented and patient cooperative  Airway & Oxygen Therapy: Patient Spontanous Breathing and Patient connected to nasal cannula oxygen  Post-op Assessment: Report given to RN and Post -op Vital signs reviewed and stable  Post vital signs: Reviewed  Last Vitals:  Vitals Value Taken Time  BP    Temp    Pulse 51 08/08/2017  1:44 PM  Resp 17 08/08/2017  1:44 PM  SpO2 100 % 08/08/2017  1:44 PM  Vitals shown include unvalidated device data.  Last Pain:  Vitals:   08/08/17 0726  TempSrc: Oral  PainSc:       Patients Stated Pain Goal: 0 (35/46/56 8127)  Complications: No apparent anesthesia complications

## 2017-08-08 NOTE — Op Note (Signed)
OPERATIVE NOTE  PROCEDURE: 1.  Right internal jugular vein tunneled dialysis catheter placement 2.  Right internal jugular vein cannulation under ultrasound guidance 3.  Left first stage basilic vein transposition   PRE-OPERATIVE DIAGNOSIS: end-stage renal failure  POST-OPERATIVE DIAGNOSIS: same as above  SURGEON: Adele Barthel, MD  ANESTHESIA: local and MAC  ESTIMATED BLOOD LOSS: 30 cc  FINDING(S): 1.  Tips of the catheter in the right atrium on fluoroscopy 2.  No obvious pneumothorax on fluoroscopy 3.  Basilic vein: 3 mm, acceptable 4.  Brachial artery: 3-4 mm, atherosclerotic disease evident with thickened anterior wall plaque 5.  Venous outflow: palpable thrill  6.  Radial flow: dopplerable radial signal  SPECIMEN(S):  none  INDICATIONS:   Joseph Mcdonald is a 67 y.o. male who presents with acute on chronic renal failure.  Nephrology feels this patient's end stage renal disease is likely permanent.  Based on his studies, he was a candidate for a staged left basilic vein transposition.  Additionally, the patient needed tunneled dialysis catheter placement until the fistula mature and was transposed.  The patient is aware the risks of tunneled dialysis catheter placement include but are not limited to: bleeding, infection, central venous injury, pneumothorax, possible venous stenosis, possible malpositioning in the venous system, and possible infections related to long-term catheter presence.  The patient is aware the risks include but are not limited to: bleeding, infection, steal syndrome, nerve damage, ischemic monomelic neuropathy, thrombosis, failure to mature, complications related to venous hypertension, need for additional procedures, death and stroke.  The patient was aware of these risks and agreed to proceed.   DESCRIPTION: After written full informed consent was obtained from the patient, the patient was taken back to the operating room.  Prior to induction,  the patient was given IV antibiotics.  After obtaining adequate sedation, the patient was prepped and draped in the standard fashion for a chest or neck tunneled dialysis catheter placement.     The cannulation site, the catheter exit site, and tract for the subcutaneous tunnel were then anesthestized with a total of 10 cc of 1% lidocaine without epinephrine.  Under ultrasound guidance, the right internal jugular vein was cannulated with the 18 gauge needle.  A J-wire was then placed down into the inferior vena cava under fluoroscopic guidance.  The wire was then secured in place with a clamp to the drapes.  The tapered wire guide was left occluding the lumen of the needle to avoid air embolism.    I then made stab incisions at the neck and exit sites.   I dissected from the exit site to the cannulation site with a tunneler.   The wire was then unclamped and I removed the needle.  The skin tract and venotomy was dilated serially with graduated dilators, passed under fluoroscopic guidance.   Finally, the dilator-sheath was placed under fluoroscopic guidance into the superior vena cava.  The central dilator and wire were removed.  A 23 cm Palindrome catheter was placed under fluoroscopic guidance down into the right atrium.    The sheath was broken and peeled away while holding the catheter cuff at the level of the skin.  The catheter was clamped with the plastic clamp and the back end of this catheter was transected.  One lumen was docked onto the tunneler.  The catheter was delivered through the subcutaneous tunnel by pulling the tunneler out the exit site.  The catheter was reclamped and was transected a second time, revealing  the two lumens of this catheter.  The catheter collar was loaded over the back end of the catheter.  The two  ports were docked onto these two lumens.  The catheter collar was then snapped into place, securing the two ports.  Each port was tested by aspirating and flushing.  No  resistance was noted.  Each port was then thoroughly flushed with heparinized saline.  The catheter was secured in placed with two interrupted stitches of 3-0 Nylon tied to the catheter.  The neck incision was closed with a U-stitch of 4-0 Monocryl.  The neck and chest incision were cleaned.  The closed neck incision was reinforced with Dermabond and sterile bandages applied to the catheter exit site.  Each port was then loaded with concentrated heparin (1000 Units/mL) at the manufacturer recommended volumes to each port.  Sterile caps were applied to each port.    On completion fluoroscopy, the tips of the catheter were in the right atrium, and there was no evidence of pneumothorax.  The drapes were taken down then the patient was repositioned for a left arm access procedure.  The patient was then reprepped and redraped in the standard fashion for a left arm access procedure.  I turned my attention first to identifying the patient's basilic vein and brachial artery.    Using SonoSite guidance, the location of these vessels were marked out on the skin.   At this point, I injected local anesthetic to obtain a field block of the antecubitum.  In total, I injected about 5 mL of 1% lidocaine without epinephrine.  I made a longitudinal incision at the level of the proximal forearm and dissected through the subcutaneous tissue and fascia to gain exposure of the brachial artery.  This was noted to be 3-4 mm in diameter externally.  This was dissected out proximally and distally and controlled with vessel loops .  I then dissected out the cubital vein draining into the basilic vein.  This was noted to be 3 mm in diameter externally.  The distal segment of the vein was ligated with a  2-0 silk, and the vein was transected.  The proximal segment was interrogated with serial dilators.  The vein accepted up to a 3 mm dilator without any difficulty.  I then instilled the heparinized saline into the vein and clamped it.  At  this point, I reset my exposure of the brachial artery and placed the artery under tension proximally and distally.  I made an arteriotomy with a #11 blade, and then I extended the arteriotomy with a Potts scissor.  I injected heparinized saline proximal and distal to this arteriotomy.  The vein was then sewn to the artery in an end-to-side configuration with a running stitch of 7-0 Prolene.  Prior to completing this anastomosis, I allowed the vein and artery to backbleed.  There was no evidence of clot from any vessels.  I completed the anastomosis in the usual fashion and then released all vessel loops and clamps.    There was a palpable thrill in the venous outflow, and there was a dopplerable radial signal.  At this point, I irrigated out the surgical wound.  There was no further active bleeding.  The subcutaneous tissue was reapproximated with a running stitch of 3-0 Vicryl.  The skin was then reapproximated with a running subcuticular stitch of 4-0 Vicryl.  The skin was then cleaned, dried, and reinforced with Dermabond.  The patient tolerated this procedure well.    COMPLICATIONS:  none  CONDITION: stable   Adele Barthel, MD, [facility] [place] Office: [phone] Pager: [phone]  08/08/2017, 11:52 AM

## 2017-08-08 NOTE — Anesthesia Preprocedure Evaluation (Addendum)
Anesthesia Evaluation  Patient identified by MRN, date of birth, ID band Patient awake    Reviewed: Allergy & Precautions, NPO status , Patient's Chart, lab work & pertinent test results  Airway Mallampati: II  TM Distance: >3 FB Neck ROM: Full    Dental  (+) Edentulous Upper, Edentulous Lower, Dental Advisory Given   Pulmonary     + decreased breath sounds(-) wheezing      Cardiovascular hypertension, Pt. on medications + CAD, + Past MI and + CABG   Rhythm:Regular Rate:Bradycardia     Neuro/Psych    GI/Hepatic   Endo/Other  diabetes, Type 2  Renal/GU Renal disease     Musculoskeletal   Abdominal   Peds  Hematology   Anesthesia Other Findings   Reproductive/Obstetrics                            Anesthesia Physical Anesthesia Plan  ASA: III  Anesthesia Plan: MAC   Post-op Pain Management:    Induction:   PONV Risk Score and Plan: 1 and Ondansetron, Dexamethasone and Treatment may vary due to age or medical condition  Airway Management Planned: Natural Airway  Additional Equipment:   Intra-op Plan:   Post-operative Plan:   Informed Consent: I have reviewed the patients History and Physical, chart, labs and discussed the procedure including the risks, benefits and alternatives for the proposed anesthesia with the patient or authorized representative who has indicated his/her understanding and acceptance.   Dental advisory given  Plan Discussed with: CRNA, Anesthesiologist and Surgeon  Anesthesia Plan Comments:         Anesthesia Quick Evaluation

## 2017-08-08 NOTE — Telephone Encounter (Signed)
Clinical info completed on FMLA form.  Place form in Dr. Juliann Pares box for completion.  Katharina Caper, April D, [place]

## 2017-08-09 LAB — GLUCOSE, CAPILLARY
GLUCOSE-CAPILLARY: 193 mg/dL — AB (ref 65–99)
GLUCOSE-CAPILLARY: 230 mg/dL — AB (ref 65–99)
Glucose-Capillary: 123 mg/dL — ABNORMAL HIGH (ref 65–99)
Glucose-Capillary: 170 mg/dL — ABNORMAL HIGH (ref 65–99)

## 2017-08-09 MED ORDER — ISOSORBIDE MONONITRATE ER 30 MG PO TB24: 15.0000 mg | ORAL_TABLET | Freq: Every day | ORAL | 0 refills | Status: DC

## 2017-08-09 MED ORDER — CALCIUM ACETATE (PHOS BINDER) 667 MG PO CAPS: 2001.0000 mg | ORAL_CAPSULE | Freq: Three times a day (TID) | ORAL | 0 refills | Status: DC

## 2017-08-09 MED ORDER — INSULIN GLARGINE 100 UNIT/ML ~~LOC~~ SOLN
10.0000 [IU] | Freq: Every day | SUBCUTANEOUS | Status: DC
  Administered 2017-08-09: 10 [IU] via SUBCUTANEOUS
  Filled 2017-08-09: qty 0.1

## 2017-08-09 MED ORDER — LOSARTAN POTASSIUM 50 MG PO TABS: 50.0000 mg | ORAL_TABLET | Freq: Every day | ORAL | 0 refills | Status: DC

## 2017-08-09 MED ORDER — INSULIN GLARGINE 100 UNIT/ML ~~LOC~~ SOLN
20.0000 [IU] | Freq: Every day | SUBCUTANEOUS | Status: DC
  Filled 2017-08-09: qty 0.2

## 2017-08-09 MED ORDER — DARBEPOETIN ALFA 200 MCG/0.4ML IJ SOSY
PREFILLED_SYRINGE | INTRAMUSCULAR | Status: AC
  Filled 2017-08-09: qty 0.4

## 2017-08-09 MED ORDER — RENA-VITE PO TABS: 1.0000 | ORAL_TABLET | Freq: Every day | ORAL | 0 refills | Status: DC

## 2017-08-09 MED ORDER — ALLOPURINOL 100 MG PO TABS: 100.0000 mg | ORAL_TABLET | ORAL | 6 refills | Status: AC

## 2017-08-09 MED ORDER — RENA-VITE PO TABS: 1.0000 | ORAL_TABLET | Freq: Every day | ORAL | Status: DC

## 2017-08-09 MED ORDER — TAMSULOSIN HCL 0.4 MG PO CAPS: 0.4000 mg | ORAL_CAPSULE | Freq: Every day | ORAL | 0 refills | Status: DC

## 2017-08-09 MED ORDER — BASAGLAR KWIKPEN 100 UNIT/ML ~~LOC~~ SOPN
10.0000 [IU] | PEN_INJECTOR | Freq: Every morning | SUBCUTANEOUS | 3 refills | Status: AC
Start: 2017-08-09 — End: ?

## 2017-08-09 NOTE — Procedures (Signed)
Patient was seen on dialysis and the procedure was supervised.  BFR 400  Via PC BP is  138/65 .   Patient appears to be tolerating treatment well  Joseph Mcdonald A 08/09/2017

## 2017-08-09 NOTE — Progress Notes (Signed)
Subjective:  No new c/o's-  has spot in [place] TTS- s/p access- to get HD later today and then he wants to go home  Objective Vital signs in last 24 hours: Vitals:   08/08/17 1546 08/08/17 2340 08/09/17 0145 08/09/17 0722  BP: (!) 173/75 (!) 154/67  137/60  Pulse: (!) 59 (!) 47  (!) 47  Resp: 20   18  Temp: 98 F (36.7 C) 98.4 F (36.9 C)  98.4 F (36.9 C)  TempSrc: Oral Oral  Oral  SpO2: 97% 100%  100%  Weight:   71.3 kg (157 lb 3 oz)   Height:       Weight change: 0.2 kg (7.1 oz)  Intake/Output Summary (Last 24 hours) at 08/09/2017 5638 Last data filed at 08/09/2017 0831 Gross per 24 hour  Intake 1863 ml  Output 810 ml  Net 1053 ml    Assessment/ Plan: Pt is a 68 y.o. yo male DM, HTN, CAD, and baseline CKD 4 who was admitted on 07/30/2017 with A on CRF req emergent HD on 5/9 and has cont to require HD- making plans for new start to chronic HD  Assessment/Plan: 1. Renal- new start- had been doing on MWF schedule, - making some urine but numbers worsening in between HD treatments-  had temp fem HD cath placed 5/9- removed 5/15.   VVS  placed TDC and left first stage BVT 5/17.   Low K on higher K bath.  Has spot TTS in [place] - will plan on next HD today , then appears plan is for discharge on 5/19 but he wants to go tonight  2. HTN/vol-  EF 30% cards work up in process- cath 5/14 with disease.  Medical management planned. Volume status seems OK, making urine on no lasix - on hydralazine- since commited to HD have change to long acting ARB- - BP still a little high, may need increase, will watch. Can titrate as OP   3. Anemia- considering hemachromatosis- is a carrier so should not need treatment- per primary/GI- on aranesp 100 q Sat - will inc to 200 this Sat for hgb 8.5 4. Secondary hyperparathyroidism- PTH 121 no meds- phos well controlled on phoslo  5. Low platelets- trending up a lot - - dont see where HIT has been checked - trying  some tight heparin with HD 6. Dispo- for access  Friday, will do HD here on Sat - looks like plan is for him to be discharged tomorrow- he wants to go tonight- will get him in for HD when can and notify primary team      Joseph Mcdonald A    Labs: Basic Metabolic Panel: Recent Labs  Lab 08/03/17 0221 08/04/17 0251 08/05/17 0220 08/06/17 0751 08/08/17 0243  NA 138 135  135 133* 129* 135  K 3.4* 3.3*  3.2* 3.6 3.4* 4.0  CL 98* 96*  96* 97* 94* 99*  CO2 27 26  26 26 22 26   GLUCOSE 170* 184*  185* 225* 142* 177*  BUN 46* 58*  56* 33* 48* 49*  CREATININE 3.83* 4.85*  4.74* 3.19* 4.59* 4.32*  CALCIUM 7.3* 6.7*  6.7* 6.7* 6.5* 7.5*  PHOS 3.5 4.9*  --   --   --    Liver Function Tests: Recent Labs  Lab 08/05/17 0220 08/06/17 0751 08/08/17 0243  AST 45* 33 33  ALT 355* 237* 144*  ALKPHOS 96 92 88  BILITOT 0.5 0.4 0.4  PROT 5.1* 5.2* 5.3*  ALBUMIN 2.5* 2.6* 2.7*  No results for input(s): LIPASE, AMYLASE in the last 168 hours. No results for input(s): AMMONIA in the last 168 hours. CBC: Recent Labs  Lab 08/05/17 0220 08/06/17 0751 08/07/17 0222 08/07/17 1219 08/08/17 0243  WBC 11.8* 12.4* 12.7* 13.0* 11.1*  HGB 9.0* 8.6* 8.7* 8.6* 8.5*  HCT 26.2* 25.4* 25.6* 25.3* 25.2*  MCV 89.7 89.8 90.8 91.0 90.6  PLT 100* 133* 141* 164 186   Cardiac Enzymes: No results for input(s): CKTOTAL, CKMB, CKMBINDEX, TROPONINI in the last 168 hours. CBG: Recent Labs  Lab 08/08/17 1545 08/08/17 1735 08/08/17 1921 08/08/17 2118 08/09/17 0757  GLUCAP 252* 416* 449* 544* 123*    Iron Studies: No results for input(s): IRON, TIBC, TRANSFERRIN, FERRITIN in the last 72 hours. Studies/Results: Dg Chest Port 1 View  Result Date: 08/08/2017 CLINICAL DATA:  Dialysis catheter insertion. EXAM: PORTABLE CHEST 1 VIEW COMPARISON:  Chest x-ray dated Jul 30, 2017. FINDINGS: Interval placement of a tunneled right internal jugular dialysis catheter with the tip near the cavoatrial junction. Stable cardiomegaly status post CABG. Normal  pulmonary vascularity. Probable small left pleural effusion. No consolidation or pneumothorax. No acute osseous abnormality. IMPRESSION: 1. Stable cardiomegaly.  Probable small left pleural effusion. Electronically Signed   By: Titus Dubin M.D.   On: 08/08/2017 13:53   Dg Fluoro Guide Cv Line-no Report  Result Date: 08/08/2017 Fluoroscopy was utilized by the requesting physician.  No radiographic interpretation.   Medications: Infusions: . sodium chloride    . sodium chloride 500 mL (08/08/17 1018)    Scheduled Medications: . calcium acetate  2,001 mg Oral TID WC  . darbepoetin (ARANESP) injection - DIALYSIS  200 mcg Intravenous Q Sat-HD  . insulin aspart  0-20 Units Subcutaneous TID WC  . insulin aspart  0-5 Units Subcutaneous QHS  . insulin glargine  10 Units Subcutaneous Daily  . isosorbide mononitrate  15 mg Oral Daily  . losartan  50 mg Oral QHS  . mouth rinse  15 mL Mouth Rinse BID  . sodium chloride flush  3 mL Intravenous Q12H  . tamsulosin  0.4 mg Oral QPC breakfast    have reviewed scheduled and prn medications.  Physical Exam: General: NAD  Heart: RRR Lungs: mostly clear Abdomen: soft, non tender Extremities: min edema Dialysis Access:  New right IJ pc and left BVT   08/09/2017,9:24 AM  LOS: 10 days

## 2017-08-09 NOTE — Anesthesia Postprocedure Evaluation (Signed)
Anesthesia Post Note  Patient: Joseph Mcdonald  Procedure(s) Performed: INSERTION OF TUNNELED  DIALYSIS CATHETER (N/A ) BASILIC VEIN TRANSPOSITION LEFT (Left )     Patient location during evaluation: PACU Anesthesia Type: MAC Level of consciousness: awake and alert Pain management: pain level controlled Vital Signs Assessment: post-procedure vital signs reviewed and stable Respiratory status: spontaneous breathing, nonlabored ventilation, respiratory function stable and patient connected to nasal cannula oxygen Cardiovascular status: stable and blood pressure returned to baseline Postop Assessment: no apparent nausea or vomiting Anesthetic complications: no    Last Vitals:  Vitals:   08/08/17 2340 08/09/17 0722  BP: (!) 154/67 137/60  Pulse: (!) 47 (!) 47  Resp:  18  Temp: 36.9 C 36.9 C  SpO2: 100% 100%    Last Pain:  Vitals:   08/09/17 0914  TempSrc:   PainSc: 5                  Wilmar Prabhakar

## 2017-08-09 NOTE — Progress Notes (Signed)
Family Medicine Teaching Service Daily Progress Note Intern Pager: [phone]  Patient name: Joseph Mcdonald Medical record number: [id number] Date of birth: 12/19/1950 Age: 67 y.o. Gender: male  Primary Care Provider: Mercy Riding, MD Consultants: Nephrology, Cardiology, GI Code Status: Full  Pt Overview and Major Events to Date:  5/08 Admitted 5/09 HD #1 5/10 VVS vein mapping, CT abd/pelvis neg 5/11 HD #2, on room air 5/13 heart cath  5/15 HD w/ removal of fem cath  5/17 permcath placement  Assessment and Plan: Joseph Mcdonald is a 67 y.o. male presenting with dyspnea and abdominal pain. PMH is significant for CAD status post CABG 2011, CKD 4, DM-2, hypertension, GERD, chronic back pain, secondary hyperparathyroidism, anemia of renal disease, gout, bradycardia.   1.  New onset HFrEF: Stable.  Has history of CABG with appropriate perfusion based on catheterization.  Cardiology recommending medical management.  Currently receiving HD due to new onset ESRD. - Nephrology consulted, appreciate recommendations, HD 5/9, 5/11, 5/13, 5/15 with PermCath placement 5/17 and plan dialysis later today 5/18 - Cardiology consulted, appreciate recommendations - Telemetry and continuous pulse ox - Daily weights, I&O's  2.  Elevated transaminases: Resolved.  Thought to be related to fluid overload state though hemochromatosis work-up has been pursued as recommended by GI. - Plan per above - Monitor outpatient with GI  3.  Anemia of chronic inflammation  Thrombocytopenia  Iron overload: Stable.  Suspect hemochromatosis per GI though labs are reassuring. - Follow-up GI outpatient - Monitor CBC  4.  AoCKD 4  Hyperkalemia  Anion gap metabolic acidosis: Hyperkalemia and metabolic acidosis resolved.  Has progressed to ESRD, now receiving HD.  Status post PermCath placement 5/17. - Nephrology consulted, appreciate recommendations - Patient has received CLIP  5.  Diabetes mellitus  type 2: Chronic.  A1c 7.2.  Takes Lantus and Victoza at home. - Sensitive sliding scale with meals and at bedtime coverage - Restarting home Lantus at half dose, 10 units daily  6.  Hypertension: Chronic.  Normotensive.  Now receiving HD. - Continue hydralazine 10 mg 3 times daily and as needed  7.  Urinary retention: Likely related to BPH indicated on CT abdomen and pelvis. - Continue Flomax daily - Foley catheter out as needed following bladder scans  FEN/GI: Renal diet PPx: SCDs   Disposition:  Pending improvement of new onset hyperglycemia following steroids s/p permcath. Likely d/c home 5/19.  Subjective:  Patient states he feels well this morning.  Has already had breakfast.  No new complaints.  Objective: Temp:  [97.3 F (36.3 C)-98.4 F (36.9 C)] 98.4 F (36.9 C) (05/18 0722) Pulse Rate:  [47-59] 47 (05/18 0722) Resp:  [13-20] 18 (05/18 0722) BP: (137-182)/(60-170) 137/60 (05/18 0722) SpO2:  [97 %-100 %] 100 % (05/18 0722) Weight:  [157 lb 3 oz (71.3 kg)] 157 lb 3 oz (71.3 kg) (05/18 0145) Physical Exam: General: well nourished, well developed, NAD with non-toxic appearance HEENT: normocephalic, atraumatic, moist mucous membranes Neck: supple, non-tender without lymphadenopathy Cardiovascular: regular rate and rhythm with 2/6 systolic murmur Lungs: clear to auscultation bilaterally with normal work of breathing Abdomen: soft, non-tender, non-distended, normoactive bowel sounds Skin: warm, dry, no rashes or lesions, cap refill < 2 seconds, PermCath intact on right chest with dry dressings Extremities: warm and well perfused, normal tone, no edema  Laboratory: Recent Labs  Lab 08/07/17 0222 08/07/17 1219 08/08/17 0243  WBC 12.7* 13.0* 11.1*  HGB 8.7* 8.6* 8.5*  HCT 25.6* 25.3* 25.2*  PLT 141*  164 186   Recent Labs  Lab 08/05/17 0220 08/06/17 0751 08/08/17 0243  NA 133* 129* 135  K 3.6 3.4* 4.0  CL 97* 94* 99*  CO2 26 22 26   BUN 33* 48* 49*   CREATININE 3.19* 4.59* 4.32*  CALCIUM 6.7* 6.5* 7.5*  PROT 5.1* 5.2* 5.3*  BILITOT 0.5 0.4 0.4  ALKPHOS 96 92 88  ALT 355* 237* 144*  AST 45* 33 33  GLUCOSE 225* 142* 177*   5/11 ANA neg 5/11 Ceruloplasmin 29.7 (wnl) 5/11 PTH: 121 5/11 IgG 585, IgA 264, IgM 48 5/10 GGT: 256 5/10 Lipase: 80 < 89 5/10 FOBT: Positive 5/09 Troponin I: 11.3 <11.0 <11.75 <11.58 5/09 HBV panel: Negative 5/09 Ferritin: >7,500 5/08 UA: Moderate hemoglobin, 150 glucose, >300 protein 5/08 Blood cultures x2: NGTD 5/08 Ethanol: Negative 5/08 Acetaminophen level: Negative 5/08 Ammonia: 35 5/08 Reticulocytes: 2.44 5/08 Iron studies: Iron 230, TIBC 238, saturation 97% 5/08 Folate: 11.7 5/08 B12: 4551 5/08 Hepatitis panel: Negative 5/08 INR: 1.71 5/08 BNP: 4146 (1057, 1 year ago)  Imaging/Diagnostic Tests: CT ABDOMEN PELVIS W CONTRAST (08/01/2017) 1. Small right pleural effusion and minimal left pleural effusion. 2. Mild bilateral lower lobe dependent atelectasis. 3. Cardiomegaly. 4. Mild diffuse hepatic steatosis. 5. Small amount of air in the urinary bladder, most likely due to recent catheterization. 6.  Calcific coronary artery and aortic atherosclerosis. 7. Mild prostatic hypertrophy.  ABDOMEN ULTRASOUND COMPLETE (07/30/2017) Unremarkable ultrasound of the abdomen. No acute intra-abdominal findings. Uncomplicated appearing renal cystic disease without hydronephrosis or solid mass. Hepatic steatosis.  No gallstones or biliary ductal dilatation.  CHEST - 2 (07/30/2017) Cardiac enlargement and low bilateral lung volumes.    [place] Bing, DO 08/09/2017, 7:57 AM PGY-2, [place] Intern pager: [phone], text pages welcome

## 2017-08-09 NOTE — Discharge Instructions (Signed)
Please follow-up with your primary care doctor.

## 2017-08-09 NOTE — Progress Notes (Signed)
GI Discharge/Sign-Off Note  Subjective: Patient has had ESRD and now is on hemodialysis.  Had elevated LFTs with work-up negative other than marked elevation of iron studies with ferritin over 7000.  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  Typically this is of no significance to him.  I have informed him of this and he does need to notify his children and other relatives since they may well be carriers as well.  Since his iron studies were elevated he should probably have an MRI of the liver without contrast to assess for iron studies.  This could be done as an inpatient prior to discharge or as an outpatient  Principal Problem:   Transaminitis Active Problems:   Acute renal failure (ARF) (HCC)   Non-ST elevation (NSTEMI) myocardial infarction [facility])   Type 2 diabetes mellitus with stage 4 chronic kidney disease (HCC)   CHF exacerbation (HCC)   Pressure injury of skin   Acute systolic CHF (congestive heart failure) ([place])   Results for orders placed or performed during the hospital encounter of 07/30/17 (from the past 72 hour(s))  Glucose, capillary     Status: Abnormal   Collection Time: 08/06/17 12:27 PM  Result Value Ref Range   Glucose-Capillary 109 (H) 65 - 99 mg/dL  Glucose, capillary     Status: Abnormal   Collection Time: 08/06/17  4:03 PM  Result Value Ref Range   Glucose-Capillary 224 (H) 65 - 99 mg/dL  Glucose, capillary     Status: Abnormal   Collection Time: 08/06/17  9:42 PM  Result Value Ref Range   Glucose-Capillary 341 (H) 65 - 99 mg/dL  CBC     Status: Abnormal   Collection Time: 08/07/17  2:22 AM  Result Value Ref Range   WBC 12.7 (H) 4.0 - 10.5 K/uL   RBC 2.82 (L) 4.22 - 5.81 MIL/uL   Hemoglobin 8.7 (L) 13.0 - 17.0 g/dL   HCT 25.6 (L) 39.0 - 52.0 %   MCV 90.8 78.0 - 100.0 fL   MCH 30.9 26.0 - 34.0 pg   MCHC 34.0 30.0 - 36.0 g/dL   RDW 13.8 11.5 - 15.5 %   Platelets  141 (L) 150 - 400 K/uL    Comment: Performed at [facility], [address], [place], [place] [address]  Glucose, capillary     Status: Abnormal   Collection Time: 08/07/17  7:37 AM  Result Value Ref Range   Glucose-Capillary 144 (H) 65 - 99 mg/dL  Glucose, capillary     Status: Abnormal   Collection Time: 08/07/17 11:34 AM  Result Value Ref Range   Glucose-Capillary 324 (H) 65 - 99 mg/dL  CBC     Status: Abnormal   Collection Time: 08/07/17 12:19 PM  Result Value Ref Range   WBC 13.0 (H) 4.0 - 10.5 K/uL   RBC 2.78 (L) 4.22 - 5.81 MIL/uL   Hemoglobin 8.6 (L) 13.0 - 17.0 g/dL   HCT 25.3 (L) 39.0 - 52.0 %   MCV 91.0 78.0 - 100.0 fL   MCH 30.9 26.0 - 34.0 pg   MCHC 34.0 30.0 - 36.0 g/dL   RDW 13.7 11.5 - 15.5 %   Platelets 164 150 - 400 K/uL    Comment: Performed at [facility], [place]. [address] [place], [place] [address]  Glucose, capillary     Status: Abnormal   Collection Time:  08/07/17  1:22 PM  Result Value Ref Range   Glucose-Capillary 290 (H) 65 - 99 mg/dL  Glucose, capillary     Status: Abnormal   Collection Time: 08/07/17  4:24 PM  Result Value Ref Range   Glucose-Capillary 219 (H) 65 - 99 mg/dL  Glucose, capillary     Status: Abnormal   Collection Time: 08/07/17  8:34 PM  Result Value Ref Range   Glucose-Capillary 252 (H) 65 - 99 mg/dL  Protime-INR     Status: None   Collection Time: 08/08/17  2:43 AM  Result Value Ref Range   Prothrombin Time 13.2 11.4 - 15.2 seconds   INR 1.01     Comment: Performed at [facility], [place] [address], [place], [place] [address]  Basic metabolic panel     Status: Abnormal   Collection Time: 08/08/17  2:43 AM  Result Value Ref Range   Sodium 135 135 - 145 mmol/L   Potassium 4.0 3.5 - 5.1 mmol/L   Chloride 99 (L) 101 - 111 mmol/L   CO2 26 22 - 32 mmol/L   Glucose, Bld 177 (H) 65 - 99 mg/dL   BUN 49 (H) 6 - 20 mg/dL   Creatinine, Ser 4.32 (H) 0.61 - 1.24 mg/dL   Calcium 7.5 (L) 8.9 - 10.3 mg/dL   GFR calc non  Af Amer 13 (L) >60 mL/min   GFR calc Af Amer 15 (L) >60 mL/min    Comment: (NOTE) The eGFR has been calculated using the CKD EPI equation. This calculation has not been validated in all clinical situations. eGFR's persistently <60 mL/min signify possible Chronic Kidney Disease.    Anion gap 10 5 - 15    Comment: Performed at [facility] [address] [place], [place] [address]  CBC     Status: Abnormal   Collection Time: 08/08/17  2:43 AM  Result Value Ref Range   WBC 11.1 (H) 4.0 - 10.5 K/uL   RBC 2.78 (L) 4.22 - 5.81 MIL/uL   Hemoglobin 8.5 (L) 13.0 - 17.0 g/dL   HCT 25.2 (L) 39.0 - 52.0 %   MCV 90.6 78.0 - 100.0 fL   MCH 30.6 26.0 - 34.0 pg   MCHC 33.7 30.0 - 36.0 g/dL   RDW 13.6 11.5 - 15.5 %   Platelets 186 150 - 400 K/uL    Comment: Performed at [facility], [place] [address], [place], [place] [address]  Hepatic function panel     Status: Abnormal   Collection Time: 08/08/17  2:43 AM  Result Value Ref Range   Total Protein 5.3 (L) 6.5 - 8.1 g/dL   Albumin 2.7 (L) 3.5 - 5.0 g/dL   AST 33 15 - 41 U/L   ALT 144 (H) 17 - 63 U/L   Alkaline Phosphatase 88 38 - 126 U/L   Total Bilirubin 0.4 0.3 - 1.2 mg/dL   Bilirubin, Direct <0.1 (L) 0.1 - 0.5 mg/dL   Indirect Bilirubin NOT CALCULATED 0.3 - 0.9 mg/dL    Comment: Performed at [place] [address], [place], [place] [address]  Glucose, capillary     Status: Abnormal   Collection Time: 08/08/17  7:25 AM  Result Value Ref Range   Glucose-Capillary 161 (H) 65 - 99 mg/dL  Glucose, capillary     Status: Abnormal   Collection Time: 08/08/17 11:10 AM  Result Value Ref Range   Glucose-Capillary 102 (H) 65 - 99 mg/dL  Glucose, capillary  Status: Abnormal   Collection Time: 08/08/17  1:53 PM  Result Value Ref Range   Glucose-Capillary 135 (H) 65 - 99 mg/dL  Glucose, capillary     Status: Abnormal   Collection Time: 08/08/17  3:45 PM  Result Value Ref Range   Glucose-Capillary 252 (H) 65 - 99 mg/dL   Glucose, capillary     Status: Abnormal   Collection Time: 08/08/17  5:35 PM  Result Value Ref Range   Glucose-Capillary 416 (H) 65 - 99 mg/dL  Glucose, capillary     Status: Abnormal   Collection Time: 08/08/17  7:21 PM  Result Value Ref Range   Glucose-Capillary 449 (H) 65 - 99 mg/dL  Glucose, capillary     Status: Abnormal   Collection Time: 08/08/17  9:18 PM  Result Value Ref Range   Glucose-Capillary 544 (HH) 65 - 99 mg/dL  Glucose, capillary     Status: Abnormal   Collection Time: 08/09/17  7:57 AM  Result Value Ref Range   Glucose-Capillary 123 (H) 65 - 99 mg/dL    Dg Chest Port 1 View  Result Date: 08/08/2017 CLINICAL DATA:  Dialysis catheter insertion. EXAM: PORTABLE CHEST 1 VIEW COMPARISON:  Chest x-ray dated Jul 30, 2017. FINDINGS: Interval placement of a tunneled right internal jugular dialysis catheter with the tip near the cavoatrial junction. Stable cardiomegaly status post CABG. Normal pulmonary vascularity. Probable small left pleural effusion. No consolidation or pneumothorax. No acute osseous abnormality. IMPRESSION: 1. Stable cardiomegaly.  Probable small left pleural effusion. Electronically Signed   By: Titus Dubin M.D.   On: 08/08/2017 13:53   Dg Fluoro Guide Cv Line-no Report  Result Date: 08/08/2017 Fluoroscopy was utilized by the requesting physician.  No radiographic interpretation.    '@MEDSSCHEDLED' @  GI DISCHARGE PLANNING:  Diet: No restriction      Labs/Procedures Ordered:  GI FOLLOW UP:  Call [phone] to make appointment.   Doctor: Dr. Paulita Fujita  Time: 1 to 2 months for follow-up of his liver test and to determine if MRI of the liver is needed   Laurence Spates, MD   Pager [phone] If no answer or after hours call [phone]

## 2017-08-10 ENCOUNTER — Encounter (HOSPITAL_COMMUNITY): Payer: Self-pay | Admitting: Vascular Surgery

## 2017-08-11 NOTE — Telephone Encounter (Signed)
FMLA forms faxed per Daughter's request. Copies in scan box. Wallace Cullens, RN

## 2017-08-11 NOTE — Telephone Encounter (Signed)
Form reviewed, completed, signed and placed in RN's box.  Note routed to RN team inbasket. Mercy Riding, MD

## 2017-08-12 ENCOUNTER — Telehealth: Payer: Self-pay | Admitting: Vascular Surgery

## 2017-08-12 NOTE — Telephone Encounter (Signed)
sch appt 09/24/17 130 pm p/o PA s/p L 1st stage BVT

## 2017-08-12 NOTE — Telephone Encounter (Signed)
-----   Message from Mena Goes, RN sent at 08/08/2017  3:15 PM EDT ----- Regarding: 6 weeks [facility] clinic   ----- Message ----- From: Conrad Willshire, MD Sent: 08/08/2017   1:23 PM To: Vvs Charge [address]  Joseph Mcdonald [id number] [birth year]  PROCEDURE: 1.  Right internal jugular vein tunneled dialysis catheter placement 2.  Right internal jugular vein cannulation under ultrasound guidance 3.  Left first stage basilic vein transposition   Asst: RNFA  Follow-up: 6 weeks ([facility])

## 2017-08-13 ENCOUNTER — Ambulatory Visit (INDEPENDENT_AMBULATORY_CARE_PROVIDER_SITE_OTHER): Payer: Medicare Other | Admitting: Internal Medicine

## 2017-08-13 ENCOUNTER — Encounter: Payer: Self-pay | Admitting: Internal Medicine

## 2017-08-13 VITALS — BP 120/60 | HR 55 | Temp 98.1°F | Ht 64.0 in | Wt 157.2 lb

## 2017-08-13 DIAGNOSIS — N186 End stage renal disease: Secondary | ICD-10-CM | POA: Diagnosis not present

## 2017-08-13 DIAGNOSIS — F411 Generalized anxiety disorder: Secondary | ICD-10-CM

## 2017-08-13 DIAGNOSIS — Z992 Dependence on renal dialysis: Secondary | ICD-10-CM

## 2017-08-13 DIAGNOSIS — I10 Essential (primary) hypertension: Secondary | ICD-10-CM

## 2017-08-13 DIAGNOSIS — R748 Abnormal levels of other serum enzymes: Secondary | ICD-10-CM

## 2017-08-13 MED ORDER — ESCITALOPRAM OXALATE 10 MG PO TABS: 10.0000 mg | ORAL_TABLET | Freq: Every day | ORAL | 0 refills | Status: DC

## 2017-08-13 NOTE — Patient Instructions (Signed)
Mr. Robbins,  Please restart lexapro 10 mg nightly. Please see Dr. Cyndia Skeeters if anixety not improved with restarting.  Try eucerin, cetaphil, or cerave lotion for itching.  Please ask dialysis center if you can put a dressing over your catheter for regular bathing.  I will refer you to gastroenterology and get your daughter her records.  Best, Dr. Ola Spurr

## 2017-08-15 ENCOUNTER — Encounter: Payer: Self-pay | Admitting: Internal Medicine

## 2017-08-15 DIAGNOSIS — N186 End stage renal disease: Secondary | ICD-10-CM | POA: Insufficient documentation

## 2017-08-15 DIAGNOSIS — Z992 Dependence on renal dialysis: Principal | ICD-10-CM

## 2017-08-15 NOTE — Assessment & Plan Note (Signed)
-   Receiving HD in [place] [facility] - Recommended eucerin, cetaphil, or cerave for skin moisturizing to keep skin of arms healthy

## 2017-08-15 NOTE — Progress Notes (Signed)
[facility] Progress Note  Subjective:  Joseph Mcdonald is a 67 y.o. male who was recently admitted from 5/8-5/18/19 with abdominal distension and found to be in acute renal failure and started on dialysis. He is accompanied by his wife and daughter Lattie Haw. His hospitalization was complicated by elevated transaminases, troponinemia, and anemia with elevated ferritin. EF was decreased at 35-40% with Cardiology recommending medical management with hope of improvement once on HD. He was also found to have multiple vessel occlusion on cardiac cath with recommendation of continuing nitrate and avoiding beta blocker due to bradycardia but deemed appropriate for fistula placement. Vein mapping was performed, and vascular surgery placed R IJ tunneled dialysis cath and left first stage BVT. Abdominal distension, dyspnea, and discomfort improved with HD. He had his first HD session at [facility] in [place] yesterday.  Recommendations for follow-up from inpatient team: 1. Patient now on HD.  T/TH/S.  Has tunneled cath for access.  Vein mapping done and BVT should be performed outpatient by vascular.  Did have femoral cath temporarily, please check for appropriate healing. 2. Patient with worsening HFrEF to 35-40% per ECHO and significant stenosis deemed "medical management only" per cath.  Med recs included below..  Patient should follow up with Cardiology. 3. There was initially elevated transaminases but those resolved with HD.  Consider checking liver function to ensure recovery persists.  GI had recommended outpatient followup with potential MRI w/o contrast vs biopsy. 4. 1 to 2 months for follow-up of his liver test and to determine if MRI of the liver is needed 5. Previously on hydralazine, since commited to HD have changed to long acting ARB (losartan) which may need increase. Monitor BP.  Since being discharged, patient reports fatigue and decreased appetite but no shortness of  breath or swelling like prior to hospitalization. He reports itching over tunneled cath and family wants to know how he can shower with it because they've been doing sponge baths. They are concerned he is going to get an infection of his fistula site because he has dry skin and typically picks at his arms. Patient reports anxiety and wants to know why his lexapro was stopped in the hospital. He asks if he can have xanax. His daughter requests copy of FMLA forms.   Allergies  Allergen Reactions  . Penicillins Hives, Itching and Other (See Comments)    PATIENT HAS HAD A PCN REACTION WITH IMMEDIATE RASH, FACIAL/TONGUE/THROAT SWELLING, SOB, OR LIGHTHEADEDNESS WITH HYPOTENSION:  #  #  YES  #  #  HAS PT DEVELOPED SEVERE RASH INVOLVING MUCUS MEMBRANES or SKIN NECROSIS: #  #  YES  #  # Has patient had a PCN reaction that required hospitalization No Has patient had a PCN reaction occurring within the last 10 years: No     Social History   Tobacco Use  . Smoking status: Never Smoker  . Smokeless tobacco: Never Used  Substance Use Topics  . Alcohol use: No    Objective: Blood pressure 120/60, pulse (!) 55, temperature 98.1 F (36.7 C), temperature source Oral, height 5\' 4"  (1.626 m), weight 157 lb 3.2 oz (71.3 kg), SpO2 97 %. Body mass index is 26.98 kg/m. Constitutional: Well-appearing male in NAD HENT: MMM Cardiovascular: RRR, S1, S2, systolic ejection murmur (noted previously). Thrill felt over left antecubital fossa--had patient and family feel this.  Pulmonary/Chest: Effort normal and breath sounds normal.  Abdominal: Soft. +BS, NT Musculoskeletal: No LE edema Skin: Skin is warm and dry. Dry  skin. Clean dressing over tunneled cath. Well-healing first stage BVT. Psychiatric: Somewhat flat affect.  Vitals reviewed  Assessment/Plan: ESRD on dialysis ([place]) - Receiving HD in [place] [facility] - Recommended eucerin, cetaphil, or cerave for skin moisturizing to keep skin of arms  healthy  Essential hypertension - At goal on new regimen with ARB  Abnormal transaminases - Placed referral to GI for continued outpatient workup  Generalized anxiety disorder - Recommended restarting lexapro. Unclear why this had been held. Counseled against adding benzodiazepine as also on narcotic.   Follow-up in about 1 month for reassessment of anxiety and repeat LFTs (unless sees GI sooner/nephrology able to send records if CMP performed--request sent)  Olene Floss, MD [place], PGY-3

## 2017-08-15 NOTE — Assessment & Plan Note (Signed)
-   At goal on new regimen with ARB

## 2017-08-15 NOTE — Assessment & Plan Note (Signed)
-   Placed referral to GI for continued outpatient workup

## 2017-08-15 NOTE — Assessment & Plan Note (Addendum)
-   Recommended restarting lexapro. Unclear why this had been held. Counseled against adding benzodiazepine as also on narcotic.

## 2017-08-20 ENCOUNTER — Other Ambulatory Visit: Payer: Self-pay

## 2017-08-20 DIAGNOSIS — M5440 Lumbago with sciatica, unspecified side: Principal | ICD-10-CM

## 2017-08-20 DIAGNOSIS — G8929 Other chronic pain: Secondary | ICD-10-CM

## 2017-08-20 MED ORDER — OXYCODONE-ACETAMINOPHEN 5-325 MG PO TABS: 1.0000 | ORAL_TABLET | Freq: Three times a day (TID) | ORAL | 0 refills | Status: DC | PRN

## 2017-08-20 NOTE — Telephone Encounter (Signed)
Woman left voicemail for patient's Oxycodone refill that is due on Monday.  [facility] [place].  Call back is [phone].  Danley Danker, RN [facility] RN)

## 2017-08-22 ENCOUNTER — Other Ambulatory Visit: Payer: Self-pay | Admitting: Student

## 2017-08-22 DIAGNOSIS — M5440 Lumbago with sciatica, unspecified side: Principal | ICD-10-CM

## 2017-08-22 DIAGNOSIS — G8929 Other chronic pain: Secondary | ICD-10-CM

## 2017-08-22 MED ORDER — OXYCODONE-ACETAMINOPHEN 5-325 MG PO TABS: 1.0000 | ORAL_TABLET | Freq: Three times a day (TID) | ORAL | 0 refills | Status: DC | PRN

## 2017-08-27 LAB — HM DIABETES EYE EXAM

## 2017-09-01 ENCOUNTER — Other Ambulatory Visit: Payer: Self-pay | Admitting: Family Medicine

## 2017-09-08 ENCOUNTER — Other Ambulatory Visit: Payer: Self-pay

## 2017-09-08 DIAGNOSIS — G4700 Insomnia, unspecified: Secondary | ICD-10-CM

## 2017-09-08 MED ORDER — TRAZODONE HCL 50 MG PO TABS: 50.0000 mg | ORAL_TABLET | Freq: Every evening | ORAL | 3 refills | Status: AC | PRN

## 2017-09-10 ENCOUNTER — Other Ambulatory Visit: Payer: Self-pay

## 2017-09-10 DIAGNOSIS — E7800 Pure hypercholesterolemia, unspecified: Secondary | ICD-10-CM

## 2017-09-10 MED ORDER — ATORVASTATIN CALCIUM 40 MG PO TABS: 40.0000 mg | ORAL_TABLET | Freq: Every day | ORAL | 3 refills | Status: DC

## 2017-09-11 ENCOUNTER — Other Ambulatory Visit: Payer: Self-pay

## 2017-09-11 MED ORDER — TAMSULOSIN HCL 0.4 MG PO CAPS: 0.4000 mg | ORAL_CAPSULE | Freq: Every day | ORAL | 0 refills | Status: DC

## 2017-09-11 MED ORDER — ISOSORBIDE MONONITRATE ER 30 MG PO TB24: 15.0000 mg | ORAL_TABLET | Freq: Every day | ORAL | 0 refills | Status: DC

## 2017-09-12 ENCOUNTER — Other Ambulatory Visit: Payer: Self-pay | Admitting: Student

## 2017-09-22 ENCOUNTER — Other Ambulatory Visit: Payer: Self-pay

## 2017-09-22 DIAGNOSIS — G8929 Other chronic pain: Secondary | ICD-10-CM

## 2017-09-22 DIAGNOSIS — M5440 Lumbago with sciatica, unspecified side: Principal | ICD-10-CM

## 2017-09-22 NOTE — Telephone Encounter (Signed)
Pts wife call nurse line requesting a refill on patients oxycodone. Please advise.

## 2017-09-23 ENCOUNTER — Telehealth: Payer: Self-pay | Admitting: Vascular Surgery

## 2017-09-23 MED ORDER — OXYCODONE-ACETAMINOPHEN 5-325 MG PO TABS: 1.0000 | ORAL_TABLET | Freq: Three times a day (TID) | ORAL | 0 refills | Status: DC | PRN

## 2017-09-23 NOTE — Telephone Encounter (Signed)
resch appt spk to pt 10/03/17 230pm Dialysis Duplex 4pm p/o PA

## 2017-09-26 ENCOUNTER — Other Ambulatory Visit: Payer: Self-pay

## 2017-09-26 DIAGNOSIS — Z992 Dependence on renal dialysis: Principal | ICD-10-CM

## 2017-09-26 DIAGNOSIS — Z48812 Encounter for surgical aftercare following surgery on the circulatory system: Secondary | ICD-10-CM

## 2017-09-26 DIAGNOSIS — N186 End stage renal disease: Secondary | ICD-10-CM

## 2017-09-29 ENCOUNTER — Telehealth: Payer: Self-pay | Admitting: Family Medicine

## 2017-09-29 ENCOUNTER — Other Ambulatory Visit: Payer: Self-pay

## 2017-09-29 DIAGNOSIS — E7800 Pure hypercholesterolemia, unspecified: Secondary | ICD-10-CM

## 2017-09-29 MED ORDER — ATORVASTATIN CALCIUM 40 MG PO TABS: 40.0000 mg | ORAL_TABLET | Freq: Every day | ORAL | 3 refills | Status: DC

## 2017-09-29 MED ORDER — POLYETHYLENE GLYCOL 3350 17 G PO PACK: 17.0000 g | PACK | Freq: Every day | ORAL | 3 refills | Status: AC | PRN

## 2017-09-29 NOTE — Telephone Encounter (Signed)
Patient needs refill of Miralax and Lipitor 40 mg.  [facility] [place] [place].

## 2017-10-01 ENCOUNTER — Other Ambulatory Visit: Payer: Self-pay

## 2017-10-02 MED ORDER — LOSARTAN POTASSIUM 50 MG PO TABS: ORAL_TABLET | ORAL | 3 refills | Status: DC

## 2017-10-02 MED ORDER — RENA-VITE PO TABS: ORAL_TABLET | ORAL | 3 refills | Status: AC

## 2017-10-03 ENCOUNTER — Other Ambulatory Visit: Payer: Self-pay | Admitting: *Deleted

## 2017-10-03 ENCOUNTER — Ambulatory Visit (HOSPITAL_COMMUNITY)
Admission: RE | Admit: 2017-10-03 | Discharge: 2017-10-03 | Disposition: A | Discharge status: Home / Self Care | Payer: Medicare Other | Source: Ambulatory Visit | Attending: Family | Admitting: Family

## 2017-10-03 ENCOUNTER — Ambulatory Visit (INDEPENDENT_AMBULATORY_CARE_PROVIDER_SITE_OTHER): Payer: Self-pay | Admitting: Physician Assistant

## 2017-10-03 ENCOUNTER — Other Ambulatory Visit: Payer: Self-pay

## 2017-10-03 ENCOUNTER — Encounter: Payer: Self-pay | Admitting: *Deleted

## 2017-10-03 ENCOUNTER — Encounter: Payer: Self-pay | Admitting: Physician Assistant

## 2017-10-03 VITALS — BP 104/60 | HR 62 | Temp 98.0°F | Resp 14 | Ht 64.0 in | Wt 156.0 lb

## 2017-10-03 DIAGNOSIS — N186 End stage renal disease: Secondary | ICD-10-CM | POA: Diagnosis not present

## 2017-10-03 DIAGNOSIS — Z48812 Encounter for surgical aftercare following surgery on the circulatory system: Secondary | ICD-10-CM | POA: Insufficient documentation

## 2017-10-03 DIAGNOSIS — Z992 Dependence on renal dialysis: Secondary | ICD-10-CM

## 2017-10-03 NOTE — Progress Notes (Signed)
Established Dialysis Access   History of Present Illness   Joseph Mcdonald is a 67 y.o. (1950-06-09) male who presents status post right IJ tunneled dialysis catheter placement as well as left first stage basilic vein transposition by Dr. Bridgett Larsson on 08/08/2017.  Patient states incision on his left arm is healed.  He denies signs or symptoms of steal syndrome.  He is dialyzing from right tunneled dialysis catheter without complication on a Tuesday Thursday Saturday schedule.  He is not taking any blood thinners.  He is willing to proceed with a second stage transposition.  The patient's PMH, PSH, SH, and FamHx were reviewed and are unchanged from prior visit.  Current Outpatient Medications  Medication Sig Dispense Refill  . allopurinol (ZYLOPRIM) 100 MG tablet Take 1 tablet (100 mg total) by mouth every other day. Post-dialysis 30 tablet 6  . aspirin 81 MG tablet Take 1 tablet (81 mg total) by mouth daily. 30 tablet   . atorvastatin (LIPITOR) 40 MG tablet Take 1 tablet (40 mg total) by mouth daily. 90 tablet 3  . B Complex-C-Folic Acid (DIALYVITE TABLET) TABS Take 1 tablet by mouth daily.  12  . calcium acetate (PHOSLO) 667 MG capsule TAKE 3 CAPSULES (2,001 MG TOTAL) BY MOUTH 3 (THREE) TIMES DAILY WITH MEALS. 90 capsule 0  . escitalopram (LEXAPRO) 10 MG tablet Take 1 tablet (10 mg total) by mouth daily. 30 tablet 0  . hydrALAZINE (APRESOLINE) 50 MG tablet Take 1 tablet (50 mg total) by mouth 3 (three) times daily. 90 tablet 1  . Insulin Glargine (BASAGLAR KWIKPEN) 100 UNIT/ML SOPN Inject 0.1 mLs (10 Units total) into the skin every morning. 15 mL 3  . isosorbide mononitrate (IMDUR) 30 MG 24 hr tablet Take 0.5 tablets (15 mg total) by mouth daily. 30 tablet 0  . lidocaine-prilocaine (EMLA) cream APPLY SMALL AMOUNT TO ACCESS SITE (AVF) 1 TO 2 HOURS BEFORE DIALYSIS. COVER WITH OCCLUSIVE DRESSING (SARAN WRAP)  12  . liraglutide (VICTOZA) 18 MG/3ML SOPN Inject 1.2 mg once daily; if your  blood glucose is greater than 140 mg/dL, may increase to 1.8 mg once daily. 9 mL 3  . losartan (COZAAR) 50 MG tablet TAKE 1 TABLET BY MOUTH EVERYDAY AT BEDTIME 90 tablet 3  . multivitamin (RENA-VIT) TABS tablet TAKE 1 TABLET BY MOUTH EVERYDAY AT BEDTIME 90 tablet 3  . NARCAN 4 MG/0.1ML LIQD nasal spray kit Place 1 spray into the nose once.     Marland Kitchen oxyCODONE-acetaminophen (ROXICET) 5-325 MG tablet Take 1 tablet by mouth every 8 (eight) hours as needed for severe pain. 90 tablet 0  . polyethylene glycol (MIRALAX / GLYCOLAX) packet Take 17 g by mouth daily as needed. 30 each 3  . tamsulosin (FLOMAX) 0.4 MG CAPS capsule Take 1 capsule (0.4 mg total) by mouth daily after breakfast. 30 capsule 0  . traZODone (DESYREL) 50 MG tablet Take 1 tablet (50 mg total) by mouth at bedtime as needed for sleep. 90 tablet 3   No current facility-administered medications for this visit.     On ROS today: 10 system ROS is negative unless otherwise noted in HPI   Physical Examination   Vitals:   10/03/17 1522  BP: 104/60  Pulse: 62  Resp: 14  Temp: 98 F (36.7 C)  TempSrc: Oral  SpO2: 98%  Weight: 156 lb (70.8 kg)  Height: '5\' 4"'  (1.626 m)   Body mass index is 26.78 kg/m.  General Alert, O x 3, WD, NAD  Pulmonary Sym exp, good B air movt,   Cardiac RRR, Nl S1, S2,   Vascular Vessel Right Left  Radial Palpable Palpable  Brachial Palpable Palpable  Ulnar Not palpable Not palpable   Palpable thrill and audible bruit over basilic vein in left arm to the level of the mid upper arm  Musculo- skeletal M/S 5/5 throughout  , Extremities without ischemic changes    Neurologic A&O; CN grossly intact     Non-invasive Vascular Imaging   left Arm Access Duplex:   Diameters: Ranging from 0.44 cm in the [facility] fossa to 0.9 cm in the proximal upper arm  Depth: Ranging from 0.6 and distal upper arm to 1.6 cm in proximal upper arm  Basilic vein appears to dump into brachial vein    Medical Decision Making     Joseph Mcdonald is a 67 y.o. male who presents with ESRD requiring hemodialysis.    Left arm brachial basilic fistula is patent with a palpable thrill and audible bruit  Based on fistula duplex, fistula has matured  Plan will be for second stage basilic vein transposition by Dr. Bridgett Larsson on a nondialysis day in the next week or so Risk, benefits, and alternatives to access surgery were discussed.   The patient is aware the risks include but are not limited to: bleeding, infection, steal syndrome, nerve damage, thrombosis, failure to mature, complications related to venous hypertension, need for additional procedures.  The patient agrees to proceed with surgery.  Dagoberto Ligas PA-C [facility] Office: [phone]

## 2017-10-06 ENCOUNTER — Other Ambulatory Visit: Payer: Self-pay | Admitting: Family Medicine

## 2017-10-07 ENCOUNTER — Other Ambulatory Visit: Payer: Self-pay | Admitting: Internal Medicine

## 2017-10-08 ENCOUNTER — Other Ambulatory Visit: Payer: Self-pay

## 2017-10-08 ENCOUNTER — Encounter: Payer: Self-pay | Admitting: Family Medicine

## 2017-10-08 ENCOUNTER — Other Ambulatory Visit: Payer: Self-pay | Admitting: *Deleted

## 2017-10-08 ENCOUNTER — Ambulatory Visit (INDEPENDENT_AMBULATORY_CARE_PROVIDER_SITE_OTHER): Payer: Medicare Other | Admitting: Family Medicine

## 2017-10-08 VITALS — BP 132/87 | HR 97 | Temp 98.7°F | Ht 64.0 in | Wt 154.0 lb

## 2017-10-08 DIAGNOSIS — F3289 Other specified depressive episodes: Secondary | ICD-10-CM

## 2017-10-08 DIAGNOSIS — E1122 Type 2 diabetes mellitus with diabetic chronic kidney disease: Secondary | ICD-10-CM

## 2017-10-08 DIAGNOSIS — N184 Chronic kidney disease, stage 4 (severe): Secondary | ICD-10-CM | POA: Diagnosis not present

## 2017-10-08 LAB — POCT GLYCOSYLATED HEMOGLOBIN (HGB A1C): HbA1c, POC (controlled diabetic range): 6.7 % (ref 0.0–7.0)

## 2017-10-08 MED ORDER — ESCITALOPRAM OXALATE 20 MG PO TABS: 20.0000 mg | ORAL_TABLET | Freq: Every day | ORAL | 0 refills | Status: AC

## 2017-10-08 NOTE — Progress Notes (Signed)
HM ROI completed to [facility] for Retinal eye exam notes. Original placed in to be scanned pile and copy placed in HM request box to be called / requested. Fleeger, Salome Spotted, CMA

## 2017-10-08 NOTE — Progress Notes (Signed)
   CC: meet new PCP, DM, anxiety  HPI  DM - doesn't have meter with him. CBGs at home 101-108 before eating. Sometimes feels shaky, however he and wife state this is chronic. Never sees numbers below 100 fasting.   Anxiety - doesn't sleep well, stays to himself. Tried other SSRIs but can't recall symptoms. States xanax worked well in the past. Wants to change or increase medicine. No SI or HI.  ROS: Denies CP, SOB, abdominal pain, dysuria, changes in BMs.   CC, SH/smoking status, and VS noted  Objective: BP 132/87   Pulse 97   Temp 98.7 F (37.1 C) (Oral)   Ht 5\' 4"  (1.626 m)   Wt 154 lb (69.9 kg)   SpO2 98%   BMI 26.43 kg/m  Gen: NAD, alert, cooperative, and pleasant. HEENT: NCAT, EOMI, PERRL CV: RRR, no murmur Resp: CTAB, no wheezes, non-labored Ext: No edema, warm Neuro: Alert and oriented, Speech clear, No gross deficits  Assessment and plan:  Type 2 diabetes mellitus with stage 4 chronic kidney disease ([place]) a1c improved to 6.7, however patient states no hypoglycemic episodes.  Will continue current regimen, but will have low threshold to decrease his insulin or Victoza if sugars go lower.  He will call if he has any symptoms.  Depression Currently on 10 mg of Lexapro, states this is not working for him, PHQ 9 20 and GAD 7 20.  States Xanax was helpful in the past.  Counseled patient that it is not safe for him to be on chronic pain medicine and benzodiazepines and these are not long-term treatment for anxiety.  Will increase Lexapro to 20 mg.  If this is resistant can also refer to psych.  Follow-up in 1 month.   Orders Placed This Encounter  Procedures  . POCT glycosylated hemoglobin (Hb A1C)    Meds ordered this encounter  Medications  . escitalopram (LEXAPRO) 20 MG tablet    Sig: Take 1 tablet (20 mg total) by mouth daily.    Dispense:  90 tablet    Refill:  0     Ralene Ok, MD, PGY3 10/09/2017 2:55 PM

## 2017-10-08 NOTE — Patient Instructions (Signed)
It was a pleasure to see you today! Thank you for choosing [facility] for your primary care. Joseph Mcdonald was seen for mood, diabetes.   Our plans for today were:  Keep your diabetes medicines the same, but please call me if you notice numbers below 100 or feel more shaky.   Increase your lexapro to 20mg .   You should return to our clinic to see Dr. Lindell Noe in 1 month for mood.   Best,  Dr. Lindell Noe

## 2017-10-09 NOTE — Assessment & Plan Note (Signed)
a1c improved to 6.7, however patient states no hypoglycemic episodes.  Will continue current regimen, but will have low threshold to decrease his insulin or Victoza if sugars go lower.  He will call if he has any symptoms.

## 2017-10-09 NOTE — Assessment & Plan Note (Addendum)
Currently on 10 mg of Lexapro, states this is not working for him, PHQ 9 20 and GAD 7 20.  States Xanax was helpful in the past.  Counseled patient that it is not safe for him to be on chronic pain medicine and benzodiazepines and these are not long-term treatment for anxiety.  Will increase Lexapro to 20 mg.  If this is resistant can also refer to psych.  Follow-up in 1 month.

## 2017-10-10 ENCOUNTER — Telehealth: Payer: Self-pay | Admitting: Adult Health

## 2017-10-10 NOTE — Telephone Encounter (Signed)
Received records from [place] on 10/09/17, Appt 10/22/17 @ 3:00PM. [place]

## 2017-10-13 ENCOUNTER — Ambulatory Visit (HOSPITAL_COMMUNITY): Admission: RE | Admit: 2017-10-13 | Payer: Medicare Other | Source: Ambulatory Visit | Admitting: Vascular Surgery

## 2017-10-13 ENCOUNTER — Encounter (HOSPITAL_COMMUNITY): Admission: RE | Payer: Self-pay | Source: Ambulatory Visit

## 2017-10-13 SURGERY — TRANSPOSITION, VEIN, BASILIC
Anesthesia: Monitor Anesthesia Care | Laterality: Left

## 2017-10-15 ENCOUNTER — Ambulatory Visit: Payer: Medicare Other | Admitting: Physician Assistant

## 2017-10-16 ENCOUNTER — Other Ambulatory Visit: Payer: Self-pay

## 2017-10-16 NOTE — Telephone Encounter (Signed)
Please call patient, his nephrologist should be prescribing his calcium binders and he can call there for refills.

## 2017-10-17 NOTE — Telephone Encounter (Signed)
LVM to call office to inform him of below. Katharina Caper, April D, [place]

## 2017-10-19 ENCOUNTER — Other Ambulatory Visit: Payer: Self-pay | Admitting: Internal Medicine

## 2017-10-21 ENCOUNTER — Other Ambulatory Visit: Payer: Self-pay | Admitting: *Deleted

## 2017-10-21 DIAGNOSIS — M5440 Lumbago with sciatica, unspecified side: Principal | ICD-10-CM

## 2017-10-21 DIAGNOSIS — G8929 Other chronic pain: Secondary | ICD-10-CM

## 2017-10-21 NOTE — Progress Notes (Signed)
Cardiology Office Note   Date:  10/22/2017   ID:  Mcdonald, Joseph [birth year], MRN [id number]  PCP:  Sela Hilding, MD  Cardiologist: Dr. Wyatt Haste Chief Complaint  Patient presents with  . Hospitalization Follow-up  . Coronary Artery Disease     History of Present Illness: Joseph Mcdonald is a 67 y.o. male who presents for ongoing assessment and management of coronary artery disease, history of CABG (LIMA to LAD, SVG to PDA, SVG to ramus and OM1.    The patient was last seen during hospitalization in May 2019 due to a troponin elevation.  The patient had a cardiac catheterization at that time which revealed patent grafts with the exception of an occluded SVG to ramus and OM1 with plans for medical treatment and no stents.  The patient was treated with nitrates.  Beta-blocker was not initiated due to bradycardia.   The patient underwent fistula surgery by Dr. Bridgett Larsson in May 2019.  The patient was continued on hydralazine and Darvocet for anemia in the setting of chronic renal insufficiency.  The patient has a IJ tunneled dialysis catheter and a left first stage basilic vein transposition.  The patient was followed in vein and vascular's office on 10/03/2017 with incision of his left arm well-healed.   He comes today without cardiac complaints. He denies chest pain, dyspnea, or fluid retention. He wishes to be followed in [place] when that office opens as this is closer to his home.   Past Medical History:  Diagnosis Date  . Anxiety   . Depression   . Diabetes mellitus without complication ([place])   . Hypertension   . Peripheral neuropathy   . Renal disorder     Past Surgical History:  Procedure Laterality Date  . BASCILIC VEIN TRANSPOSITION Left 08/08/2017   Procedure: BASILIC VEIN TRANSPOSITION LEFT;  Surgeon: Conrad [place], MD;  Location: [place] [facility];  Service: Vascular;  Laterality: Left;  . CARDIAC SURGERY    . CORONARY ARTERY BYPASS GRAFT    . INSERTION OF  DIALYSIS CATHETER N/A 08/08/2017   Procedure: INSERTION OF TUNNELED  DIALYSIS CATHETER;  Surgeon: Conrad [place], MD;  Location: [place];  Service: Vascular;  Laterality: N/A;  . LEFT HEART CATH AND CORS/GRAFTS ANGIOGRAPHY N/A 08/05/2017   Procedure: LEFT HEART CATH AND CORS/GRAFTS ANGIOGRAPHY;  Surgeon: Martinique, Peter M, MD;  Location: [place] [facility];  Service: Cardiovascular;  Laterality: N/A;     Current Outpatient Medications  Medication Sig Dispense Refill  . allopurinol (ZYLOPRIM) 100 MG tablet Take 1 tablet (100 mg total) by mouth every other day. Post-dialysis 30 tablet 6  . aspirin 81 MG tablet Take 1 tablet (81 mg total) by mouth daily. 30 tablet   . atorvastatin (LIPITOR) 40 MG tablet Take 1 tablet (40 mg total) by mouth daily. 90 tablet 1  . B Complex-C-Folic Acid (DIALYVITE TABLET) TABS Take 1 tablet by mouth daily.  12  . calcium acetate (PHOSLO) 667 MG capsule TAKE 3 CAPSULES (2,001 MG TOTAL) BY MOUTH 3 (THREE) TIMES DAILY WITH MEALS. 90 capsule 0  . escitalopram (LEXAPRO) 20 MG tablet Take 1 tablet (20 mg total) by mouth daily. 90 tablet 0  . hydrALAZINE (APRESOLINE) 50 MG tablet Take 1 tablet (50 mg total) by mouth 3 (three) times daily. 90 tablet 1  . Insulin Glargine (BASAGLAR KWIKPEN) 100 UNIT/ML SOPN Inject 0.1 mLs (10 Units total) into the skin every morning. 15 mL 3  . isosorbide mononitrate (IMDUR) 30 MG 24  hr tablet Take 0.5 tablets (15 mg total) by mouth daily. 15 tablet 1  . liraglutide (VICTOZA) 18 MG/3ML SOPN Inject 1.2 mg once daily; if your blood glucose is greater than 140 mg/dL, may increase to 1.8 mg once daily. 9 mL 3  . losartan (COZAAR) 50 MG tablet TAKE 1 TABLET BY MOUTH EVERYDAY AT BEDTIME 90 tablet 1  . multivitamin (RENA-VIT) TABS tablet TAKE 1 TABLET BY MOUTH EVERYDAY AT BEDTIME 90 tablet 3  . NARCAN 4 MG/0.1ML LIQD nasal spray kit Place 1 spray into the nose once.     Marland Kitchen oxyCODONE-acetaminophen (ROXICET) 5-325 MG tablet Take 1 tablet by mouth every 8  (eight) hours as needed for severe pain. 90 tablet 0  . polyethylene glycol (MIRALAX / GLYCOLAX) packet Take 17 g by mouth daily as needed. 30 each 3  . tamsulosin (FLOMAX) 0.4 MG CAPS capsule TAKE 1 CAPSULE (0.4 MG TOTAL) BY MOUTH DAILY AFTER BREAKFAST. 30 capsule 0  . traZODone (DESYREL) 50 MG tablet Take 1 tablet (50 mg total) by mouth at bedtime as needed for sleep. 90 tablet 3  . lidocaine-prilocaine (EMLA) cream APPLY SMALL AMOUNT TO ACCESS SITE (AVF) 1 TO 2 HOURS BEFORE DIALYSIS. COVER WITH OCCLUSIVE DRESSING (SARAN WRAP)  12   No current facility-administered medications for this visit.     Allergies:   Penicillins    Social History:  The patient  reports that he has never smoked. He has never used smokeless tobacco. He reports that he does not drink alcohol or use drugs.   Family History:  The patient's family history includes Congestive Heart Failure in his mother; Diabetes in his father and mother; Heart attack in his brother and brother; Heart disease in his brother, brother, and mother.    ROS: All other systems are reviewed and negative. Unless otherwise mentioned in H&P    PHYSICAL EXAM: VS:  BP 128/60 (BP Location: Right Arm, Patient Position: Sitting)   Pulse (!) 55   Ht '5\' 4"'$  (1.626 m)   Wt 164 lb (74.4 kg)   BMI 28.15 kg/m  , BMI Body mass index is 28.15 kg/m. GEN: Well nourished, well developed, in no acute distress  HEENT: normal  Neck: no JVD, carotid bruits, or masses Cardiac: JJK;0/9 systolic  murmurs, rubs, or gallops,no edema  Respiratory:  clear to auscultation bilaterally, normal work of breathing GI: soft, nontender, nondistended, + BS MS: no deformity or atrophy Right subclavian dialysis catheter noted.  Left brachial access begun.  Skin: warm and dry, no rash Neuro:  Strength and sensation are intact Psych: euthymic mood, full affect   EKG: Sinus bradycardia with 1st degree AV block with QTc of 482.    Recent Labs: 07/30/2017: B Natriuretic  Peptide 4,146.3 08/08/2017: ALT 144; BUN 49; Creatinine, Ser 4.32; Hemoglobin 8.5; Platelets 186; Potassium 4.0; Sodium 135    Lipid Panel    Component Value Date/Time   CHOL 180 12/25/2015 1429   TRIG 255 (H) 12/25/2015 1429   HDL 48 12/25/2015 1429   CHOLHDL 3.8 12/25/2015 1429   VLDL 51 (H) 12/25/2015 1429   LDLCALC 81 12/25/2015 1429      Wt Readings from Last 3 Encounters:  10/22/17 164 lb (74.4 kg)  10/08/17 154 lb (69.9 kg)  10/03/17 156 lb (70.8 kg)      Other studies Reviewed: Left ventricle: The cavity size was normal. Wall thickness was   increased in a pattern of mild LVH. Systolic function was   moderately reduced. The  estimated ejection fraction was in the   range of 35% to 40%. Diffuse hypokinesis. The study is not   technically sufficient to allow evaluation of LV diastolic   function. - Aortic valve: Sclerosis without stenosis. There was no   regurgitation. Mean gradient (S): 8 mm Hg. - Mitral valve: Mildly thickened leaflets . There was mild   regurgitation. - Right ventricle: The cavity size was mildly dilated. Systolic   function was normal. - Right atrium: Severely dilated. - Tricuspid valve: There was moderate regurgitation. - Pulmonary arteries: PA peak pressure: 37 mm Hg (S). - Inferior vena cava: The vessel was dilated. The respirophasic   diameter changes were blunted (< 50%), consistent with elevated   central venous pressure.  Impressions:  - Compared to a prior study in 2017, the LVEF is lower at 35-40%   with global hypokinesis, elevated LV and RV filling pressure and   severe RAE.  Cardiac Cath Conclusion     Ost LAD to Prox LAD lesion is 70% stenosed.  Prox LAD lesion is 25% stenosed.  Mid LAD lesion is 100% stenosed.  Ost 1st Diag lesion is 90% stenosed.  Ost 2nd Diag to 2nd Diag lesion is 95% stenosed.  Mid LAD to Dist LAD lesion is 75% stenosed.  Ost Ramus to Ramus lesion is 100% stenosed.  Prox RCA lesion is 90%  stenosed.  Prox RCA to Mid RCA lesion is 99% stenosed.  Dist RCA-1 lesion is 95% stenosed.  Dist RCA-2 lesion is 100% stenosed.  Seq SVG- PDA and PLOM graft was visualized by angiography and is normal in caliber.  The graft exhibits no disease.  Seq SVG- ramus intermediate and OM1 graft was visualized by angiography.  Origin to Prox Graft lesion before Ramus is 100% stenosed.  LIMA graft was visualized by angiography and is normal in caliber.  The graft exhibits no disease.  LV end diastolic pressure is mildly elevated.   Cardiac Cath 08/05/2017 1. Severe 3 vessel occlusive CAD.     - 100% mid LAD after the second diagonal. The distal LAD is diffusely diseased to 75%.     - 90% ostial small first diagonal    - 95% ostial small second diagonal    - 100% ramus intermediate    - 100% LCx    - 100% RCA 2. Patent LIMA to the LAD 3. Occluded SVG to the ramus intermediate and OM1. There are right to left collaterals to these vessels 4. Patent SVG to PDA/PLOM 5. Mildly elevated LVEDP  Plan: medical management.     ASSESSMENT AND PLAN:  1. CAD: Hx of CABG with recent cardiac cath with severe native vessel CAD, patent LIMA to LAD, occluded SVG to ramus intermedius and OM1, with right to left collaterals to these vessels. Patent SVG to PDA.PLOM. He is doing well. Not on BB due to bradycardia. Remains on ASA.   2. HFrEF: EF of 35%-40%. On dialysis for volume management. Continues on losartan. Consider adding Entresto, but with ESRD may not be advantages.   3. Hypercholesterolemia: Continue statin therapy. Goal of LDL <70 Most recent studies available on 12/25/2015 LDL 81, TC 180. TG 255.   4. ESRD: Undergoing staged fistula placement per Dr. Bridgett Larsson.   Current medicines are reviewed at length with the patient today.    Labs/ tests ordered today include: None  Phill Myron. [place] Pugh, ANP, AACC   10/22/2017 3:45 PM    [facility] [address],  [place], [place] [address] Phone: [phone]; Fax: [phone]

## 2017-10-22 ENCOUNTER — Ambulatory Visit (INDEPENDENT_AMBULATORY_CARE_PROVIDER_SITE_OTHER): Payer: Medicare Other | Admitting: Adult Health

## 2017-10-22 ENCOUNTER — Encounter: Payer: Self-pay | Admitting: Adult Health

## 2017-10-22 VITALS — BP 128/60 | HR 55 | Ht 64.0 in | Wt 164.0 lb

## 2017-10-22 DIAGNOSIS — I2510 Atherosclerotic heart disease of native coronary artery without angina pectoris: Secondary | ICD-10-CM | POA: Diagnosis not present

## 2017-10-22 DIAGNOSIS — E7800 Pure hypercholesterolemia, unspecified: Secondary | ICD-10-CM | POA: Diagnosis not present

## 2017-10-22 DIAGNOSIS — N186 End stage renal disease: Secondary | ICD-10-CM

## 2017-10-22 DIAGNOSIS — I5022 Chronic systolic (congestive) heart failure: Secondary | ICD-10-CM | POA: Diagnosis not present

## 2017-10-22 DIAGNOSIS — I10 Essential (primary) hypertension: Secondary | ICD-10-CM

## 2017-10-22 DIAGNOSIS — Z992 Dependence on renal dialysis: Secondary | ICD-10-CM

## 2017-10-22 MED ORDER — ATORVASTATIN CALCIUM 40 MG PO TABS: 40.0000 mg | ORAL_TABLET | Freq: Every day | ORAL | 1 refills | Status: AC

## 2017-10-22 MED ORDER — HYDRALAZINE HCL 50 MG PO TABS: 50.0000 mg | ORAL_TABLET | Freq: Three times a day (TID) | ORAL | 1 refills | Status: AC

## 2017-10-22 MED ORDER — OXYCODONE-ACETAMINOPHEN 5-325 MG PO TABS: 1.0000 | ORAL_TABLET | Freq: Three times a day (TID) | ORAL | 0 refills | Status: AC | PRN

## 2017-10-22 MED ORDER — ISOSORBIDE MONONITRATE ER 30 MG PO TB24: 15.0000 mg | ORAL_TABLET | Freq: Every day | ORAL | 1 refills | Status: AC

## 2017-10-22 MED ORDER — LOSARTAN POTASSIUM 50 MG PO TABS: ORAL_TABLET | ORAL | 1 refills | Status: AC

## 2017-10-22 NOTE — Patient Instructions (Signed)
Medication Instructions:  NO CHANGES- Your physician recommends that you continue on your current medications as directed. Please refer to the Current Medication list given to you today.  If you need a refill on your cardiac medications before your next appointment, please call your pharmacy.  Follow-Up: Your physician wants you to follow-up in: [place]. You should receive a reminder letter in the mail two months in advance. If you do not receive a letter, please call our office SEPT 2019 to schedule the NOV 2019 follow-up appointment.   Thank you for choosing [facility] at [facility]!!

## 2017-10-23 ENCOUNTER — Other Ambulatory Visit: Payer: Self-pay

## 2017-10-23 ENCOUNTER — Encounter (HOSPITAL_COMMUNITY): Payer: Self-pay | Admitting: *Deleted

## 2017-10-23 ENCOUNTER — Encounter: Payer: Self-pay | Admitting: Family Medicine

## 2017-10-23 MED ORDER — SODIUM CHLORIDE 0.9 % IV SOLN: 100.0000 mL | INTRAVENOUS | Status: DC | PRN

## 2017-10-23 MED ORDER — LIDOCAINE HCL (PF) 1 % IJ SOLN: 5.0000 mL | INTRAMUSCULAR | Status: DC | PRN

## 2017-10-23 MED ORDER — ALTEPLASE 2 MG IJ SOLR: 2.0000 mg | Freq: Once | INTRAMUSCULAR | Status: DC | PRN

## 2017-10-23 MED ORDER — HEPARIN SODIUM (PORCINE) 1000 UNIT/ML DIALYSIS: 1000.0000 [IU] | INTRAMUSCULAR | Status: DC | PRN

## 2017-10-23 MED ORDER — LIDOCAINE-PRILOCAINE 2.5-2.5 % EX CREA: 1.0000 "application " | TOPICAL_CREAM | CUTANEOUS | Status: DC | PRN

## 2017-10-23 MED ORDER — PENTAFLUOROPROP-TETRAFLUOROETH EX AERO: 1.0000 "application " | INHALATION_SPRAY | CUTANEOUS | Status: DC | PRN

## 2017-10-24 ENCOUNTER — Other Ambulatory Visit: Payer: Self-pay | Admitting: *Deleted

## 2017-10-24 ENCOUNTER — Telehealth: Payer: Self-pay

## 2017-10-24 NOTE — Telephone Encounter (Signed)
Cipriano Mile, with the [facility] Department, called nurse line requesting to speak with PCP, to inform of pts death last night. He tried calling the after hours line and did not receive a call back. Please call him at [phone].

## 2017-10-24 NOTE — Telephone Encounter (Signed)
I spoke with Lt. Koleen Nimrod of the [employer] Patient last seen alive by around 6:30 am when wife was leaving how, when she returned a couple hours later, Mr Chapdelaine was dead on the sofa.   [place] investigated the scene of death and did not believe there was evidence of non-natural death.  I accepted responsibility for signing the death certificate.  Leary Roca requested that we fax hime a copy of the Death Certificate.  I said we would. His fax [phone]

## 2017-10-27 ENCOUNTER — Ambulatory Visit (HOSPITAL_COMMUNITY): Admission: RE | Admit: 2017-10-27 | Payer: Medicare Other | Source: Ambulatory Visit | Admitting: Vascular Surgery

## 2017-10-27 HISTORY — DX: Unspecified osteoarthritis, unspecified site: M19.90

## 2017-10-27 HISTORY — DX: Atherosclerotic heart disease of native coronary artery without angina pectoris: I25.10

## 2017-10-27 HISTORY — DX: Family history of other specified conditions: Z84.89

## 2017-10-27 HISTORY — DX: Other complications of anesthesia, initial encounter: T88.59XA

## 2017-10-27 HISTORY — DX: End stage renal disease: N18.6

## 2017-10-27 HISTORY — DX: Malignant (primary) neoplasm, unspecified: C80.1

## 2017-10-27 HISTORY — DX: Adverse effect of unspecified anesthetic, initial encounter: T41.45XA

## 2017-10-27 HISTORY — DX: Tremor, unspecified: R25.1

## 2017-10-27 SURGERY — TRANSPOSITION, VEIN, BASILIC
Anesthesia: Monitor Anesthesia Care | Laterality: Left

## 2017-10-29 ENCOUNTER — Telehealth: Payer: Self-pay | Admitting: Family Medicine

## 2017-10-29 NOTE — Telephone Encounter (Signed)
Death Certificate was dropped off and has been placed in PCP's box for completion. Please use blue or black ink.  Please no white out, strike throughs, or highlite.  Please return to [place] once completed. Family needs this asap please, thank you.

## 2017-10-30 NOTE — Telephone Encounter (Signed)
Completed death certificate. Lynette, please grab it from the top of my inbox. I didn't want to leave it out and came to do this while on night shift. Thanks.

## 2017-11-14 ENCOUNTER — Ambulatory Visit: Payer: Medicare Other | Admitting: Family Medicine

## 2017-11-23 DEATH — deceased

## 2018-10-13 IMAGING — CT CT ABD-PELV W/ CM
2 of 5 series · 16 of 46 positions shown, 18 images · IV contrast (Omni 300)
Comparison: Complete abdomen ultrasound dated 07/30/2017. Abdomen
and pelvis CT dated 12/26/2015.

CLINICAL DATA: Abdominal distention.

EXAM:
CT ABDOMEN AND PELVIS WITH CONTRAST
TECHNIQUE: Multidetector CT imaging of the abdomen and pelvis was performed
using the standard protocol following bolus administration of
intravenous contrast.
CONTRAST:  100mL OMNIPAQUE IOHEXOL 300 MG/ML  SOLN

[Series 3: a/p w/ 5mm · axial · 0.82mm/px · z∈[+943,+1343]mm · 13 of 90 slices shown, 15 images]
[im 5/90  soft-tissue]
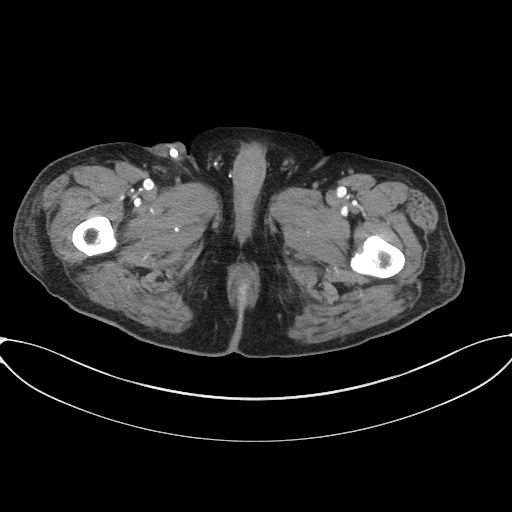
[im 5/90  bone]
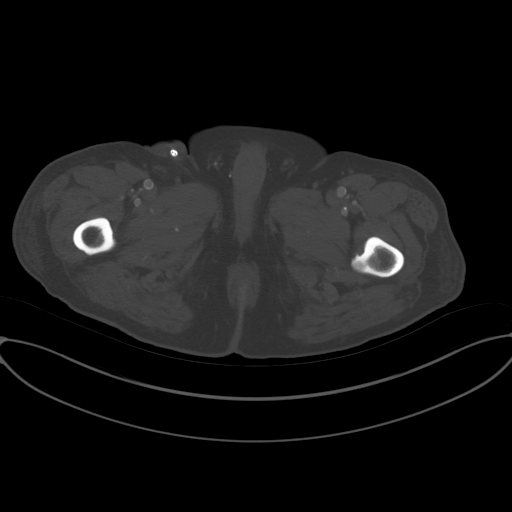
[im 14/90  soft-tissue]
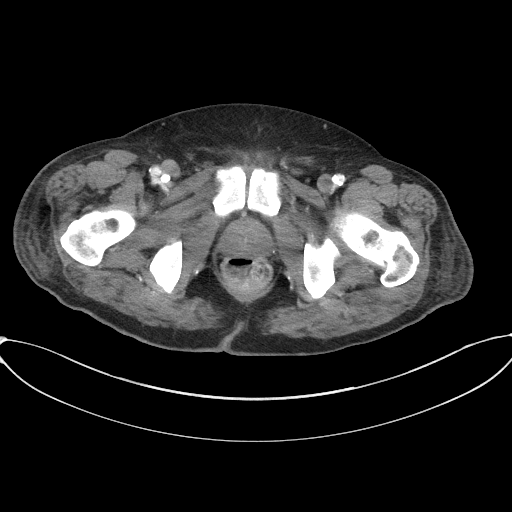
[im 18/90  soft-tissue]
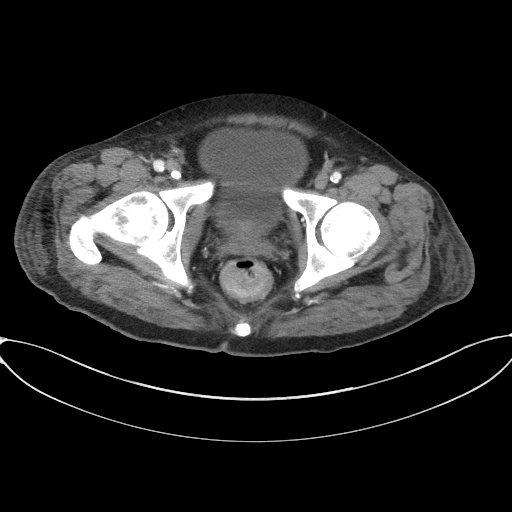
[im 27/90  soft-tissue]
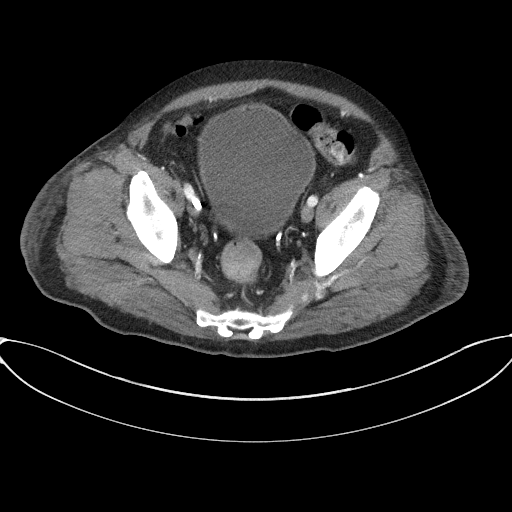
[im 32/90  soft-tissue]
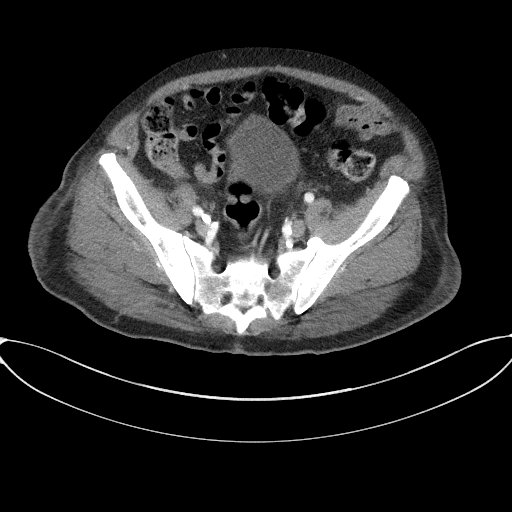
[im 41/90  soft-tissue]
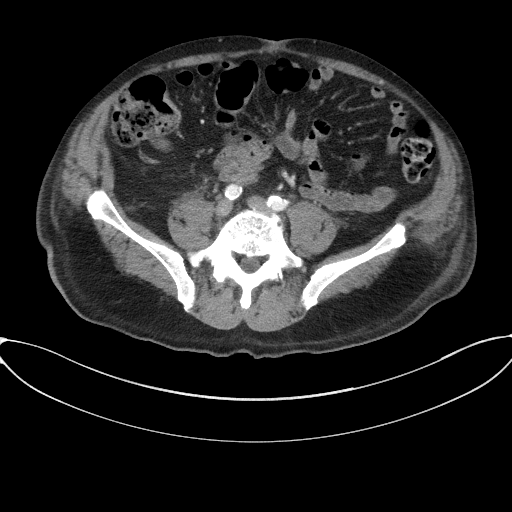
[im 45/90  soft-tissue]
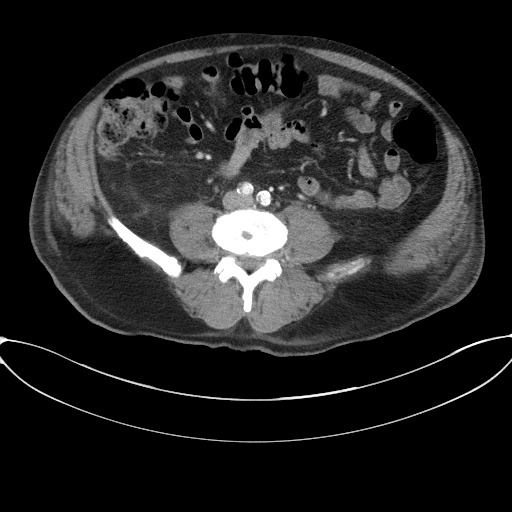
[im 49/90  soft-tissue]
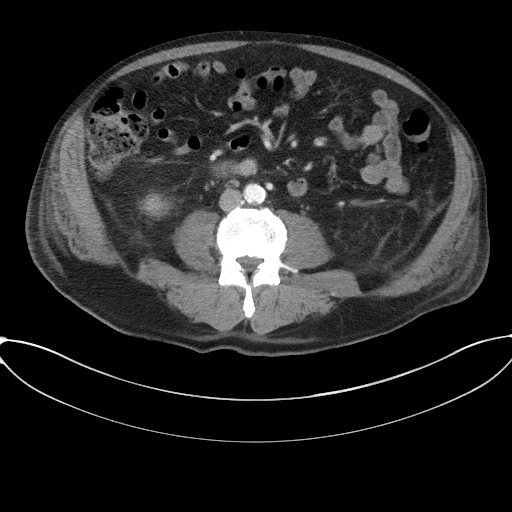
[im 58/90  soft-tissue]
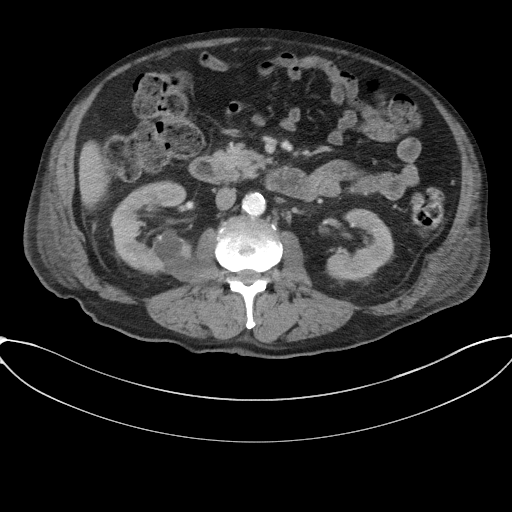
[im 58/90  bone]
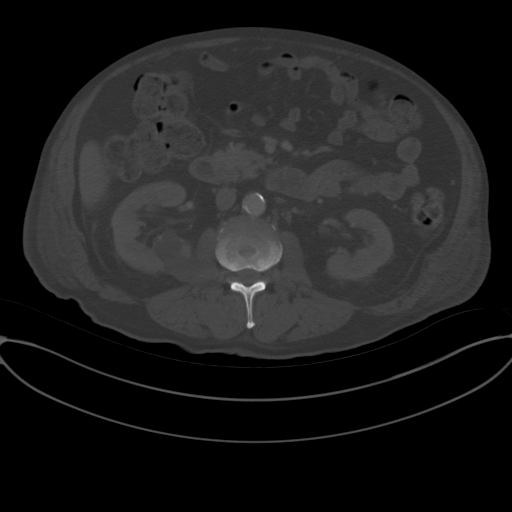
[im 63/90  soft-tissue]
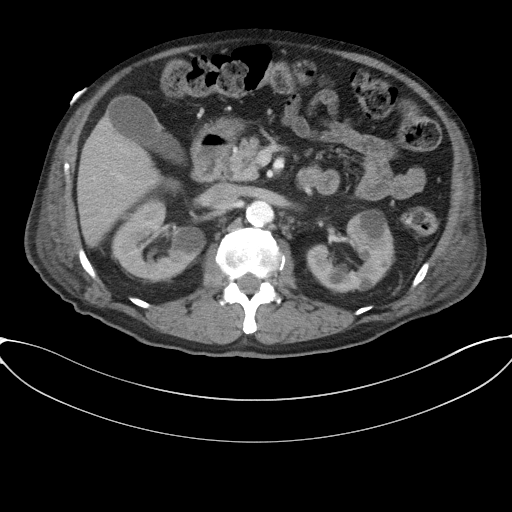
[im 72/90  soft-tissue]
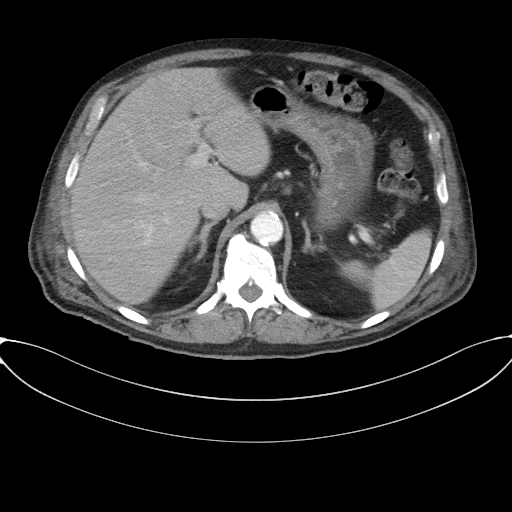
[im 76/90  soft-tissue]
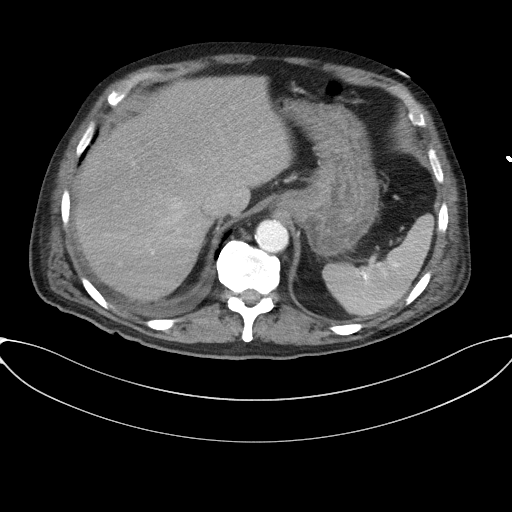
[im 85/90  soft-tissue]
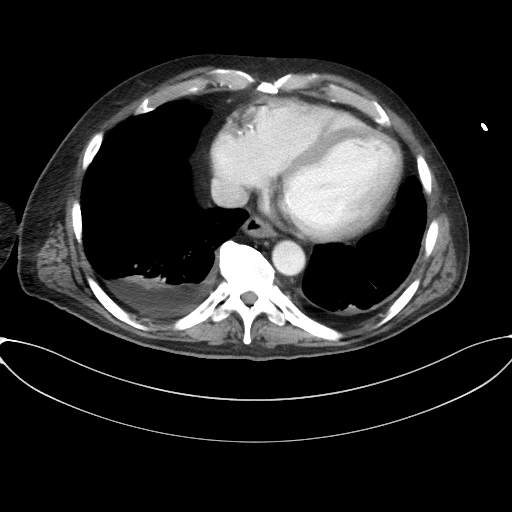

[Series 6: a/p w/ cor · coronal · 0.87mm/px · 3 of 178 slices shown]
[im 60/178  soft-tissue]
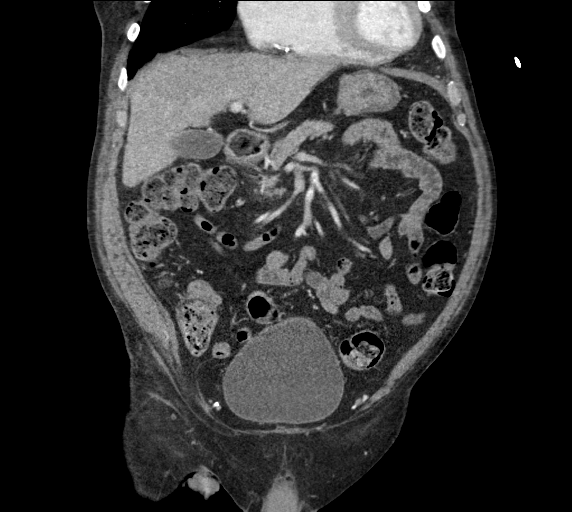
[im 79/178  soft-tissue]
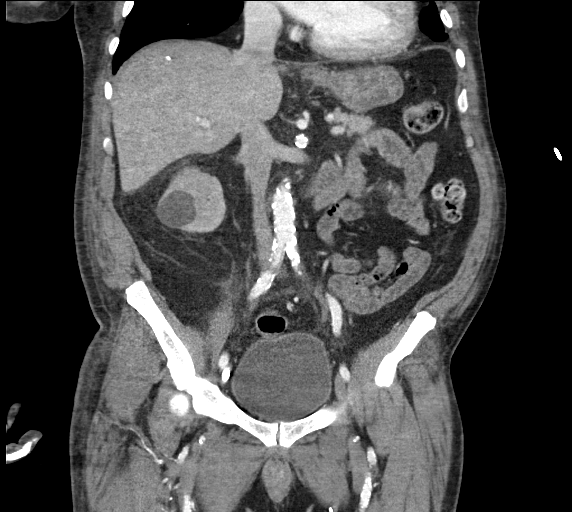
[im 99/178  soft-tissue]
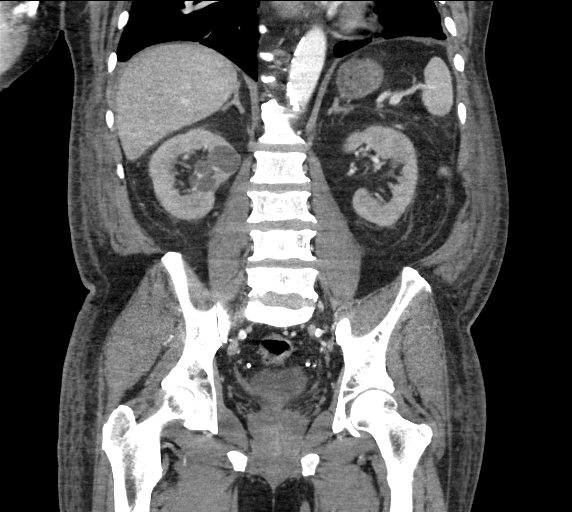

[16 of 46 positions shown; findings below may reference images not displayed]

FINDINGS: Lower chest: Enlarged heart. Small right pleural effusion and
minimal left pleural effusion. Mild bilateral lower lobe dependent
atelectasis.

Hepatobiliary: Small calcified granulomata in the liver. Mild
diffuse low density of the liver. Normal appearing gallbladder.

Pancreas: Unremarkable. No pancreatic ductal dilatation or
surrounding inflammatory changes.

Spleen: Normal in size without focal abnormality.

Adrenals/Urinary Tract: Normal appearing adrenal glands. Multiple
bilateral renal cysts. Small amount of air in the urinary bladder.
Otherwise, normal appearing urinary bladder and ureters. No urinary
tract calculi or hydronephrosis.

Stomach/Bowel: Stomach is within normal limits. Appendix appears
normal. No evidence of bowel wall thickening, distention, or
inflammatory changes.

Vascular/Lymphatic: Atheromatous arterial calcifications without
aneurysm. These include the coronary arteries. No enlarged lymph
nodes.

Reproductive: Mildly enlarged prostate gland.

Other: No abdominal wall hernia or abnormality. No abdominopelvic
ascites.

Musculoskeletal: Lumbar and lower thoracic spine degenerative
changes.
IMPRESSION: 1. Small right pleural effusion and minimal left pleural effusion.
2. Mild bilateral lower lobe dependent atelectasis.
3. Cardiomegaly.
4. Mild diffuse hepatic steatosis.
5. Small amount of air in the urinary bladder, most likely due to
recent catheterization.
6.  Calcific coronary artery and aortic atherosclerosis.
7. Mild prostatic hypertrophy.

## 2018-10-20 IMAGING — CR DG CHEST 1V PORT
1 series · 1 of 1 positions shown · non-contrast
Comparison: Chest x-ray dated July 30, 2017.

CLINICAL DATA: Dialysis catheter insertion.

EXAM:
PORTABLE CHEST 1 VIEW

[AP]
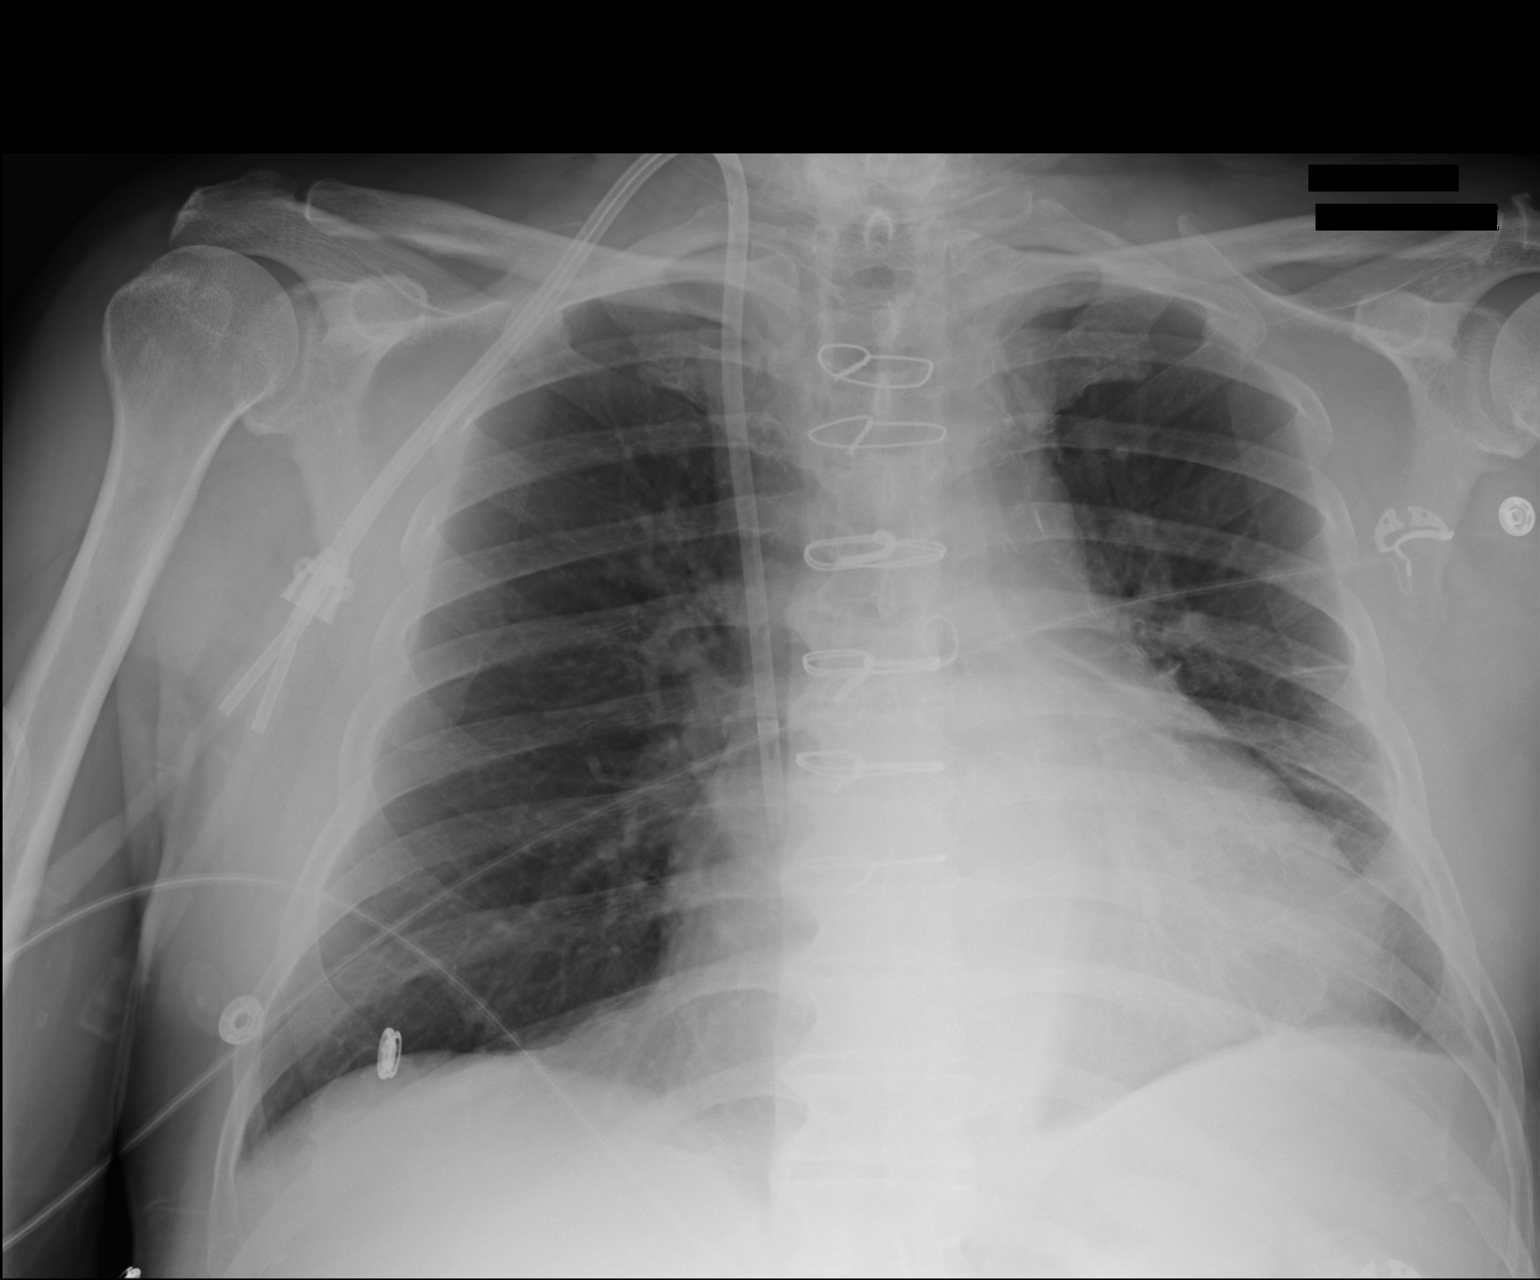

[1 of 1 positions shown; findings below may reference images not displayed]

FINDINGS: Interval placement of a tunneled right internal jugular dialysis
catheter with the tip near the cavoatrial junction. Stable
cardiomegaly status post CABG. Normal pulmonary vascularity.
Probable small left pleural effusion. No consolidation or
pneumothorax. No acute osseous abnormality.
IMPRESSION: 1. Stable cardiomegaly.  Probable small left pleural effusion.

## 2019-12-28 ENCOUNTER — Other Ambulatory Visit: Payer: Self-pay | Admitting: Hematology and Oncology
